# Patient Record
Sex: Male | Born: 1950 | Race: White | Hispanic: No | Marital: Married | Smoking: Never smoker
Health system: Southern US, Community
[De-identification: ages and names within clinical notes are randomized; demographics above are authoritative.]

## PROBLEM LIST (undated history)

## (undated) DIAGNOSIS — Z86010 Personal history of colonic polyps: Secondary | ICD-10-CM

## (undated) DIAGNOSIS — E119 Type 2 diabetes mellitus without complications: Secondary | ICD-10-CM

## (undated) DIAGNOSIS — I4891 Unspecified atrial fibrillation: Secondary | ICD-10-CM

## (undated) DIAGNOSIS — I2510 Atherosclerotic heart disease of native coronary artery without angina pectoris: Secondary | ICD-10-CM

## (undated) DIAGNOSIS — M1990 Unspecified osteoarthritis, unspecified site: Secondary | ICD-10-CM

## (undated) DIAGNOSIS — J301 Allergic rhinitis due to pollen: Secondary | ICD-10-CM

## (undated) DIAGNOSIS — Z860101 Personal history of adenomatous and serrated colon polyps: Secondary | ICD-10-CM

## (undated) DIAGNOSIS — K5790 Diverticulosis of intestine, part unspecified, without perforation or abscess without bleeding: Secondary | ICD-10-CM

## (undated) DIAGNOSIS — K648 Other hemorrhoids: Secondary | ICD-10-CM

## (undated) DIAGNOSIS — E669 Obesity, unspecified: Secondary | ICD-10-CM

## (undated) DIAGNOSIS — C801 Malignant (primary) neoplasm, unspecified: Secondary | ICD-10-CM

## (undated) DIAGNOSIS — I10 Essential (primary) hypertension: Secondary | ICD-10-CM

## (undated) DIAGNOSIS — E66812 Obesity, class 2: Secondary | ICD-10-CM

## (undated) DIAGNOSIS — E785 Hyperlipidemia, unspecified: Secondary | ICD-10-CM

## (undated) DIAGNOSIS — T7840XA Allergy, unspecified, initial encounter: Secondary | ICD-10-CM

## (undated) DIAGNOSIS — M170 Bilateral primary osteoarthritis of knee: Secondary | ICD-10-CM

## (undated) DIAGNOSIS — K219 Gastro-esophageal reflux disease without esophagitis: Secondary | ICD-10-CM

## (undated) DIAGNOSIS — Z85828 Personal history of other malignant neoplasm of skin: Secondary | ICD-10-CM

## (undated) DIAGNOSIS — N390 Urinary tract infection, site not specified: Secondary | ICD-10-CM

## (undated) HISTORY — PX: COLONOSCOPY: SHX174

## (undated) HISTORY — DX: Bilateral primary osteoarthritis of knee: M17.0

## (undated) HISTORY — PX: TRANSTHORACIC ECHOCARDIOGRAM: SHX275

## (undated) HISTORY — PX: POLYPECTOMY: SHX149

## (undated) HISTORY — DX: Allergic rhinitis due to pollen: J30.1

## (undated) HISTORY — DX: Atherosclerotic heart disease of native coronary artery without angina pectoris: I25.10

## (undated) HISTORY — DX: Unspecified atrial fibrillation: I48.91

## (undated) HISTORY — DX: Hyperlipidemia, unspecified: E78.5

## (undated) HISTORY — DX: Essential (primary) hypertension: I10

## (undated) HISTORY — DX: Unspecified osteoarthritis, unspecified site: M19.90

## (undated) HISTORY — DX: Gastro-esophageal reflux disease without esophagitis: K21.9

## (undated) HISTORY — DX: Obesity, class 2: E66.812

## (undated) HISTORY — PX: COLONOSCOPY W/ POLYPECTOMY: SHX1380

## (undated) HISTORY — DX: Malignant (primary) neoplasm, unspecified: C80.1

## (undated) HISTORY — DX: Urinary tract infection, site not specified: N39.0

## (undated) HISTORY — DX: Type 2 diabetes mellitus without complications: E11.9

## (undated) HISTORY — PX: OTHER SURGICAL HISTORY: SHX169

## (undated) HISTORY — DX: Obesity, unspecified: E66.9

## (undated) HISTORY — DX: Personal history of other malignant neoplasm of skin: Z85.828

## (undated) HISTORY — DX: Personal history of adenomatous and serrated colon polyps: Z86.0101

## (undated) HISTORY — DX: Other hemorrhoids: K64.8

## (undated) HISTORY — DX: Allergy, unspecified, initial encounter: T78.40XA

## (undated) HISTORY — DX: Personal history of colonic polyps: Z86.010

## (undated) HISTORY — PX: TONSILLECTOMY: SUR1361

## (undated) HISTORY — DX: Diverticulosis of intestine, part unspecified, without perforation or abscess without bleeding: K57.90

---

## 1999-03-01 ENCOUNTER — Encounter: Payer: Self-pay | Admitting: Family Medicine

## 1999-03-01 ENCOUNTER — Encounter: Admission: RE | Admit: 1999-03-01 | Discharge: 1999-03-01 | Payer: Self-pay | Admitting: Family Medicine

## 2004-02-15 ENCOUNTER — Ambulatory Visit: Payer: Self-pay | Admitting: Family Medicine

## 2004-08-23 ENCOUNTER — Ambulatory Visit: Payer: Self-pay | Admitting: Family Medicine

## 2004-09-05 ENCOUNTER — Ambulatory Visit: Payer: Self-pay | Admitting: Family Medicine

## 2004-09-08 ENCOUNTER — Ambulatory Visit: Payer: Self-pay

## 2005-03-02 ENCOUNTER — Emergency Department (HOSPITAL_COMMUNITY): Admission: EM | Admit: 2005-03-02 | Discharge: 2005-03-03 | Payer: Self-pay | Admitting: Emergency Medicine

## 2005-04-06 ENCOUNTER — Ambulatory Visit: Payer: Self-pay | Admitting: Internal Medicine

## 2006-04-20 ENCOUNTER — Ambulatory Visit: Payer: Self-pay | Admitting: Internal Medicine

## 2006-09-14 ENCOUNTER — Ambulatory Visit: Payer: Self-pay | Admitting: Internal Medicine

## 2006-10-01 ENCOUNTER — Ambulatory Visit: Payer: Self-pay | Admitting: Family Medicine

## 2006-10-01 DIAGNOSIS — K5732 Diverticulitis of large intestine without perforation or abscess without bleeding: Secondary | ICD-10-CM

## 2006-10-02 DIAGNOSIS — L851 Acquired keratosis [keratoderma] palmaris et plantaris: Secondary | ICD-10-CM

## 2006-10-21 ENCOUNTER — Ambulatory Visit: Payer: Self-pay | Admitting: Family Medicine

## 2006-10-21 LAB — CONVERTED CEMR LAB
Bilirubin Urine: NEGATIVE
Blood in Urine, dipstick: NEGATIVE
Glucose, Urine, Semiquant: NEGATIVE
Ketones, urine, test strip: NEGATIVE
Nitrite: NEGATIVE
Protein, U semiquant: NEGATIVE
Specific Gravity, Urine: >=1.03
Urobilinogen, UA: 0.2
WBC Urine, dipstick: NEGATIVE
pH: 5

## 2006-10-30 ENCOUNTER — Ambulatory Visit: Payer: Self-pay | Admitting: Family Medicine

## 2006-10-30 DIAGNOSIS — J309 Allergic rhinitis, unspecified: Secondary | ICD-10-CM | POA: Insufficient documentation

## 2006-10-30 LAB — CONVERTED CEMR LAB
ALT: 28 units/L (ref 0–53)
AST: 26 units/L (ref 0–37)
Albumin: 3.8 g/dL (ref 3.5–5.2)
Alkaline Phosphatase: 62 units/L (ref 39–117)
BUN: 17 mg/dL (ref 6–23)
Basophils Absolute: 0 10*3/uL (ref 0.0–0.1)
Basophils Relative: 0.8 % (ref 0.0–1.0)
Bilirubin, Direct: 0.1 mg/dL (ref 0.0–0.3)
CO2: 28 meq/L (ref 19–32)
Calcium: 9.2 mg/dL (ref 8.4–10.5)
Chloride: 109 meq/L (ref 96–112)
Cholesterol: 142 mg/dL (ref 0–200)
Creatinine, Ser: 1.1 mg/dL (ref 0.4–1.5)
Eosinophils Absolute: 0.2 10*3/uL (ref 0.0–0.6)
Eosinophils Relative: 2.6 % (ref 0.0–5.0)
GFR calc Af Amer: 89 mL/min
GFR calc non Af Amer: 73 mL/min
Glucose, Bld: 102 mg/dL — ABNORMAL HIGH (ref 70–99)
HCT: 42.5 % (ref 39.0–52.0)
HDL: 25.2 mg/dL — ABNORMAL LOW (ref >39.0)
Hemoglobin: 14.7 g/dL (ref 13.0–17.0)
LDL Cholesterol: 91 mg/dL (ref 0–99)
Lymphocytes Relative: 40.1 % (ref 12.0–46.0)
MCHC: 34.5 g/dL (ref 30.0–36.0)
MCV: 91.6 fL (ref 78.0–100.0)
Monocytes Absolute: 0.7 10*3/uL (ref 0.2–0.7)
Monocytes Relative: 11.5 % — ABNORMAL HIGH (ref 3.0–11.0)
Neutro Abs: 2.8 10*3/uL (ref 1.4–7.7)
Neutrophils Relative %: 45 % (ref 43.0–77.0)
PSA: 0.65 ng/mL (ref 0.10–4.00)
Platelets: 188 10*3/uL (ref 150–400)
Potassium: 3.9 meq/L (ref 3.5–5.1)
RBC: 4.65 M/uL (ref 4.22–5.81)
RDW: 12.2 % (ref 11.5–14.6)
Sodium: 143 meq/L (ref 135–145)
TSH: 3.08 microintl units/mL (ref 0.35–5.50)
Total Bilirubin: 0.7 mg/dL (ref 0.3–1.2)
Total CHOL/HDL Ratio: 5.6
Total Protein: 6.4 g/dL (ref 6.0–8.3)
Triglycerides: 127 mg/dL (ref 0–149)
VLDL: 25 mg/dL (ref 0–40)
WBC: 6.1 10*3/uL (ref 4.5–10.5)

## 2007-03-05 ENCOUNTER — Encounter: Payer: Self-pay | Admitting: Family Medicine

## 2007-03-29 ENCOUNTER — Ambulatory Visit: Payer: Self-pay | Admitting: Family Medicine

## 2007-08-06 ENCOUNTER — Telehealth: Payer: Self-pay | Admitting: Family Medicine

## 2007-08-07 ENCOUNTER — Ambulatory Visit: Payer: Self-pay | Admitting: Family Medicine

## 2007-08-07 DIAGNOSIS — K645 Perianal venous thrombosis: Secondary | ICD-10-CM | POA: Insufficient documentation

## 2008-01-12 ENCOUNTER — Ambulatory Visit: Payer: Self-pay | Admitting: Family Medicine

## 2008-01-12 LAB — CONVERTED CEMR LAB
Bilirubin Urine: NEGATIVE
Blood in Urine, dipstick: NEGATIVE
Glucose, Urine, Semiquant: NEGATIVE
Ketones, urine, test strip: NEGATIVE
Nitrite: NEGATIVE
Protein, U semiquant: NEGATIVE
Specific Gravity, Urine: 1.015
Urobilinogen, UA: 0.2
WBC Urine, dipstick: NEGATIVE
pH: 6

## 2008-01-20 ENCOUNTER — Ambulatory Visit: Payer: Self-pay | Admitting: Family Medicine

## 2008-01-20 DIAGNOSIS — M129 Arthropathy, unspecified: Secondary | ICD-10-CM | POA: Insufficient documentation

## 2008-01-20 LAB — CONVERTED CEMR LAB
ALT: 25 units/L (ref 0–53)
AST: 29 units/L (ref 0–37)
Albumin: 4.1 g/dL (ref 3.5–5.2)
Alkaline Phosphatase: 57 units/L (ref 39–117)
BUN: 20 mg/dL (ref 6–23)
Basophils Absolute: 0 10*3/uL (ref 0.0–0.1)
Basophils Relative: 0.6 % (ref 0.0–3.0)
Bilirubin, Direct: 0.1 mg/dL (ref 0.0–0.3)
CO2: 28 meq/L (ref 19–32)
Calcium: 9 mg/dL (ref 8.4–10.5)
Chloride: 106 meq/L (ref 96–112)
Cholesterol: 150 mg/dL (ref 0–200)
Creatinine, Ser: 0.9 mg/dL (ref 0.4–1.5)
Eosinophils Absolute: 0.2 10*3/uL (ref 0.0–0.7)
Eosinophils Relative: 3.3 % (ref 0.0–5.0)
GFR calc Af Amer: 111 mL/min
GFR calc non Af Amer: 92 mL/min
Glucose, Bld: 103 mg/dL — ABNORMAL HIGH (ref 70–99)
HCT: 44.5 % (ref 39.0–52.0)
HDL: 29.6 mg/dL — ABNORMAL LOW (ref >39.0)
Hemoglobin: 15.5 g/dL (ref 13.0–17.0)
LDL Cholesterol: 90 mg/dL (ref 0–99)
Lymphocytes Relative: 34.9 % (ref 12.0–46.0)
MCHC: 34.9 g/dL (ref 30.0–36.0)
MCV: 92.8 fL (ref 78.0–100.0)
Monocytes Absolute: 0.7 10*3/uL (ref 0.1–1.0)
Monocytes Relative: 11.1 % (ref 3.0–12.0)
Neutro Abs: 3.3 10*3/uL (ref 1.4–7.7)
Neutrophils Relative %: 50.1 % (ref 43.0–77.0)
PSA: 0.56 ng/mL (ref 0.10–4.00)
Platelets: 176 10*3/uL (ref 150–400)
Potassium: 3.3 meq/L — ABNORMAL LOW (ref 3.5–5.1)
RBC: 4.79 M/uL (ref 4.22–5.81)
RDW: 12.6 % (ref 11.5–14.6)
Sodium: 140 meq/L (ref 135–145)
TSH: 3.33 microintl units/mL (ref 0.35–5.50)
Total Bilirubin: 0.9 mg/dL (ref 0.3–1.2)
Total CHOL/HDL Ratio: 5.1
Total Protein: 7.3 g/dL (ref 6.0–8.3)
Triglycerides: 152 mg/dL — ABNORMAL HIGH (ref 0–149)
VLDL: 30 mg/dL (ref 0–40)
WBC: 6.4 10*3/uL (ref 4.5–10.5)

## 2008-05-21 ENCOUNTER — Encounter (INDEPENDENT_AMBULATORY_CARE_PROVIDER_SITE_OTHER): Payer: Self-pay | Admitting: *Deleted

## 2009-01-19 ENCOUNTER — Ambulatory Visit: Payer: Self-pay | Admitting: Family Medicine

## 2009-01-19 DIAGNOSIS — J209 Acute bronchitis, unspecified: Secondary | ICD-10-CM | POA: Insufficient documentation

## 2009-01-19 DIAGNOSIS — J029 Acute pharyngitis, unspecified: Secondary | ICD-10-CM

## 2009-01-19 DIAGNOSIS — J0100 Acute maxillary sinusitis, unspecified: Secondary | ICD-10-CM

## 2009-01-24 ENCOUNTER — Telehealth: Payer: Self-pay | Admitting: Family Medicine

## 2009-03-17 ENCOUNTER — Telehealth: Payer: Self-pay | Admitting: Gastroenterology

## 2009-04-01 ENCOUNTER — Telehealth: Payer: Self-pay | Admitting: Family Medicine

## 2009-04-12 ENCOUNTER — Ambulatory Visit: Payer: Self-pay | Admitting: Family Medicine

## 2010-03-28 NOTE — Progress Notes (Signed)
Summary: Schedule Colonoscopy  Phone Note Outgoing Call Call back at [facility] Phone [phone]   Call placed by: Harlow Mares CMA Duncan Dull),  March 17, 2009 3:19 PM Call placed to:  Patient Summary of Call: Left message on patients machine to call back. Initial call taken by: Harlow Mares CMA Duncan Dull),  March 17, 2009 3:19 PM  Follow-up for Phone Call        Left message on patients machine to call back.  Follow-up by: Harlow Mares CMA Duncan Dull),  March 22, 2009 2:08 PM  Additional Follow-up for Phone Call Additional follow up Details #1::        Left message on patients machine to call back.  Additional Follow-up by: Harlow Mares CMA Duncan Dull),  March 30, 2009 3:33 PM

## 2010-03-28 NOTE — Assessment & Plan Note (Signed)
Summary: ZOSTAVAX INJ // RS---PT [facility] // RS  Nurse Visit   Allergies: No Known Drug Allergies  Immunizations Administered:  Zostavax # 1:    Vaccine Type: Zostavax    Site: left deltoid    Mfr: Merck    Dose: 0.5 ml    Route: [place]    Given by: Pura Spice, RN    Exp. Date: 03/16/2010    Lot #: [id number]  Orders Added: 1)  Zoster (Shingles) Vaccine Live [90736] 2)  Admin 1st Vaccine [16109]

## 2010-03-28 NOTE — Progress Notes (Signed)
Summary: SHINGLES VACCINE (ZOSTAVAX) QUESTION  Phone Note Call from Patient   Caller: Patient [phone] Reason for Call: Talk to Nurse, Talk to Doctor Summary of Call: Pt called to inquire about shingles vaccine (Zostavax).... If not available at [facility] then he would like to come by and p/u RX for same so that he can obtain vaccination at [facility].... Pt would like to be contacted to advise @ [phone].  Initial call taken by: Debbra Riding,  April 01, 2009 11:58 AM  Follow-up for Phone Call        Pt can get shot here if her insurance covers it or not he will ned to call them.  If not then he needs to pay for it up front. Follow-up by: Alfred Levins, CMA,  April 05, 2009 8:23 AM  Additional Follow-up for Phone Call Additional follow up Details #1::        Pt wants to make sure that the Zostavax vaccine will be available if OV is scheduled.... Can you advise? Additional Follow-up by: Debbra Riding,  April 05, 2009 9:26 AM    Additional Follow-up for Phone Call Additional follow up Details #2::    yes we have plenty Follow-up by: Alfred Levins, CMA,  April 05, 2009 10:00 AM  Additional Follow-up for Phone Call Additional follow up Details #3:: Details for Additional Follow-up Action Taken: Attempted to call pt to schedule appt for inj.... LMTCB so appt can be scheduled..... Debbra Riding, April 05, 2009 12:24PM  Pt wife came in for appt, adv that she is going to do some checking and she will c/b... pt / pts wife understands cost of $248 plus fee for administering same.... Pt may ck w/ [facility] to see if it may be cheaper... Will c/b to advise...Marland KitchenMarland KitchenDebbra Riding, February 8, 12:29PM  Additional Follow-up by: Debbra Riding,  April 05, 2009 12:25 PM   Appended Document: SHINGLES VACCINE (ZOSTAVAX) QUESTION called and instructed to come to our office

## 2010-07-05 ENCOUNTER — Other Ambulatory Visit (INDEPENDENT_AMBULATORY_CARE_PROVIDER_SITE_OTHER): Payer: 59

## 2010-07-05 DIAGNOSIS — Z0389 Encounter for observation for other suspected diseases and conditions ruled out: Secondary | ICD-10-CM

## 2010-07-05 DIAGNOSIS — Z0000 Encounter for general adult medical examination without abnormal findings: Secondary | ICD-10-CM

## 2010-07-05 LAB — HEPATIC FUNCTION PANEL
ALT: 16 U/L (ref 0–53)
AST: 24 U/L (ref 0–37)
Bilirubin, Direct: 0.1 mg/dL (ref 0.0–0.3)
Total Bilirubin: 0.9 mg/dL (ref 0.3–1.2)
Total Protein: 6.9 g/dL (ref 6.0–8.3)

## 2010-07-05 LAB — URINALYSIS
Bilirubin Urine: NEGATIVE
Ketones, ur: NEGATIVE
Nitrite: NEGATIVE
Total Protein, Urine: NEGATIVE
Urine Glucose: NEGATIVE
pH: 5.5 (ref 5.0–8.0)

## 2010-07-05 LAB — CBC WITH DIFFERENTIAL/PLATELET
Basophils Relative: 0.5 % (ref 0.0–3.0)
Eosinophils Absolute: 0.1 10*3/uL (ref 0.0–0.7)
Eosinophils Relative: 1.9 % (ref 0.0–5.0)
HCT: 42.6 % (ref 39.0–52.0)
Hemoglobin: 14.8 g/dL (ref 13.0–17.0)
Lymphs Abs: 2.3 10*3/uL (ref 0.7–4.0)
MCHC: 34.8 g/dL (ref 30.0–36.0)
MCV: 93.1 fl (ref 78.0–100.0)
Monocytes Absolute: 0.8 10*3/uL (ref 0.1–1.0)
Neutro Abs: 4.1 10*3/uL (ref 1.4–7.7)
RBC: 4.58 Mil/uL (ref 4.22–5.81)
WBC: 7.4 10*3/uL (ref 4.5–10.5)

## 2010-07-05 LAB — LIPID PANEL
LDL Cholesterol: 96 mg/dL (ref 0–99)
Total CHOL/HDL Ratio: 5
Triglycerides: 114 mg/dL (ref 0.0–149.0)

## 2010-07-05 LAB — BASIC METABOLIC PANEL
CO2: 27 mEq/L (ref 19–32)
Chloride: 103 mEq/L (ref 96–112)
Creatinine, Ser: 1 mg/dL (ref 0.4–1.5)
Potassium: 3.9 mEq/L (ref 3.5–5.1)

## 2010-07-13 ENCOUNTER — Encounter: Payer: Self-pay | Admitting: Family Medicine

## 2010-07-13 ENCOUNTER — Ambulatory Visit (INDEPENDENT_AMBULATORY_CARE_PROVIDER_SITE_OTHER): Payer: 59 | Admitting: Family Medicine

## 2010-07-13 VITALS — BP 138/84 | HR 68 | Temp 98.1°F | Ht 65.5 in | Wt 223.0 lb

## 2010-07-13 DIAGNOSIS — E669 Obesity, unspecified: Secondary | ICD-10-CM

## 2010-07-13 DIAGNOSIS — E6609 Other obesity due to excess calories: Secondary | ICD-10-CM

## 2010-07-13 DIAGNOSIS — R0609 Other forms of dyspnea: Secondary | ICD-10-CM

## 2010-07-13 DIAGNOSIS — S86911A Strain of unspecified muscle(s) and tendon(s) at lower leg level, right leg, initial encounter: Secondary | ICD-10-CM

## 2010-07-13 DIAGNOSIS — Z13828 Encounter for screening for other musculoskeletal disorder: Secondary | ICD-10-CM

## 2010-07-13 DIAGNOSIS — IMO0002 Reserved for concepts with insufficient information to code with codable children: Secondary | ICD-10-CM

## 2010-07-13 DIAGNOSIS — Z0000 Encounter for general adult medical examination without abnormal findings: Secondary | ICD-10-CM

## 2010-07-13 MED ORDER — DICLOFENAC SODIUM 75 MG PO TBEC: 75.0000 mg | DELAYED_RELEASE_TABLET | Freq: Two times a day (BID) | ORAL | Status: DC

## 2010-07-13 MED ORDER — PHENTERMINE HCL 37.5 MG PO CAPS: 37.5000 mg | ORAL_CAPSULE | ORAL | Status: AC

## 2010-07-13 NOTE — Patient Instructions (Addendum)
You are doingvery well, except you need to lose weight, join your wife on weight watchers diet And take phentermine each am to decrease appetite Take diclofenac 75mg  twice daily for injured knee Will schedule stress test You schedule colonoscopic exam Very pleased that your laboratory studies were good

## 2010-07-13 NOTE — Progress Notes (Signed)
  Subjective:    Patient ID: Michael Hodge, male    DOB: 1950-07-23, 60 y.o.   MRN: [id number] This 60 year old white married male is in for his yearly physical examination. He relates in general he had been doing very well has been active he injured his right leg at the knee and right above-the-knee level and has caused some problem over the past 6-8  Months Continues to have some problem with allergic rhinitis and uses Zyrtec and Sudafed He has had episodes of exertional dyspnea but no chest pain and is of concern as far as having a stress test He is due to schedule a colonoscopic examination He is concerned about his weight since he does have a family history of diabetes and would like some help at all possible relating his wife he is on Weight Watchers HPI    Review of Systems  Constitutional: Negative.   HENT: Positive for congestion, rhinorrhea and postnasal drip.   Eyes: Negative.   Respiratory: Negative.   Cardiovascular: Negative.        Has had several episodes of exertional dyspnea without any lung symptoms or problems  Gastrointestinal: Negative.   Genitourinary: Negative.   Musculoskeletal: Positive for joint swelling and arthralgias.  Skin: Negative.   Hematological: Negative.   Psychiatric/Behavioral: Negative.        Objective:   Physical Exam the patient is a well-developed overweight white male who does not appear to be in any distress her pleasant and cooperative HEENT reveals boggy pale nasal mucosa with slight clear drainage no other abnormalities carotid pulses are good thyroid normal Lungs clear to palpation percussion and auscultation no rales are heard no wheezing Heart no cardiomegaly heart sounds  Are good without murmurs regular rhythm electrocardiogram normal Abdomen liver and kidneys are nonpalpable aorta percusses to normal no tenderness Genitalia normal testicles normal Rectal examination negative review and prostate be normal size no nodules no  tenderness Extremities in the right knee and above the right knee on the lateral last back there is tenderness of the tendon insertion of the quadricep also some muscular tenderness Neurological examination negative Skin no abnormalities        Assessment & Plan:  Physical examination reveals a healthy overweight male Allergic rhinitis continue Zyrtec and Sudafed when needed Acute strain and injury to her right knee and leg start diclofenac 75 mg b.i.d. Exertional dyspnea despite negative normal EKG recommend treadmill stress test 2 schedule colonoscopic exam

## 2010-07-14 NOTE — Assessment & Plan Note (Signed)
[facility]                                 ON-CALL NOTE   NAME:MUSEOgden, Handlin                          MRN:          [id number]  DATE:04/20/2006                            DOB:          Jul 19, 1950    Phone number [phone], phone call about 10:03 a.m.  Patient of Dr.  Charmian Muff.  He has been having coughing and fever, so he was given an  appointment to come in to the Saturday clinic.     Karie Schwalbe, MD  Electronically Signed    RIL/MedQ  DD: 04/20/2006  DT: 04/20/2006  Job #: [id number]   cc:   Ellin Saba., MD

## 2010-07-19 ENCOUNTER — Encounter (HOSPITAL_COMMUNITY): Payer: 59 | Admitting: Radiology

## 2010-07-26 ENCOUNTER — Ambulatory Visit (INDEPENDENT_AMBULATORY_CARE_PROVIDER_SITE_OTHER): Payer: 59 | Admitting: Family Medicine

## 2010-07-26 ENCOUNTER — Encounter: Payer: 59 | Admitting: Physician Assistant

## 2010-07-26 ENCOUNTER — Encounter: Payer: Self-pay | Admitting: Family Medicine

## 2010-07-26 VITALS — BP 172/88 | HR 75 | Temp 98.0°F | Wt 223.0 lb

## 2010-07-26 DIAGNOSIS — N410 Acute prostatitis: Secondary | ICD-10-CM

## 2010-07-26 DIAGNOSIS — R350 Frequency of micturition: Secondary | ICD-10-CM

## 2010-07-26 LAB — POCT URINALYSIS DIPSTICK
Blood, UA: NEGATIVE
Spec Grav, UA: 1.03
Urobilinogen, UA: 1

## 2010-07-26 MED ORDER — CIPROFLOXACIN HCL 500 MG PO TABS: 500.0000 mg | ORAL_TABLET | Freq: Two times a day (BID) | ORAL | Status: AC

## 2010-07-26 NOTE — Progress Notes (Signed)
  Subjective:    Patient ID: Michael Hodge, male    DOB: [birth year], 60 y.o.   MRN: [id number] This 60 yr old white male noticed bloody then brown semen 1 week ago then having nocturia x2,burning and increased frequency over past 4-5 days. No other symptoms was examined 2 weeks ago and prostate and urine negative HPI    Review of SystemsSee HPI     Objective:   Physical Exam Rectal exam reveals prostate1 1/2 normal size and very tender and painful on examination, no discharge       Assessment & Plan:  Acute prostatitis to tx for 1 month and reexamine pt, decrease caffeine

## 2010-07-26 NOTE — Patient Instructions (Signed)
You have acute prostatis to treat with cipro 500 mg bid for 1 month Return for examination in 1 month

## 2010-08-04 ENCOUNTER — Encounter: Payer: Self-pay | Admitting: Physician Assistant

## 2010-08-07 ENCOUNTER — Ambulatory Visit (INDEPENDENT_AMBULATORY_CARE_PROVIDER_SITE_OTHER): Payer: 59 | Admitting: Physician Assistant

## 2010-08-07 ENCOUNTER — Telehealth: Payer: Self-pay | Admitting: *Deleted

## 2010-08-07 ENCOUNTER — Ambulatory Visit (AMBULATORY_SURGERY_CENTER): Payer: 59 | Admitting: *Deleted

## 2010-08-07 ENCOUNTER — Encounter: Payer: Self-pay | Admitting: Physician Assistant

## 2010-08-07 VITALS — Ht 66.0 in | Wt 222.5 lb

## 2010-08-07 DIAGNOSIS — Z1211 Encounter for screening for malignant neoplasm of colon: Secondary | ICD-10-CM

## 2010-08-07 DIAGNOSIS — R0602 Shortness of breath: Secondary | ICD-10-CM

## 2010-08-07 DIAGNOSIS — R9439 Abnormal result of other cardiovascular function study: Secondary | ICD-10-CM

## 2010-08-07 MED ORDER — PEG-KCL-NACL-NASULF-NA ASC-C 100 G PO SOLR: ORAL | Status: DC

## 2010-08-07 NOTE — Patient Instructions (Signed)
You have been referred to HAVE A CARDIAC CT ANGIOGRAPHY @ [place] FOR ABNORMAL STRESS TEST AND SHORTNESS OF BREATH.

## 2010-08-07 NOTE — Telephone Encounter (Signed)
Yes, by Dr. Russella Dar. Says recall 2010. Pt has not seen anyone here since Dr. Doreatha Martin. Appointment was made w/ you. Michael Hodge

## 2010-08-07 NOTE — Progress Notes (Signed)
   Exercise Treadmill Test  Pre-Exercise Testing Evaluation Rhythm: sinus bradycardia  Rate: 58   PR:  .16 QRS:  .09  QT:  .46 QTc: .45     Test  Exercise Tolerance Test Ordering MD: Rickard Patience M.D  Interpreting MD:  Tereso Newcomer PA-C  Unique Test No: 1  Treadmill:  1  Indication for ETT: exertional dyspnea  Contraindication to ETT: No   Stress Modality: exercise - treadmill  Cardiac Imaging Performed: non   Protocol: standard Bruce - maximal  Max BP:  220/88  Max MPHR (bpm):  159 85% MPR (bpm):  135  MPHR obtained (bpm): 157 % MPHR obtained: 97  Reached 85% MPHR (min:sec):  3:27 Total Exercise Time (min-sec):6:00  Workload in METS:  8.5 Borg Scale: 15  Reason ETT Terminated:  desired heart rate attained    ST Segment Analysis At Rest: normal ST segments - no evidence of significant ST depression With Exercise: significant ischemic ST depression  Other Information Arrhythmia:  No Angina during ETT:  absent (0) Quality of ETT:  diagnostic  ETT Interpretation:  abnormal - evidence of ST depression consistent with ischemia  Comments: Fair exercise tolerance. Normal BP response to exercise. No chest pain. 1-2 mm ST depression in inferolateral leads.  Recommendations: Discussed with Dr. Antoine Poche and reviewed ECGs.  Discussed with patient regarding whether to proceed with cardiac CT angio vs. Cardiac cath.  Risks and benefits of both tests reviewed with patient. He prefers the CT angiography. Denies allergy to IV dye. Will arrange at his convenience in next couple of weeks. Consider BP treatment.

## 2010-08-07 NOTE — Telephone Encounter (Signed)
In that case, I would just keep his recommendation as he has reviewed that chart and make that decision.

## 2010-08-07 NOTE — Telephone Encounter (Signed)
Was the chart reviewed by a physician?

## 2010-08-07 NOTE — Telephone Encounter (Signed)
Dr Marina Goodell- Mr [place] had screening colonoscopy w/ Dr. Doreatha Martin in 2005.  Findings were diverticulosis. Report says recall in 5 years.  No family history of colon cancer or colon polyps.  Pt is not having any problems currently.  He did have diverticulitis 3 years ago. Is he due for colonoscopy now or 2015? Michael Hodge

## 2010-08-07 NOTE — Telephone Encounter (Signed)
It would be best if you check with Dr. Russella Dar then, when he returns next week. There may have been mitigating reasons why he elected to keep the five-year followup. Thanks for helping

## 2010-08-07 NOTE — Telephone Encounter (Signed)
Pt. will be leaving to go out of town next week and will not return until the day he needs to begin prep.  Could I put chart on your desk for review? Michael Hodge

## 2010-08-08 ENCOUNTER — Encounter: Payer: Self-pay | Admitting: *Deleted

## 2010-08-08 ENCOUNTER — Other Ambulatory Visit: Payer: Self-pay | Admitting: Physician Assistant

## 2010-08-08 ENCOUNTER — Other Ambulatory Visit: Payer: Self-pay | Admitting: Cardiology

## 2010-08-08 DIAGNOSIS — R0602 Shortness of breath: Secondary | ICD-10-CM

## 2010-08-08 NOTE — Progress Notes (Signed)
Addended by: Tarri Fuller on: 08/08/2010 03:03 PM   Modules accepted: Orders

## 2010-08-09 ENCOUNTER — Encounter: Payer: Self-pay | Admitting: Cardiology

## 2010-08-17 ENCOUNTER — Ambulatory Visit (HOSPITAL_COMMUNITY)
Admission: RE | Admit: 2010-08-17 | Discharge: 2010-08-17 | Disposition: A | Discharge status: Home / Self Care | Payer: 59 | Source: Ambulatory Visit | Attending: Cardiology | Admitting: Cardiology

## 2010-08-17 DIAGNOSIS — R0609 Other forms of dyspnea: Secondary | ICD-10-CM | POA: Insufficient documentation

## 2010-08-17 DIAGNOSIS — R0989 Other specified symptoms and signs involving the circulatory and respiratory systems: Secondary | ICD-10-CM | POA: Insufficient documentation

## 2010-08-17 DIAGNOSIS — R9430 Abnormal result of cardiovascular function study, unspecified: Secondary | ICD-10-CM

## 2010-08-17 DIAGNOSIS — R0602 Shortness of breath: Secondary | ICD-10-CM

## 2010-08-17 DIAGNOSIS — R9439 Abnormal result of other cardiovascular function study: Secondary | ICD-10-CM | POA: Insufficient documentation

## 2010-08-17 MED ORDER — IOHEXOL 350 MG/ML SOLN
80.0000 mL | Freq: Once | INTRAVENOUS | Status: AC | PRN
  Administered 2010-08-17: 80 mL via INTRAVENOUS

## 2010-08-21 ENCOUNTER — Encounter: Payer: Self-pay | Admitting: Internal Medicine

## 2010-08-21 ENCOUNTER — Ambulatory Visit (AMBULATORY_SURGERY_CENTER): Payer: 59 | Admitting: Internal Medicine

## 2010-08-21 VITALS — BP 143/86 | HR 71 | Resp 18 | Ht 67.0 in | Wt 219.0 lb

## 2010-08-21 DIAGNOSIS — K5730 Diverticulosis of large intestine without perforation or abscess without bleeding: Secondary | ICD-10-CM

## 2010-08-21 DIAGNOSIS — D126 Benign neoplasm of colon, unspecified: Secondary | ICD-10-CM

## 2010-08-21 DIAGNOSIS — Z1211 Encounter for screening for malignant neoplasm of colon: Secondary | ICD-10-CM

## 2010-08-21 MED ORDER — SODIUM CHLORIDE 0.9 % IV SOLN: 500.0000 mL | INTRAVENOUS | Status: DC

## 2010-08-21 NOTE — Patient Instructions (Signed)
Please read blue and green discharge instruction sheets 

## 2010-08-22 ENCOUNTER — Telehealth: Payer: Self-pay | Admitting: *Deleted

## 2010-08-22 NOTE — Telephone Encounter (Signed)

## 2010-08-25 ENCOUNTER — Telehealth: Payer: Self-pay | Admitting: *Deleted

## 2010-08-25 NOTE — Telephone Encounter (Signed)
Reviewed results with pt and a follow up appt has been scheduled for 7/13

## 2010-08-25 NOTE — Telephone Encounter (Signed)
Left message for pt to call back to discuss results of CTA and the need to schedule for an appt

## 2010-09-08 ENCOUNTER — Encounter: Payer: Self-pay | Admitting: Cardiology

## 2010-09-08 ENCOUNTER — Ambulatory Visit (INDEPENDENT_AMBULATORY_CARE_PROVIDER_SITE_OTHER): Payer: 59 | Admitting: Cardiology

## 2010-09-08 DIAGNOSIS — E669 Obesity, unspecified: Secondary | ICD-10-CM

## 2010-09-08 DIAGNOSIS — E6609 Other obesity due to excess calories: Secondary | ICD-10-CM

## 2010-09-08 DIAGNOSIS — I2510 Atherosclerotic heart disease of native coronary artery without angina pectoris: Secondary | ICD-10-CM | POA: Insufficient documentation

## 2010-09-08 DIAGNOSIS — E785 Hyperlipidemia, unspecified: Secondary | ICD-10-CM | POA: Insufficient documentation

## 2010-09-08 DIAGNOSIS — I10 Essential (primary) hypertension: Secondary | ICD-10-CM | POA: Insufficient documentation

## 2010-09-08 MED ORDER — HYDROCHLOROTHIAZIDE 12.5 MG PO CAPS: 12.5000 mg | ORAL_CAPSULE | Freq: Every day | ORAL | Status: DC

## 2010-09-08 NOTE — Assessment & Plan Note (Signed)
We discussed primary risk reduction at length.  No further testing is indicated although I will follow this with a coronary calcium score in one year.

## 2010-09-08 NOTE — Progress Notes (Signed)
HPI The patient presents for followup after an abnormal CT demonstrating some nonobstructive plaque mixed soft and calcified.  He had a borderline ETT leading to this.  The patient denies any new symptoms such as chest discomfort, neck or arm discomfort. There has been no new shortness of breath, PND or orthopnea. There have been no reported palpitations, presyncope or syncope.  He is limited by knee pain but is able to do some walking.  No Known Allergies  Current Outpatient Prescriptions  Medication Sig Dispense Refill  . aspirin 81 MG tablet Take 81 mg by mouth daily.        . cetirizine (ZYRTEC) 10 MG tablet Take 10 mg by mouth as needed.       . diclofenac (VOLTAREN) 75 MG EC tablet Take 1 tablet (75 mg total) by mouth 2 (two) times daily with a meal. For inflamation  60 tablet  11   Current Facility-Administered Medications  Medication Dose Route Frequency Provider Last Rate Last Dose  . DISCONTD: 0.9 %  sodium chloride infusion  500 mL Intravenous Continuous Yancey Flemings, MD        Past Medical History  Diagnosis Date  . DTaP/IPV/HBV vaccination 2009  . Hx of colonoscopy 2005/ repeat 2010  . Allergy   . Arthritis     Past Surgical History  Procedure Date  . Remvoal sebaceous cyst 15-20 years ago  . Tonsillectomy     ROS:  As stated in the HPI and negative for all other systems.  PHYSICAL EXAM BP 166/100  Pulse 60  Resp 18  Ht 5\' 7"  (1.702 m)  Wt 223 lb 1.9 oz (101.207 kg)  BMI 34.95 kg/m2 GENERAL:  Well appearing NECK:  No jugular venous distention, waveform within normal limits, carotid upstroke brisk and symmetric, no bruits, no thyromegaly LYMPHATICS:  No cervical, inguinal adenopathy LUNGS:  Clear to auscultation bilaterally BACK:  No CVA tenderness CHEST:  Unremarkable HEART:  PMI not displaced or sustained,S1 and S2 within normal limits, no S3, no S4, no clicks, no rubs, no murmurs ABD:  Flat, positive bowel sounds normal in frequency in pitch, no bruits, no  rebound, no guarding, no midline pulsatile mass, no hepatomegaly, no splenomegaly, obese EXT:  2 plus pulses throughout, no edema, no cyanosis no clubbing SKIN:  No rashes no nodules NEURO:  Cranial nerves II through XII grossly intact, motor grossly intact throughout PSYCH:  Cognitively intact, oriented to person place and time  ASSESSMENT AND PLAN

## 2010-09-08 NOTE — Patient Instructions (Signed)
Please start Hydrochlorothiazide 12.5 mg a day.  Continue all other medications as listed.  Please come fasting for blood work a few days before your appointment.  Follow up in 2 months.

## 2010-09-08 NOTE — Assessment & Plan Note (Signed)
His HDL was 33.3 with an LDL of 96. At this point data supports lifestyle changes although I discussed the possibility of statins. We will pursue diet and exercise and I will repeat a lipid prior to the next appointment.

## 2010-09-08 NOTE — Assessment & Plan Note (Signed)
The patient understands the need to lose weight with diet and exercise. We have discussed specific strategies for this.  

## 2010-09-08 NOTE — Assessment & Plan Note (Signed)
His blood pressure is not controlled. He had an accelerated blood pressure response with his treadmill. I will start HCTZ 12.5 mg.  I have instructed the patient to record a blood pressure diary and recording this. This will be presented for my review and pending these results I will make further suggestions about changes in therapy for optimal blood pressure control.

## 2010-11-07 ENCOUNTER — Encounter: Payer: Self-pay | Admitting: *Deleted

## 2010-11-07 ENCOUNTER — Other Ambulatory Visit (INDEPENDENT_AMBULATORY_CARE_PROVIDER_SITE_OTHER): Payer: 59

## 2010-11-07 DIAGNOSIS — E785 Hyperlipidemia, unspecified: Secondary | ICD-10-CM

## 2010-11-07 LAB — LIPID PANEL
Cholesterol: 166 mg/dL (ref 0–200)
LDL Cholesterol: 96 mg/dL (ref 0–99)

## 2010-11-10 ENCOUNTER — Encounter: Payer: Self-pay | Admitting: Cardiology

## 2010-11-10 ENCOUNTER — Ambulatory Visit (INDEPENDENT_AMBULATORY_CARE_PROVIDER_SITE_OTHER): Payer: 59 | Admitting: Cardiology

## 2010-11-10 DIAGNOSIS — I2510 Atherosclerotic heart disease of native coronary artery without angina pectoris: Secondary | ICD-10-CM

## 2010-11-10 DIAGNOSIS — E785 Hyperlipidemia, unspecified: Secondary | ICD-10-CM

## 2010-11-10 DIAGNOSIS — E669 Obesity, unspecified: Secondary | ICD-10-CM

## 2010-11-10 DIAGNOSIS — I10 Essential (primary) hypertension: Secondary | ICD-10-CM

## 2010-11-10 DIAGNOSIS — E6609 Other obesity due to excess calories: Secondary | ICD-10-CM

## 2010-11-10 NOTE — Patient Instructions (Signed)
Follow up in 6 months with Dr Hochrein.  You will receive a letter in the mail 2 months before you are due.  Please call us when you receive this letter to schedule your follow up appointment.   The current medical regimen is effective;  continue present plan and medications.  

## 2010-11-10 NOTE — Assessment & Plan Note (Signed)
We are pursuing aggressive risk reduction.

## 2010-11-10 NOTE — Progress Notes (Signed)
HPI The patient presents for followup after an abnormal CT demonstrating some nonobstructive plaque mixed soft and calcified.  He had a borderline ETT leading to this. I staterd him on HCTZ when I last saw him.  He did well with this.  He has only taken a few readings.  His BP has been less than 140/90.  The patient denies any new symptoms such as chest discomfort, neck or arm discomfort. There has been no new shortness of breath, PND or orthopnea. There have been no reported palpitations, presyncope or syncope.  He has had some knee pain and limited activity with this.  No Known Allergies  Current Outpatient Prescriptions  Medication Sig Dispense Refill  . aspirin 81 MG tablet Take 81 mg by mouth daily.        . cetirizine (ZYRTEC) 10 MG tablet Take 10 mg by mouth as needed.       . diclofenac (VOLTAREN) 75 MG EC tablet Take 1 tablet (75 mg total) by mouth 2 (two) times daily with a meal. For inflamation  60 tablet  11  . hydrochlorothiazide (,MICROZIDE/HYDRODIURIL,) 12.5 MG capsule Take 1 capsule (12.5 mg total) by mouth daily.  30 capsule  11    Past Medical History  Diagnosis Date  . DTaP/IPV/HBV vaccination 2009  . Hx of colonoscopy 2005/ repeat 2010  . Allergy   . Arthritis     Past Surgical History  Procedure Date  . Remvoal sebaceous cyst 15-20 years ago  . Tonsillectomy     ROS:  As stated in the HPI and negative for all other systems.  PHYSICAL EXAM BP 156/100  Pulse 60 GENERAL:  Well appearing NECK:  No jugular venous distention, waveform within normal limits, carotid upstroke brisk and symmetric, no bruits, no thyromegaly LYMPHATICS:  No cervical, inguinal adenopathy LUNGS:  Clear to auscultation bilaterally BACK:  No CVA tenderness CHEST:  Unremarkable HEART:  PMI not displaced or sustained,S1 and S2 within normal limits, no S3, no S4, no clicks, no rubs, no murmurs ABD:  Flat, positive bowel sounds normal in frequency in pitch, no bruits, no rebound, no guarding,  no midline pulsatile mass, no hepatomegaly, no splenomegaly, obese EXT:  2 plus pulses throughout, no edema, no cyanosis no clubbing SKIN:  No rashes no nodules NEURO:  Cranial nerves II through XII grossly intact, motor grossly intact throughout PSYCH:  Cognitively intact, oriented to person place and time  ASSESSMENT AND PLAN

## 2010-11-10 NOTE — Assessment & Plan Note (Signed)
We reviewed these results from two days ago.  His LDL is still 96 but HDL is up to 42.  He does not want meds.  He will continue with diet and we discussed the Mediterranean diet.

## 2010-11-10 NOTE — Assessment & Plan Note (Signed)
We again discussed weight loss with diet and exercise.

## 2010-11-10 NOTE — Assessment & Plan Note (Signed)
For now I will leave him on the current meds.  The blood pressure continues to be high. I have instructed the patient to record a blood pressure diary and recording this. This will be presented for my review and pending these results I will make further suggestions about changes in therapy for optimal blood pressure control.  In particular he is to try to lose 5 lbs.

## 2011-01-23 ENCOUNTER — Encounter: Payer: Self-pay | Admitting: Family Medicine

## 2011-01-23 ENCOUNTER — Ambulatory Visit (INDEPENDENT_AMBULATORY_CARE_PROVIDER_SITE_OTHER): Payer: 59 | Admitting: Family Medicine

## 2011-01-23 VITALS — BP 189/100 | HR 61 | Temp 97.7°F | Ht 65.5 in | Wt 224.0 lb

## 2011-01-23 DIAGNOSIS — J019 Acute sinusitis, unspecified: Secondary | ICD-10-CM | POA: Insufficient documentation

## 2011-01-23 DIAGNOSIS — J209 Acute bronchitis, unspecified: Secondary | ICD-10-CM

## 2011-01-23 DIAGNOSIS — Z23 Encounter for immunization: Secondary | ICD-10-CM

## 2011-01-23 MED ORDER — HYDROCODONE-HOMATROPINE 5-1.5 MG/5ML PO SYRP: ORAL_SOLUTION | ORAL | Status: DC

## 2011-01-23 MED ORDER — AMOXICILLIN-POT CLAVULANATE 875-125 MG PO TABS: 1.0000 | ORAL_TABLET | Freq: Two times a day (BID) | ORAL | Status: AC

## 2011-01-23 NOTE — Patient Instructions (Signed)
Trial of mucinex DM or robitussin DM otc as directed on the box. May use OTC nasal saline spray or irrigation solution bid. OTC nonsedating antihistamines prn discussed.  Decongestant use discussed--ok if tolerated in the past w/out side effect and if pt has no hx of HTN. Use nasonex sample (1-2 sprays each nostril once daily).

## 2011-01-23 NOTE — Progress Notes (Signed)
OFFICE NOTE  01/23/2011  CC:  Chief Complaint  Patient presents with  . Cough    feeling bad since 11/18, cough/congestion worse since Sunday     HPI:   Patient is a 60 y.o. Caucasian male, former pt of Dr. Scotty Court, who is here for respiratory complaints. Pt presents complaining of respiratory symptoms for 10  days.  Mostly nasal congestion/runny nose, and PND cough.  Worst symptoms seems to be the worsening head and face pressure and worsening cough.  Lately the symptoms seem to be worsening--coughing more with deep breaths.   No fevers, no wheezing, and no SOB.  No pain in face or teeth.  No significant headache but says frontal sinus areas feel swollen.  ST mild at most.  Symptoms made worse by cold air, night time.  Symptoms improved by rx cough syrup he had leftover at home. Smoker? no Recent sick contact? no Muscle or joint aches? No He has not had the flu vaccine yet this season.  ROS: no n/v/d or abdominal pain.  No rash.  No neck stiffness.   +Mild fatigue.  +Mild appetite loss.   Pertinent PMH:  No hx of asthma or COPD No hx of recurrent sinusitis Allergic rhinitis HTN--pt reports good control per home measurements but admits he hasn't checked it any in the last several days since he's been taking duratuss regularly. CAD Obesity Dyslipidemia  Past surgical, family, and social history reviewed and there are no changes since the patient's last office visit.   MEDS;  Duratuss last few days, hydrocodone cough syrup last hs, zicam Outpatient Prescriptions Prior to Visit  Medication Sig Dispense Refill  . aspirin 81 MG tablet Take 81 mg by mouth daily.        . cetirizine (ZYRTEC) 10 MG tablet Take 10 mg by mouth as needed.       . diclofenac (VOLTAREN) 75 MG EC tablet Take 1 tablet (75 mg total) by mouth 2 (two) times daily with a meal. For inflamation  60 tablet  11  . hydrochlorothiazide (,MICROZIDE/HYDRODIURIL,) 12.5 MG capsule Take 1 capsule (12.5 mg total) by  mouth daily.  30 capsule  11    PE: Blood pressure 189/100, pulse 61, temperature 97.7 F (36.5 C), temperature source Oral, height 5' 5.5" (1.664 m), weight 224 lb (101.606 kg), SpO2 97.00%. VS: noted--bp up Gen: alert, NAD, NONTOXIC APPEARING. HEENT: eyes without injection, drainage, or swelling.  Ears: EACs clear, TMs with normal light reflex and landmarks.  Nose: Clear rhinorrhea, with some dried, crusty exudate adherent to mildly injected mucosa.  No purulent d/c.  Mild discomfort with paranasal sinus palpation, particularly frontal areas.  No facial swelling.  Throat and mouth without focal lesion.  No pharyngial swelling or exudate.  Mild pharyngial erythema diffusely with some clear/yellow PND. Neck: supple, no LAD.   LUNGS: CTA bilat, nonlabored resps.  He does have occasional excessive cough after a forced exhalation but this is not consistent.  Good aeration.  Exp phase not prolonged. CV: RRR, no m/r/g. EXT: no c/c/e SKIN: no rash  LAB: none  IMPRESSION AND PLAN:  Sinusitis acute With significant bronchitis component as well. With him being at the 10 day mark and actually worsening, I will start antibiotic trial--augmentin 875mg , 1 tab bid x 10d. Trial of mucinex DM or robitussin DM otc as directed on the box. May use OTC nasal saline spray or irrigation solution bid. OTC nonsedating antihistamines prn discussed.  Decongestant use discussed--he needs to avoid phenylephrine and  sudafed b/c I think the phenylephrine in duratuss is the recent cause of his poor bp control.   Nasonex sample given, 2 sprays each nostril qd.    Flu vaccine given IM today.  FOLLOW UP:  Return if symptoms worsen or fail to improve.

## 2011-01-23 NOTE — Assessment & Plan Note (Addendum)
With significant bronchitis component as well. With him being at the 10 day mark and actually worsening, I will start antibiotic trial--augmentin 875mg , 1 tab bid x 10d. Trial of mucinex DM or robitussin DM otc as directed on the box. May use OTC nasal saline spray or irrigation solution bid. OTC nonsedating antihistamines prn discussed.  Decongestant use discussed--he needs to avoid phenylephrine and sudafed b/c I think the phenylephrine in duratuss is the recent cause of his poor bp control.   Nasonex sample given, 2 sprays each nostril qd.

## 2011-02-22 ENCOUNTER — Encounter: Payer: Self-pay | Admitting: Family Medicine

## 2011-02-22 ENCOUNTER — Ambulatory Visit (INDEPENDENT_AMBULATORY_CARE_PROVIDER_SITE_OTHER): Payer: 59 | Admitting: Family Medicine

## 2011-02-22 VITALS — BP 180/95 | HR 57 | Ht 65.5 in | Wt 222.0 lb

## 2011-02-22 DIAGNOSIS — R05 Cough: Secondary | ICD-10-CM

## 2011-02-22 MED ORDER — HYDROCODONE-HOMATROPINE 5-1.5 MG/5ML PO SYRP: ORAL_SOLUTION | ORAL | Status: AC

## 2011-02-22 MED ORDER — MONTELUKAST SODIUM 10 MG PO TABS: 10.0000 mg | ORAL_TABLET | Freq: Every day | ORAL | Status: DC

## 2011-02-24 DIAGNOSIS — R059 Cough, unspecified: Secondary | ICD-10-CM | POA: Insufficient documentation

## 2011-02-24 DIAGNOSIS — R05 Cough: Secondary | ICD-10-CM | POA: Insufficient documentation

## 2011-02-24 NOTE — Progress Notes (Signed)
OFFICE NOTE  02/24/2011  CC:  Chief Complaint  Patient presents with  . URI    chest congestion, nocturnal cough     HPI:   Patient is a 60 y.o. Caucasian male who is here for follow up of URI/bronchitis that I saw him for about 1 mo ago. He took a course of abx, hycodan cough syrup, and saline nasal spray and he felt much improved, almost well except mild lingering nonproductive cough without SOB or wheeze.  Then about 3-4 days ago he got more URI sx's, now feels like cough coming back. Denies fever, chest pain, chest tightness, or production of mucous or hemoptysis.  No face pain.  No ST.  No n/v/d or rash.  Pertinent PMH:  Obesity CAD HTN Dyslipidemia Allergic rhinitis  MEDS;   Outpatient Prescriptions Prior to Visit  Medication Sig Dispense Refill  . aspirin 81 MG tablet Take 81 mg by mouth daily.        . diclofenac (VOLTAREN) 75 MG EC tablet Take 1 tablet (75 mg total) by mouth 2 (two) times daily with a meal. For inflamation  60 tablet  11  . hydrochlorothiazide (,MICROZIDE/HYDRODIURIL,) 12.5 MG capsule Take 1 capsule (12.5 mg total) by mouth daily.  30 capsule  11  . cetirizine (ZYRTEC) 10 MG tablet Take 10 mg by mouth as needed.       . Homeopathic Products (ZICAM COLD REMEDY PO) Take by mouth as needed.        Marland Kitchen Phenylephrine-Guaifenesin (DURATUSS PO) Take by mouth as needed.          PE: Blood pressure 180/95, pulse 57, height 5' 5.5" (1.664 m), weight 222 lb (100.699 kg). VS: noted--normal. Gen: alert, NAD, NONTOXIC APPEARING. HEENT: eyes without injection, drainage, or swelling.  Ears: EACs clear, TMs with normal light reflex and landmarks.  Nose: Clear rhinorrhea, with some dried, crusty exudate adherent to mildly injected mucosa.  No purulent d/c.  No paranasal sinus TTP.  No facial swelling.  Throat and mouth without focal lesion.  No pharyngial swelling, erythema, or exudate.   Neck: supple, no LAD.   LUNGS: CTA bilat, nonlabored resps.  Expiration phase not  prolonged.  Minimal post-exhalational coughing. CV: RRR, no m/r/g. EXT: no c/c/e SKIN: no rash  Lab: none today  IMPRESSION AND PLAN:  Cough Post-viral cough/pneumonitis. Recommended singulair 10mg  qd x 42mo.  Zyrtec 10mg  qd (he has some at his home). Hycodan 1-2 tsp q6h prn, #120 ml, no RF. Return if not much improved in 42mo.    FOLLOW UP:  Return if symptoms worsen or fail to improve in 1 month.

## 2011-02-24 NOTE — Assessment & Plan Note (Signed)
Post-viral cough/pneumonitis. Recommended singulair 10mg  qd x 65mo.  Zyrtec 10mg  qd (he has some at his home). Hycodan 1-2 tsp q6h prn, #120 ml, no RF. Return if not much improved in 65mo.

## 2011-07-15 ENCOUNTER — Other Ambulatory Visit: Payer: Self-pay | Admitting: Family Medicine

## 2011-07-18 NOTE — Telephone Encounter (Signed)
I'll approve RF x 1 of his diclofenac but he needs O/V for fasting CPE prior to any further RFs--he is due for this.--thx

## 2011-07-27 NOTE — Telephone Encounter (Signed)
Left a message for patient to return call.

## 2011-08-01 NOTE — Telephone Encounter (Signed)
Patient informed and states he will call back for an appt

## 2011-08-13 ENCOUNTER — Other Ambulatory Visit (HOSPITAL_COMMUNITY): Payer: Self-pay

## 2011-08-13 DIAGNOSIS — I10 Essential (primary) hypertension: Secondary | ICD-10-CM

## 2011-08-13 MED ORDER — HYDROCHLOROTHIAZIDE 12.5 MG PO CAPS: 12.5000 mg | ORAL_CAPSULE | Freq: Every day | ORAL | Status: DC

## 2011-08-13 NOTE — Telephone Encounter (Signed)
..   Requested Prescriptions   Signed Prescriptions Disp Refills  . hydrochlorothiazide (MICROZIDE) 12.5 MG capsule 30 capsule 2    Sig: Take 1 capsule (12.5 mg total) by mouth daily.    Authorizing Provider: Rollene Rotunda    Ordering User: Christella Hartigan, Orva Riles Judie Petit

## 2011-09-11 ENCOUNTER — Telehealth: Payer: Self-pay | Admitting: Cardiology

## 2011-09-11 NOTE — Telephone Encounter (Signed)
Pt is going to see Dr. Samul Dada office on Monday and if he needs to have lab work done can you please send them the request so he doesn't have to get stuck twice

## 2011-09-17 ENCOUNTER — Encounter: Payer: Self-pay | Admitting: Family Medicine

## 2011-09-17 ENCOUNTER — Ambulatory Visit (INDEPENDENT_AMBULATORY_CARE_PROVIDER_SITE_OTHER): Payer: 59 | Admitting: Family Medicine

## 2011-09-17 VITALS — BP 163/94 | HR 56 | Ht 65.5 in | Wt 223.0 lb

## 2011-09-17 DIAGNOSIS — I10 Essential (primary) hypertension: Secondary | ICD-10-CM

## 2011-09-17 DIAGNOSIS — E785 Hyperlipidemia, unspecified: Secondary | ICD-10-CM

## 2011-09-17 DIAGNOSIS — Z1211 Encounter for screening for malignant neoplasm of colon: Secondary | ICD-10-CM

## 2011-09-17 DIAGNOSIS — Z0000 Encounter for general adult medical examination without abnormal findings: Secondary | ICD-10-CM | POA: Insufficient documentation

## 2011-09-17 DIAGNOSIS — Z125 Encounter for screening for malignant neoplasm of prostate: Secondary | ICD-10-CM

## 2011-09-17 LAB — COMPREHENSIVE METABOLIC PANEL
Albumin: 4.2 g/dL (ref 3.5–5.2)
BUN: 20 mg/dL (ref 6–23)
Calcium: 9.5 mg/dL (ref 8.4–10.5)
Chloride: 101 mEq/L (ref 96–112)
Glucose, Bld: 136 mg/dL — ABNORMAL HIGH (ref 70–99)
Potassium: 3.9 mEq/L (ref 3.5–5.1)

## 2011-09-17 LAB — LIPID PANEL
Cholesterol: 172 mg/dL (ref 0–200)
Triglycerides: 129 mg/dL (ref 0.0–149.0)

## 2011-09-17 LAB — CBC WITH DIFFERENTIAL/PLATELET
Basophils Relative: 0.7 % (ref 0.0–3.0)
Eosinophils Absolute: 0.1 10*3/uL (ref 0.0–0.7)
Hemoglobin: 14.1 g/dL (ref 13.0–17.0)
Lymphocytes Relative: 37.5 % (ref 12.0–46.0)
MCHC: 33.9 g/dL (ref 30.0–36.0)
Monocytes Relative: 11.4 % (ref 3.0–12.0)
Neutro Abs: 3.1 10*3/uL (ref 1.4–7.7)
RBC: 4.39 Mil/uL (ref 4.22–5.81)

## 2011-09-17 NOTE — Patient Instructions (Addendum)
Health Maintenance, Males A healthy lifestyle and preventative care can promote health and wellness.  Maintain regular health, dental, and eye exams.   Eat a healthy diet. Foods like vegetables, fruits, whole grains, low-fat dairy products, and lean protein foods contain the nutrients you need without too many calories. Decrease your intake of foods high in solid fats, added sugars, and salt. Get information about a proper diet from your caregiver, if necessary.   Regular physical exercise is one of the most important things you can do for your health. Most adults should get at least 150 minutes of moderate-intensity exercise (any activity that increases your heart rate and causes you to sweat) each week. In addition, most adults need muscle-strengthening exercises on 2 or more days a week.    Maintain a healthy weight. The body mass index (BMI) is a screening tool to identify possible weight problems. It provides an estimate of body fat based on height and weight. Your caregiver can help determine your BMI, and can help you achieve or maintain a healthy weight. For adults 20 years and older:   A BMI below 18.5 is considered underweight.   A BMI of 18.5 to 24.9 is normal.   A BMI of 25 to 29.9 is considered overweight.   A BMI of 30 and above is considered obese.   Maintain normal blood lipids and cholesterol by exercising and minimizing your intake of saturated fat. Eat a balanced diet with plenty of fruits and vegetables. Blood tests for lipids and cholesterol should begin at age 20 and be repeated every 5 years. If your lipid or cholesterol levels are high, you are over 50, or you are a high risk for heart disease, you may need your cholesterol levels checked more frequently.Ongoing high lipid and cholesterol levels should be treated with medicines, if diet and exercise are not effective.   If you smoke, find out from your caregiver how to quit. If you do not use tobacco, do not start.    If you choose to drink alcohol, do not exceed 2 drinks per day. One drink is considered to be 12 ounces (355 mL) of beer, 5 ounces (148 mL) of wine, or 1.5 ounces (44 mL) of liquor.   Avoid use of street drugs. Do not share needles with anyone. Ask for help if you need support or instructions about stopping the use of drugs.   High blood pressure causes heart disease and increases the risk of stroke. Blood pressure should be checked at least every 1 to 2 years. Ongoing high blood pressure should be treated with medicines if weight loss and exercise are not effective.   If you are 45 to 63 years old, ask your caregiver if you should take aspirin to prevent heart disease.   Diabetes screening involves taking a blood sample to check your fasting blood sugar level. This should be done once every 3 years, after age 45, if you are within normal weight and without risk factors for diabetes. Testing should be considered at a younger age or be carried out more frequently if you are overweight and have at least 1 risk factor for diabetes.   Colorectal cancer can be detected and often prevented. Most routine colorectal cancer screening begins at the age of 50 and continues through age 75. However, your caregiver may recommend screening at an earlier age if you have risk factors for colon cancer. On a yearly basis, your caregiver may provide home test kits to check for hidden   blood in the stool. Use of a small camera at the end of a tube, to directly examine the colon (sigmoidoscopy or colonoscopy), can detect the earliest forms of colorectal cancer. Talk to your caregiver about this at age 50, when routine screening begins. Direct examination of the colon should be repeated every 5 to 10 years through age 75, unless early forms of pre-cancerous polyps or small growths are found.   Hepatitis C blood testing is recommended for all people born from 1945 through 1965 and any individual with known risks for  hepatitis C.   Healthy men should no longer receive prostate-specific antigen (PSA) blood tests as part of routine cancer screening. Consult with your caregiver about prostate cancer screening.   Testicular cancer screening is not recommended for adolescents or adult males who have no symptoms. Screening includes self-exam, caregiver exam, and other screening tests. Consult with your caregiver about any symptoms you have or any concerns you have about testicular cancer.   Practice safe sex. Use condoms and avoid high-risk sexual practices to reduce the spread of sexually transmitted infections (STIs).   Use sunscreen with a sun protection factor (SPF) of 30 or greater. Apply sunscreen liberally and repeatedly throughout the day. You should seek shade when your shadow is shorter than you. Protect yourself by wearing long sleeves, pants, a wide-brimmed hat, and sunglasses year round, whenever you are outdoors.   Notify your caregiver of new moles or changes in moles, especially if there is a change in shape or color. Also notify your caregiver if a mole is larger than the size of a pencil eraser.   A one-time screening for abdominal aortic aneurysm (AAA) and surgical repair of large AAAs by sound wave imaging (ultrasonography) is recommended for ages 65 to 75 years who are current or former smokers.   Stay current with your immunizations.  Document Released: 08/11/2007 Document Revised: 02/01/2011 Document Reviewed: 07/10/2010 [employer] Patient Information 2012 [employer]. 

## 2011-09-17 NOTE — Assessment & Plan Note (Addendum)
Reviewed age and gender appropriate health maintenance issues (prudent diet, regular exercise, health risks of tobacco and excessive alcohol, use of seatbelts, fire alarms in home, use of sunscreen).  Also reviewed age and gender appropriate health screening as well as vaccine recommendations. Vaccines UTD. Colon cancer screening/adenomatous polyp surveillance: next colonoscopy due 2017.  He would like an iFOB today so I ordered this. Prostate exam normal today, PSA ordered for screening purposes. Check CBC, CMET, TSH, and FLP. Discussed/encouraged lifestyle mod/wt reduction.

## 2011-09-17 NOTE — Assessment & Plan Note (Signed)
Gets normal bp measurements on his upper arm cuff at home.  Continue HCTZ 25mg  qd. Check CMET today.

## 2011-09-17 NOTE — Progress Notes (Signed)
Office Note 09/17/2011  CC:  Chief Complaint  Patient presents with  . Annual Exam    no problems, fasting    HPI:  Michael Hodge is a 61 y.o. White male who is here for annual health maintenance exam. Feeling well.  Takes one diclofenac a day and right knee feels fine.  If he doesn't take it the right knee bothers him.  He has cardiology f/u scheduled for early this fall.  Past Medical History  Diagnosis Date  . DTaP/IPV/HBV vaccination 2009  . Hx of colonoscopy 2005/ repeat 2010  . Allergy   . Arthritis   . Hypertension   . CAD (coronary artery disease)     CT demonstrated nonobstructive plaques (this test was prompted by borderline ETT.  Marland Kitchen Dyslipidemia   . Obesity     Past Surgical History  Procedure Date  . Remvoal sebaceous cyst 15-20 years ago  . Tonsillectomy   . Colonoscopy w/ polypectomy 08/21/10    adenomatous; also mild diverticulosis; repeat 2017.    Family History  Problem Relation Age of Onset  . Stroke Other   . Diabetes Father   . Stroke Father   . Heart attack Mother     History   Social History  . Marital Status: Married    Spouse Name: N/A    Number of Children: N/A  . Years of Education: N/A   Occupational History  . Not on file.   Social History Main Topics  . Smoking status: Never Smoker   . Smokeless tobacco: Never Used  . Alcohol Use: 1.8 oz/week    3 Shots of liquor per week     occ  . Drug Use: No  . Sexually Active: Yes   Other Topics Concern  . 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    Outpatient Prescriptions Prior to Visit  Medication Sig Dispense Refill  . aspirin 81 MG tablet Take 81 mg by mouth daily.        . diclofenac (VOLTAREN) 75 MG EC tablet TAKE 1 TABLET (75 MG TOTAL) BY MOUTH 2 (TWO) TIMES DAILY WITH A MEAL.FOR INFLAMATION  60 tablet  0  . hydrochlorothiazide (MICROZIDE) 12.5 MG capsule Take 1 capsule (12.5  mg total) by mouth daily.  30 capsule  2  . montelukast (SINGULAIR) 10 MG tablet Take 1 tablet (10 mg total) by mouth at bedtime.  30 tablet  1  . dextromethorphan-guaiFENesin (MUCINEX DM) 30-600 MG per 12 hr tablet Take 1 tablet by mouth every 12 (twelve) hours.          No Known Allergies  ROS Review of Systems  Constitutional: Negative for fever, chills, appetite change and fatigue.  HENT: Negative for ear pain, congestion, sore throat, neck stiffness and dental problem.   Eyes: Negative for discharge, redness and visual disturbance.  Respiratory: Negative for cough, chest tightness, shortness of breath and wheezing.   Cardiovascular: Negative for chest pain, palpitations and leg swelling.  Gastrointestinal: Negative for nausea, vomiting, abdominal pain, diarrhea and blood in stool.  Genitourinary: Negative for dysuria, urgency, frequency, hematuria, flank pain and difficulty urinating.  Musculoskeletal: Negative for myalgias, back pain, joint swelling and arthralgias.  Skin: Negative for pallor and rash.  Neurological: Negative for dizziness, speech difficulty, weakness and headaches.  Hematological: Negative for adenopathy. Does not bruise/bleed easily.  Psychiatric/Behavioral: Negative for confusion and disturbed wake/sleep cycle. The  patient is not nervous/anxious.     PE; Blood pressure 163/94, pulse 56, height 5' 5.5" (1.664 m), weight 223 lb (101.152 kg). Gen: Alert, well appearing, obese white male.  Patient is oriented to person, place, time, and situation. Affect: pleasant, lucid thought and speech. ENT: Ears: EACs clear, normal epithelium.  TMs with good light reflex and landmarks bilaterally.  Eyes: no injection, icteris, swelling, or exudate.  EOMI, PERRLA. Nose: no drainage or turbinate edema/swelling.  No injection or focal lesion.  Mouth: lips without lesion/swelling.  Oral mucosa pink and moist.  Dentition intact and without obvious caries or gingival swelling.   Oropharynx without erythema, exudate, or swelling.  Neck: supple/nontender.  No LAD, mass, or TM.  Carotid pulses 2+ bilaterally, without bruits. CV: RRR, no m/r/g.   LUNGS: CTA bilat, nonlabored resps, good aeration in all lung fields. ABD: soft, NT, ND, BS normal.  No hepatospenomegaly or mass.  No bruits. EXT: no clubbing, cyanosis, or edema.  Skin - no sores or suspicious lesions or rashes or color changes  Pertinent labs:  None today  ASSESSMENT AND PLAN:   Health maintenance examination Reviewed age and gender appropriate health maintenance issues (prudent diet, regular exercise, health risks of tobacco and excessive alcohol, use of seatbelts, fire alarms in home, use of sunscreen).  Also reviewed age and gender appropriate health screening as well as vaccine recommendations. Vaccines UTD. Colon cancer screening/adenomatous polyp surveillance: next colonoscopy due 2017.  He would like an iFOB today so I ordered this. Prostate exam normal today, PSA ordered for screening purposes. Check CBC, CMET, TSH, and FLP. Discussed/encouraged lifestyle mod/wt reduction.  HTN (hypertension), benign Gets normal bp measurements on his upper arm cuff at home.  Continue HCTZ 25mg  qd. Check CMET today.    FOLLOW UP:  Return in about 6 months (around 03/19/2012) for f/u HTN.

## 2011-09-25 ENCOUNTER — Other Ambulatory Visit: Payer: 59

## 2011-09-25 DIAGNOSIS — I10 Essential (primary) hypertension: Secondary | ICD-10-CM

## 2011-09-25 DIAGNOSIS — Z125 Encounter for screening for malignant neoplasm of prostate: Secondary | ICD-10-CM

## 2011-09-25 DIAGNOSIS — E785 Hyperlipidemia, unspecified: Secondary | ICD-10-CM

## 2011-09-25 DIAGNOSIS — Z0000 Encounter for general adult medical examination without abnormal findings: Secondary | ICD-10-CM

## 2011-09-25 DIAGNOSIS — Z1211 Encounter for screening for malignant neoplasm of colon: Secondary | ICD-10-CM

## 2011-09-25 LAB — FECAL OCCULT BLOOD, IMMUNOCHEMICAL: Fecal Occult Bld: NEGATIVE

## 2011-09-28 ENCOUNTER — Ambulatory Visit (INDEPENDENT_AMBULATORY_CARE_PROVIDER_SITE_OTHER): Payer: 59 | Admitting: *Deleted

## 2011-09-28 VITALS — BP 130/92

## 2011-09-28 DIAGNOSIS — E119 Type 2 diabetes mellitus without complications: Secondary | ICD-10-CM

## 2011-09-28 NOTE — Progress Notes (Signed)
Pt presented for glucometer teaching.  Pt given Freestyle Lite meter and 10 extra strips.  Pt was instructed on how to collect blood sample using aseptic technique.  Pt will check blood sugar fasting and one 2 hour PP each day.  Pt can pick which meal.  Advised to only drink water during that 2 hours.  Pt voices understanding and is able to use lancet device and meter.  Pt CBG in office today is 110.  Pt has not had anything to eat today, but did have some Diet Coke.  He will call back to schedule appt in 10 days.

## 2011-10-11 ENCOUNTER — Encounter: Payer: Self-pay | Admitting: Family Medicine

## 2011-10-11 ENCOUNTER — Ambulatory Visit (INDEPENDENT_AMBULATORY_CARE_PROVIDER_SITE_OTHER): Payer: 59 | Admitting: Family Medicine

## 2011-10-11 VITALS — BP 156/88 | HR 55 | Temp 98.1°F | Ht 65.5 in | Wt 216.1 lb

## 2011-10-11 DIAGNOSIS — I10 Essential (primary) hypertension: Secondary | ICD-10-CM

## 2011-10-11 DIAGNOSIS — E119 Type 2 diabetes mellitus without complications: Secondary | ICD-10-CM | POA: Insufficient documentation

## 2011-10-11 NOTE — Progress Notes (Signed)
OFFICE NOTE  10/11/2011  CC:  Chief Complaint  Patient presents with  . Follow-up    sugar testing     HPI: Patient is a 61 y.o. Caucasian male who is here for f/u recent dx of DM 2 about 3 wks ago. He has been monitoring his glucoses: fastings 90-120, 2H PP 120-140, only one reading over 150. He has been working on eating less carbohydrates and smaller portions of food overall.  He is not on any kind of exercise regimen but is trying to be more conscious of the need to increase activity when he can. He has some good general questions about diet, monitoring of glucoses, and gen activity level.  Also, we discussed the routine diabetic monitoring like HbA1c, annual eye exams, annual urine microalb/cr, annual foot exam.    He monitors his bps regularly at home and all numbers are <130/80.  Pertinent PMH:  Past Medical History  Diagnosis Date  . DTaP/IPV/HBV vaccination 2009  . Hx of colonoscopy 2005/ repeat 2010    iFOB negative 09/2011  . Allergy   . Arthritis   . Hypertension   . CAD (coronary artery disease)     CT demonstrated nonobstructive plaques (this test was prompted by borderline ETT.  Marland Kitchen Dyslipidemia   . Obesity   . DM type 2 (diabetes mellitus, type 2) dx'd 08/2011    Fasting gluc 136 and HbA1c 6.7% at diagnosis.     MEDS:  Outpatient Prescriptions Prior to Visit  Medication Sig Dispense Refill  . aspirin 81 MG tablet Take 81 mg by mouth daily.        . hydrochlorothiazide (MICROZIDE) 12.5 MG capsule Take 1 capsule (12.5 mg total) by mouth daily.  30 capsule  2  . diclofenac (VOLTAREN) 75 MG EC tablet TAKE 1 TABLET (75 MG TOTAL) BY MOUTH 2 (TWO) TIMES DAILY WITH A MEAL.FOR INFLAMATION  60 tablet  0  . montelukast (SINGULAIR) 10 MG tablet Take 1 tablet (10 mg total) by mouth at bedtime.  30 tablet  1    PE: Blood pressure 156/88, pulse 55, temperature 98.1 F (36.7 C), temperature source Temporal, height 5' 5.5" (1.664 m), weight 216 lb 1.9 oz (98.031 kg), SpO2  98.00%. Gen: Alert, well appearing.  Patient is oriented to person, place, time, and situation. No further exam today.  IMPRESSION AND PLAN:  Type II or unspecified type diabetes mellitus without mention of complication, not stated as uncontrolled Very early stage.  We did extensive education for this condition today. Monitoring goal is check glucose qod (alternate a fasting with a 2 h postprandial) for the next couple of weeks and if still getting normal readings then change to twice weekly checks.  Reviewed fasting goal of <110 and 2H PP goal of <170.   Reviewed routine diabetic monitoring for end-organ damage, gave pt handout on HbA1c testing and how different levels correlated with different risks of end organ damage.  I went over this document with him. Also discussed the basics of dieting for diabetes and importance of lifestyle mod to lose wt and keep wt off.  Gave handout called "Living well with diabetes".   Spent  HTN (hypertension), benign Problem stable.  Continue current medications and diet appropriate for this condition.  We have reviewed our general long term plan for this problem and also reviewed symptoms and signs that should prompt the patient to call or return to the office.   Spent 25 min with pt today, with >50% of this  time spent in counseling and care coordination regarding the above problems.  FOLLOW UP:  4 months, at which time we'll do his first foot exam, first urine microalb/cr, and we'll recheck HbA1c.

## 2011-10-11 NOTE — Assessment & Plan Note (Signed)
Very early stage.  We did extensive education for this condition today. Monitoring goal is check glucose qod (alternate a fasting with a 2 h postprandial) for the next couple of weeks and if still getting normal readings then change to twice weekly checks.  Reviewed fasting goal of <110 and 2H PP goal of <170.   Reviewed routine diabetic monitoring for end-organ damage, gave pt handout on HbA1c testing and how different levels correlated with different risks of end organ damage.  I went over this document with him. Also discussed the basics of dieting for diabetes and importance of lifestyle mod to lose wt and keep wt off.  Gave handout called "Living well with diabetes".   Spent

## 2011-10-11 NOTE — Assessment & Plan Note (Signed)
Problem stable.  Continue current medications and diet appropriate for this condition.  We have reviewed our general long term plan for this problem and also reviewed symptoms and signs that should prompt the patient to call or return to the office.  

## 2011-10-19 ENCOUNTER — Other Ambulatory Visit: Payer: Self-pay | Admitting: Family Medicine

## 2011-10-19 NOTE — Telephone Encounter (Signed)
eScribe request for refill on Diclofenac Last filled - 07/18/11, #60 x 0 Last seen on - 10/11/11 Follow up - 4 mos-not scheduled Please advise refills.

## 2011-10-25 ENCOUNTER — Encounter: Payer: Self-pay | Admitting: Cardiology

## 2011-10-25 ENCOUNTER — Ambulatory Visit (INDEPENDENT_AMBULATORY_CARE_PROVIDER_SITE_OTHER): Payer: 59 | Admitting: Cardiology

## 2011-10-25 VITALS — BP 150/90 | HR 48 | Ht 66.0 in | Wt 213.8 lb

## 2011-10-25 DIAGNOSIS — I10 Essential (primary) hypertension: Secondary | ICD-10-CM

## 2011-10-25 DIAGNOSIS — I2510 Atherosclerotic heart disease of native coronary artery without angina pectoris: Secondary | ICD-10-CM

## 2011-10-25 MED ORDER — HYDROCHLOROTHIAZIDE 12.5 MG PO CAPS: 12.5000 mg | ORAL_CAPSULE | Freq: Every day | ORAL | Status: DC

## 2011-10-25 NOTE — Patient Instructions (Signed)
The current medical regimen is effective;  continue present plan and medications.  Your physician has requested that you have an exercise tolerance test in 1 year. For further information please visit [email or web address]. Please also follow instruction sheet, as given.   

## 2011-10-25 NOTE — Progress Notes (Signed)
HPI The patient presents for followup after an abnormal CT demonstrating some nonobstructive plaque mixed soft and calcified.  He had a borderline ETT leading to this. Since I last saw him he has done well. He has participated in risk reduction. He's not exercising as much as I would like. However, his diet is improving he's lost 10 pounds. With his current activities he denies any chest pressure, neck or arm discomfort. He denies any shortness of breath, PND or orthopnea. He's had no palpitations, presyncope or syncope. He's had no weight gain or edema.  No Known Allergies  Current Outpatient Prescriptions  Medication Sig Dispense Refill  . aspirin 81 MG tablet Take 81 mg by mouth daily.        . diclofenac (VOLTAREN) 75 MG EC tablet TAKE 1 TABLET BY MOUTH TWICE DAILY WITH A MEAL FOR INFLAMMATION  60 tablet  0  . hydrochlorothiazide (MICROZIDE) 12.5 MG capsule Take 1 capsule (12.5 mg total) by mouth daily.  30 capsule  2    Past Medical History  Diagnosis Date  . DTaP/IPV/HBV vaccination 2009  . Hx of colonoscopy 2005/ repeat 2010    iFOB negative 09/2011  . Allergy   . Arthritis   . Hypertension   . CAD (coronary artery disease)     CT demonstrated nonobstructive plaques (this test was prompted by borderline ETT.  Marland Kitchen Dyslipidemia   . Obesity   . DM type 2 (diabetes mellitus, type 2) dx'd 08/2011    Fasting gluc 136 and HbA1c 6.7% at diagnosis.     Past Surgical History  Procedure Date  . Remvoal sebaceous cyst 15-20 years ago  . Tonsillectomy   . Colonoscopy w/ polypectomy 08/21/10    adenomatous; also mild diverticulosis; repeat 2017.    ROS:  As stated in the HPI and negative for all other systems.  PHYSICAL EXAM BP 150/90  Pulse 48  Ht 5\' 6"  (1.676 m)  Wt 213 lb 12.8 oz (96.979 kg)  BMI 34.51 kg/m2 GENERAL:  Well appearing NECK:  No jugular venous distention, waveform within normal limits, carotid upstroke brisk and symmetric, no bruits, no thyromegaly LYMPHATICS:   No cervical, inguinal adenopathy LUNGS:  Clear to auscultation bilaterally BACK:  No CVA tenderness CHEST:  Unremarkable HEART:  PMI not displaced or sustained,S1 and S2 within normal limits, no S3, no S4, no clicks, no rubs, no murmurs ABD:  Flat, positive bowel sounds normal in frequency in pitch, no bruits, no rebound, no guarding, no midline pulsatile mass, no hepatomegaly, no splenomegaly, mildly obese EXT:  2 plus pulses throughout, no edema, no cyanosis no clubbing SKIN:  No rashes no nodules NEURO:  Cranial nerves II through XII grossly intact, motor grossly intact throughout PSYCH:  Cognitively intact, oriented to person place and time  ASSESSMENT AND PLAN  HTN (hypertension) -  I reviewed his blood pressure diary. His blood pressure is well controlled in the morning. He will check some afternoon and evening readings. For now he'll remain on the meds as listed.  Dyslipidemia -  His LDL is slightly above target particularly given his new diagnosis of glucose intolerance. I will defer to his primary provider. This can be reassessed in the future as he has maintained a better diet.  Exogenous obesity -  I am delighted with his weight loss and he will continue with the same.  CAD (coronary artery disease) -  He had nonobstructive plaque last year on CT. He will continue with risk reduction. I will  schedule him for an exercise treadmill test when I see you next year.

## 2011-12-19 ENCOUNTER — Other Ambulatory Visit: Payer: Self-pay | Admitting: Family Medicine

## 2011-12-20 ENCOUNTER — Other Ambulatory Visit: Payer: Self-pay | Admitting: Family Medicine

## 2011-12-20 NOTE — Telephone Encounter (Signed)
eScribe request for refill on diclofenac Last filled - 10/19/11, #60 x 0 Last seen on - 10/11/11 Follow up - 4 months Please advise refills.

## 2012-02-18 ENCOUNTER — Telehealth: Payer: Self-pay

## 2012-02-18 NOTE — Telephone Encounter (Signed)
Pt scheduled a 4 month follow up appt for 02-25-12. Pt would like to know if MD wants him to do labs prior to appt. If so, he states he will go to the [place] office for labs. Please advise?

## 2012-02-19 ENCOUNTER — Other Ambulatory Visit: Payer: Self-pay | Admitting: *Deleted

## 2012-02-19 ENCOUNTER — Other Ambulatory Visit: Payer: Self-pay | Admitting: Family Medicine

## 2012-02-19 DIAGNOSIS — E119 Type 2 diabetes mellitus without complications: Secondary | ICD-10-CM

## 2012-02-19 MED ORDER — DICLOFENAC SODIUM 75 MG PO TBEC: 75.0000 mg | DELAYED_RELEASE_TABLET | Freq: Two times a day (BID) | ORAL | Status: DC

## 2012-02-19 NOTE — Telephone Encounter (Signed)
Yes, have him get labs early at [facility].  I'll put orders in now.--thx

## 2012-02-19 NOTE — Telephone Encounter (Signed)
PATIENT NOTIFIED OF LAB ORDERS CAN BE DONE AT [facility] BEFORE HIS APPT. WITH DR. Marvel Plan. AWARE ALSO OF REFILL ON MEDICATION TO [facility].

## 2012-02-25 ENCOUNTER — Other Ambulatory Visit (INDEPENDENT_AMBULATORY_CARE_PROVIDER_SITE_OTHER): Payer: 59

## 2012-02-25 DIAGNOSIS — E119 Type 2 diabetes mellitus without complications: Secondary | ICD-10-CM

## 2012-02-25 LAB — HEMOGLOBIN A1C: Hgb A1c MFr Bld: 6.6 % — ABNORMAL HIGH (ref 4.6–6.5)

## 2012-02-26 ENCOUNTER — Ambulatory Visit (INDEPENDENT_AMBULATORY_CARE_PROVIDER_SITE_OTHER): Payer: 59 | Admitting: Family Medicine

## 2012-02-26 ENCOUNTER — Encounter: Payer: Self-pay | Admitting: Family Medicine

## 2012-02-26 VITALS — BP 155/96 | HR 52 | Ht 65.5 in | Wt 215.0 lb

## 2012-02-26 DIAGNOSIS — E785 Hyperlipidemia, unspecified: Secondary | ICD-10-CM

## 2012-02-26 DIAGNOSIS — E119 Type 2 diabetes mellitus without complications: Secondary | ICD-10-CM

## 2012-02-26 DIAGNOSIS — E669 Obesity, unspecified: Secondary | ICD-10-CM

## 2012-02-26 DIAGNOSIS — E6609 Other obesity due to excess calories: Secondary | ICD-10-CM

## 2012-02-26 DIAGNOSIS — I10 Essential (primary) hypertension: Secondary | ICD-10-CM

## 2012-02-26 MED ORDER — DICLOFENAC SODIUM 75 MG PO TBEC: 75.0000 mg | DELAYED_RELEASE_TABLET | Freq: Two times a day (BID) | ORAL | Status: DC

## 2012-02-26 NOTE — Patient Instructions (Signed)
Goal BP is <130 and <80 on bottom.  Technically, high bp is > 140 on top and >90 on bottom.

## 2012-02-26 NOTE — Progress Notes (Signed)
OFFICE NOTE  02/26/2012  CC:  Chief Complaint  Patient presents with  . Follow-up    BP-120's over 70-80's at home; DM-numbers are good  . Medication Refill    refill     HPI: Patient is a 61 y.o. Caucasian male who is here for 4 mo DM 2 f/u. Recent dx, TLC only at this time. BP's at home normal. Says he feels well.   Glucose log: fastings and 2H PP avgs about 110-120. A1c yest 6.6%, urine microalb neg.  Pertinent PMH:  Past Medical History  Diagnosis Date  . DTaP/IPV/HBV vaccination 2009  . Hx of colonoscopy 2005/ repeat 2010    iFOB negative 09/2011  . Allergy   . Arthritis   . Hypertension   . CAD (coronary artery disease)     CT demonstrated nonobstructive plaques (this test was prompted by borderline ETT.  Marland Kitchen Dyslipidemia   . Obesity   . DM type 2 (diabetes mellitus, type 2) dx'd 08/2011    Fasting gluc 136 and HbA1c 6.7% at diagnosis.    Past surgical, social, and family history reviewed and no changes noted since last office visit.  MEDS:  Outpatient Prescriptions Prior to Visit  Medication Sig Dispense Refill  . aspirin 81 MG tablet Take 81 mg by mouth daily.        . diclofenac (VOLTAREN) 75 MG EC tablet Take 1 tablet (75 mg total) by mouth 2 (two) times daily.  60 tablet  0  . hydrochlorothiazide (MICROZIDE) 12.5 MG capsule Take 1 capsule (12.5 mg total) by mouth daily.  90 capsule  2   Last reviewed on 02/26/2012  8:06 AM by Luisa Dago, CMA  PE: Blood pressure 155/96, pulse 52, height 5' 5.5" (1.664 m), weight 215 lb (97.523 kg). Gen: Alert, well appearing.  Patient is oriented to person, place, time, and situation. FEET: no deformity, callus, discoloration, edema, or skin breakdown.  Nails normal. DP and PT pulses 2+ bilat.  Sensation intact to monofilament testing bilat.   IMPRESSION AND PLAN:  Type II or unspecified type diabetes mellitus without mention of complication, not stated as uncontrolled Doing well with TLC alone at this time.    Fastings closer to abnl than his postprandials are. Continue CBG monitoring qd-bid and may space this out if control staying excellent. We did discuss possibility of HCTZ 12.5mg  that he has been on having an affect on his sugars, and he may choose to go 1 week off of this med and monitor bp's and glucoses to see what effect this has.  HTN (hypertension) Stable as per home monitoring. See HCTZ info written in prob #1 above.  Exogenous obesity Continue TLC: wt is down 1 lb from last f/u visit.  Dyslipidemia Lab Results  Component Value Date   CHOL 172 09/17/2011   HDL 38.00* 09/17/2011   LDLCALC 108* 09/17/2011   TRIG 129.0 09/17/2011   CHOLHDL 5 09/17/2011   This was at the point that he was dx'd with DM 2. I will give the TLC he is doing a bit more time and then recheck FLP.  If LDL not <100 at lab f/u in 16mo then will discuss starting statin.   An After Visit Summary was printed and given to the patient.  FOLLOW UP: 16mo

## 2012-02-27 NOTE — Assessment & Plan Note (Signed)
Doing well with TLC alone at this time.   Fastings closer to abnl than his postprandials are. Continue CBG monitoring qd-bid and may space this out if control staying excellent. We did discuss possibility of HCTZ 12.5mg  that he has been on having an affect on his sugars, and he may choose to go 1 week off of this med and monitor bp's and glucoses to see what effect this has.

## 2012-02-27 NOTE — Assessment & Plan Note (Signed)
Stable as per home monitoring. See HCTZ info written in prob #1 above.

## 2012-02-27 NOTE — Assessment & Plan Note (Signed)
Lab Results  Component Value Date   CHOL 172 09/17/2011   HDL 38.00* 09/17/2011   LDLCALC 108* 09/17/2011   TRIG 129.0 09/17/2011   CHOLHDL 5 09/17/2011   This was at the point that he was dx'd with DM 2. I will give the TLC he is doing a bit more time and then recheck FLP.  If LDL not <100 at lab f/u in 59mo then will discuss starting statin.

## 2012-02-27 NOTE — Assessment & Plan Note (Signed)
Continue TLC: wt is down 1 lb from last f/u visit.

## 2012-06-27 ENCOUNTER — Other Ambulatory Visit (INDEPENDENT_AMBULATORY_CARE_PROVIDER_SITE_OTHER): Payer: 59

## 2012-06-27 DIAGNOSIS — E119 Type 2 diabetes mellitus without complications: Secondary | ICD-10-CM

## 2012-06-27 LAB — HEMOGLOBIN A1C: Hgb A1c MFr Bld: 6.3 % (ref 4.6–6.5)

## 2012-06-30 ENCOUNTER — Encounter: Payer: Self-pay | Admitting: Family Medicine

## 2012-06-30 ENCOUNTER — Ambulatory Visit (INDEPENDENT_AMBULATORY_CARE_PROVIDER_SITE_OTHER): Payer: 59 | Admitting: Family Medicine

## 2012-06-30 VITALS — BP 160/89 | HR 53 | Temp 97.6°F | Resp 16 | Wt 215.5 lb

## 2012-06-30 DIAGNOSIS — E119 Type 2 diabetes mellitus without complications: Secondary | ICD-10-CM

## 2012-06-30 DIAGNOSIS — I10 Essential (primary) hypertension: Secondary | ICD-10-CM

## 2012-06-30 LAB — COMPREHENSIVE METABOLIC PANEL
Alkaline Phosphatase: 46 U/L (ref 39–117)
BUN: 25 mg/dL — ABNORMAL HIGH (ref 6–23)
Creatinine, Ser: 1.3 mg/dL (ref 0.4–1.5)
Glucose, Bld: 121 mg/dL — ABNORMAL HIGH (ref 70–99)
Total Bilirubin: 0.8 mg/dL (ref 0.3–1.2)

## 2012-06-30 LAB — LIPID PANEL
Cholesterol: 145 mg/dL (ref 0–200)
HDL: 33.4 mg/dL — ABNORMAL LOW (ref >39.00)
LDL Cholesterol: 87 mg/dL (ref 0–99)
VLDL: 24.2 mg/dL (ref 0.0–40.0)

## 2012-06-30 MED ORDER — HYDROCHLOROTHIAZIDE 25 MG PO TABS: 25.0000 mg | ORAL_TABLET | Freq: Every day | ORAL | Status: DC

## 2012-06-30 NOTE — Progress Notes (Signed)
OFFICE NOTE  06/30/2012  CC:  Chief Complaint  Patient presents with  . Follow-up    4-mth. [DM; HTN; Dyslipidemia]     HPI: Patient is a 62 y.o. Caucasian male who is here for 4 mo f/u HTN.   Patient got lab draw at [facility] office several days ago but these have not resulted yet so we couldn't discuss them today (CMET, HbA1c, FLP). Home bp measurements upper limits of normal. CBG monitoring shows fastings avg 118.  Two hour PP avg 110. He tried a brief trial off HCTZ to see if any diff in CBGs and he couldn't tell a difference so he resumed this med.  He usually takes one voltaren per day and this helps his knee stiffness/pain well.  Pertinent PMH:  Past Medical History  Diagnosis Date  . DTaP/IPV/HBV vaccination 2009  . Hx of colonoscopy 2005/ repeat 2010    iFOB negative 09/2011  . Allergy   . Arthritis   . Hypertension   . CAD (coronary artery disease)     CT demonstrated nonobstructive plaques (this test was prompted by borderline ETT.  Marland Kitchen Dyslipidemia   . Obesity   . DM type 2 (diabetes mellitus, type 2) dx'd 08/2011    Fasting gluc 136 and HbA1c 6.7% at diagnosis.     MEDS:  Outpatient Prescriptions Prior to Visit  Medication Sig Dispense Refill  . aspirin 81 MG tablet Take 81 mg by mouth daily.        . diclofenac (VOLTAREN) 75 MG EC tablet Take 1 tablet (75 mg total) by mouth 2 (two) times daily.  60 tablet  3  . hydrochlorothiazide (MICROZIDE) 12.5 MG capsule Take 1 capsule (12.5 mg total) by mouth daily.  90 capsule  2   No facility-administered medications prior to visit.    PE: Blood pressure 160/89, pulse 53, temperature 97.6 F (36.4 C), temperature source Oral, resp. rate 16, weight 215 lb 8 oz (97.75 kg), SpO2 99.00%. Gen: Alert, well appearing.  Patient is oriented to person, place, time, and situation. CV: RRR, no m/r/g.   LUNGS: CTA bilat, nonlabored resps, good aeration in all lung fields.   IMPRESSION AND PLAN:  1) HTN: not ideal control. Will  increase HCTZ to 25mg  qd. He has cardiology f/u in 628mo. I'll see him back for f/u in 28mo. Will call pt with lab results when they come in.  2) DM 2: will f/u labs done recently--not resulted yet. I reminded pt that he needs annual ophthalm screening exam for diabetic retinopathy.   We discussed need for pneumovax but he declined it for now.   FOLLOW UP: 28mo

## 2012-06-30 NOTE — Patient Instructions (Addendum)
Tell you eye doctor you need a diabetic retinopathy screening exam

## 2012-07-04 ENCOUNTER — Telehealth: Payer: Self-pay | Admitting: *Deleted

## 2012-07-04 NOTE — Telephone Encounter (Signed)
Notes Recorded by Regis Bill, CMA on 07/01/2012 at 8:55 AM [facility] with contact name and number for return call RE: results and further provider instructions/SLS  Patient informed; lab order for kidney function panel &  lipid panel pending, as patient would like to discuss with provider [request call], so that he may inquire about Cholesterol findings before starting medication/SLS

## 2012-07-04 NOTE — Telephone Encounter (Signed)
Message copied by Regis Bill on Fri Jul 04, 2012  3:39 PM ------      Message from: Jeoffrey Massed      Created: Mon Jun 30, 2012 11:49 PM       Pls notify pt that his HbA1c was down to 6.3%, which is excellent.  Keep up the great work.      His cholesterol numbers were a little better than last check, but according to the latest guidelines for cholesterol management, I recommend that he start a cholesterol-lowering med called atorvastatin.  If he's agreeable, let me know and I'll send in rx and we'll recheck fasting lipid panel in 32mo (lab visit only).  Will also recheck kidney panel at that time to assure stability of kidney function and electrolytes since we increased his HCTZ to 25mg  daily.      Let me know--thx ------

## 2012-07-05 ENCOUNTER — Encounter: Payer: Self-pay | Admitting: Family Medicine

## 2012-07-10 ENCOUNTER — Other Ambulatory Visit: Payer: Self-pay | Admitting: Cardiology

## 2012-09-04 ENCOUNTER — Other Ambulatory Visit: Payer: Self-pay

## 2012-12-24 ENCOUNTER — Other Ambulatory Visit: Payer: Self-pay | Admitting: Family Medicine

## 2012-12-24 DIAGNOSIS — Z125 Encounter for screening for malignant neoplasm of prostate: Secondary | ICD-10-CM

## 2012-12-24 DIAGNOSIS — Z0000 Encounter for general adult medical examination without abnormal findings: Secondary | ICD-10-CM

## 2012-12-24 DIAGNOSIS — E785 Hyperlipidemia, unspecified: Secondary | ICD-10-CM

## 2012-12-24 DIAGNOSIS — E119 Type 2 diabetes mellitus without complications: Secondary | ICD-10-CM

## 2012-12-29 ENCOUNTER — Other Ambulatory Visit: Payer: Self-pay | Admitting: Family Medicine

## 2013-01-06 ENCOUNTER — Other Ambulatory Visit (INDEPENDENT_AMBULATORY_CARE_PROVIDER_SITE_OTHER): Payer: 59

## 2013-01-06 DIAGNOSIS — E785 Hyperlipidemia, unspecified: Secondary | ICD-10-CM

## 2013-01-06 DIAGNOSIS — E119 Type 2 diabetes mellitus without complications: Secondary | ICD-10-CM

## 2013-01-06 DIAGNOSIS — Z125 Encounter for screening for malignant neoplasm of prostate: Secondary | ICD-10-CM

## 2013-01-06 DIAGNOSIS — Z0000 Encounter for general adult medical examination without abnormal findings: Secondary | ICD-10-CM

## 2013-01-06 LAB — COMPREHENSIVE METABOLIC PANEL
BUN: 23 mg/dL (ref 6–23)
CO2: 31 mEq/L (ref 19–32)
Calcium: 9.7 mg/dL (ref 8.4–10.5)
Chloride: 101 mEq/L (ref 96–112)
Creatinine, Ser: 1 mg/dL (ref 0.4–1.5)
GFR: 77.26 mL/min (ref >60.00)
Glucose, Bld: 132 mg/dL — ABNORMAL HIGH (ref 70–99)
Total Bilirubin: 0.8 mg/dL (ref 0.3–1.2)

## 2013-01-06 LAB — CBC WITH DIFFERENTIAL/PLATELET
Basophils Relative: 0.7 % (ref 0.0–3.0)
Eosinophils Relative: 2 % (ref 0.0–5.0)
HCT: 43.7 % (ref 39.0–52.0)
Lymphs Abs: 1.7 10*3/uL (ref 0.7–4.0)
Monocytes Relative: 12 % (ref 3.0–12.0)
Neutrophils Relative %: 55.2 % (ref 43.0–77.0)
Platelets: 223 10*3/uL (ref 150.0–400.0)
RBC: 4.74 Mil/uL (ref 4.22–5.81)
WBC: 5.5 10*3/uL (ref 4.5–10.5)

## 2013-01-06 LAB — LIPID PANEL
Cholesterol: 164 mg/dL (ref 0–200)
HDL: 36.2 mg/dL — ABNORMAL LOW (ref >39.00)
LDL Cholesterol: 93 mg/dL (ref 0–99)
Triglycerides: 175 mg/dL — ABNORMAL HIGH (ref 0.0–149.0)
VLDL: 35 mg/dL (ref 0.0–40.0)

## 2013-01-06 LAB — TSH: TSH: 3.62 u[IU]/mL (ref 0.35–5.50)

## 2013-01-06 LAB — PSA: PSA: 0.53 ng/mL (ref 0.10–4.00)

## 2013-01-06 LAB — HEMOGLOBIN A1C: Hgb A1c MFr Bld: 6.7 % — ABNORMAL HIGH (ref 4.6–6.5)

## 2013-01-12 ENCOUNTER — Ambulatory Visit (INDEPENDENT_AMBULATORY_CARE_PROVIDER_SITE_OTHER): Payer: 59 | Admitting: Family Medicine

## 2013-01-12 ENCOUNTER — Encounter: Payer: Self-pay | Admitting: Family Medicine

## 2013-01-12 VITALS — BP 153/87 | HR 58 | Temp 98.1°F | Resp 18 | Ht 65.5 in | Wt 216.0 lb

## 2013-01-12 DIAGNOSIS — Z0000 Encounter for general adult medical examination without abnormal findings: Secondary | ICD-10-CM

## 2013-01-12 MED ORDER — HYDROCHLOROTHIAZIDE 25 MG PO TABS: 25.0000 mg | ORAL_TABLET | Freq: Every day | ORAL | Status: DC

## 2013-01-12 MED ORDER — GLUCOSE BLOOD VI STRP: ORAL_STRIP | Status: DC

## 2013-01-12 MED ORDER — DICLOFENAC SODIUM 75 MG PO TBEC: DELAYED_RELEASE_TABLET | ORAL | Status: DC

## 2013-01-12 NOTE — Assessment & Plan Note (Signed)
Reviewed age and gender appropriate health maintenance issues (prudent diet, regular exercise, health risks of tobacco and excessive alcohol, use of seatbelts, fire alarms in home, use of sunscreen).  Also reviewed age and gender appropriate health screening as well as vaccine recommendations. LDL <100, discussed goal of <70, offered statin trial but pt opted for ongoing TLC modification to try to get HDL up and LDL down. Low chol/fat diet handout given today. Doing well with diet-only for DM 2.   Bp well controlled per home measurements.

## 2013-01-12 NOTE — Progress Notes (Signed)
Office Note 01/12/2013  CC:  Chief Complaint  Patient presents with  . Annual Exam    HPI:  Michael Hodge is a 62 y.o. White male who is here for CPE.   Home bp measurements: avg 130/75/ Home glucoses: fasting avg 125.  Two hour PP avg 140s.  Nothing over 180. We reviewed all his fasting labs in detail today. No acute complaints   Past Medical History  Diagnosis Date  . DTaP/IPV/HBV vaccination 2009  . Hx of colonoscopy 2005/ 2012    iFOB negative 09/2011  . Allergy   . Arthritis   . Hypertension   . CAD (coronary artery disease)     CT demonstrated nonobstructive plaques (this test was prompted by borderline ETT.  Marland Kitchen Dyslipidemia   . Obesity   . DM type 2 (diabetes mellitus, type 2) dx'd 08/2011    Fasting gluc 136 and HbA1c 6.7% at diagnosis.   Marland Kitchen Hx of adenomatous colonic polyps     Past Surgical History  Procedure Laterality Date  . Remvoal sebaceous cyst  15-20 years ago  . Tonsillectomy    . Colonoscopy w/ polypectomy  08/21/10    adenomatous; also mild diverticulosis; repeat 2017.    Family History  Problem Relation Age of Onset  . Stroke Other   . Diabetes Father   . Stroke Father   . Heart attack Mother     History   Social History  . Marital Status: Married    Spouse Name: N/A    Number of Children: N/A  . Years of Education: N/A   Occupational History  . Not on file.   Social History Main Topics  . Smoking status: Never Smoker   . Smokeless tobacco: Never Used  . Alcohol Use: 1.8 oz/week    3 Shots of liquor per week     Comment: occ  . Drug Use: No  . Sexual Activity: Yes   Other Topics Concern  . Not on file   Social History Narrative   Married x 40 yrs.   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   No T/A/Ds.    Outpatient Prescriptions Prior to Visit  Medication Sig Dispense Refill  . aspirin 81 MG tablet Take 81 mg by mouth daily.        . diclofenac (VOLTAREN) 75 MG EC tablet TAKE 1 TABLET (75 MG  TOTAL) BY MOUTH 2 (TWO) TIMES DAILY.  60 tablet  2  . hydrochlorothiazide (HYDRODIURIL) 25 MG tablet Take 1 tablet (25 mg total) by mouth daily.  30 tablet  11   No facility-administered medications prior to visit.    No Known Allergies  ROS Review of Systems  Constitutional: Negative for fever, chills, appetite change and fatigue.  HENT: Negative for congestion, dental problem, ear pain and sore throat.   Eyes: Negative for discharge, redness and visual disturbance.  Respiratory: Negative for cough, chest tightness, shortness of breath and wheezing.   Cardiovascular: Negative for chest pain, palpitations and leg swelling.  Gastrointestinal: Negative for nausea, vomiting, abdominal pain, diarrhea and blood in stool.  Genitourinary: Negative for dysuria, urgency, frequency, hematuria, flank pain and difficulty urinating.  Musculoskeletal: Negative for arthralgias, back pain, joint swelling, myalgias and neck stiffness.  Skin: Negative for pallor and rash.  Neurological: Negative for dizziness, speech difficulty, weakness and headaches.  Hematological: Negative for adenopathy. Does not bruise/bleed easily.  Psychiatric/Behavioral: Negative for confusion and sleep disturbance. The patient is not nervous/anxious.  PE; Blood pressure 153/87, pulse 58, temperature 98.1 F (36.7 C), temperature source Temporal, resp. rate 18, height 5' 5.5" (1.664 m), weight 216 lb (97.977 kg), SpO2 98.00%. Gen: Alert, well appearing, overweight-appearing.  Patient is oriented to person, place, time, and situation. AFFECT: pleasant, lucid thought and speech. ENT: Ears: EACs clear, normal epithelium.  TMs with good light reflex and landmarks bilaterally.  Eyes: no injection, icteris, swelling, or exudate.  EOMI, PERRLA. Nose: no drainage or turbinate edema/swelling.  No injection or focal lesion.  Mouth: lips without lesion/swelling.  Oral mucosa pink and moist.  Dentition intact and without obvious caries  or gingival swelling.  Oropharynx without erythema, exudate, or swelling.  Neck: supple/nontender.  No LAD, mass, or TM.  Carotid pulses 2+ bilaterally, without bruits. CV: RRR, no m/r/g.   LUNGS: CTA bilat, nonlabored resps, good aeration in all lung fields. ABD: soft, NT, ND, BS normal.  No hepatospenomegaly or mass.  No bruits. EXT: no clubbing, cyanosis, or edema.  Musculoskeletal: no joint swelling, erythema, warmth, or tenderness.  ROM of all joints intact. Skin - no sores or suspicious lesions or rashes or color changes   Pertinent labs:  Lab Results  Component Value Date   TSH 3.62 01/06/2013   Lab Results  Component Value Date   WBC 5.5 01/06/2013   HGB 14.8 01/06/2013   HCT 43.7 01/06/2013   MCV 92.3 01/06/2013   PLT 223.0 01/06/2013   Lab Results  Component Value Date   CREATININE 1.0 01/06/2013   BUN 23 01/06/2013   NA 137 01/06/2013   K 3.4* 01/06/2013   CL 101 01/06/2013   CO2 31 01/06/2013   Lab Results  Component Value Date   ALT 27 01/06/2013   AST 23 01/06/2013   ALKPHOS 42 01/06/2013   BILITOT 0.8 01/06/2013   Lab Results  Component Value Date   CHOL 164 01/06/2013   Lab Results  Component Value Date   HDL 36.20* 01/06/2013   Lab Results  Component Value Date   LDLCALC 93 01/06/2013   Lab Results  Component Value Date   TRIG 175.0* 01/06/2013   Lab Results  Component Value Date   CHOLHDL 5 01/06/2013   Lab Results  Component Value Date   PSA 0.53 01/06/2013   PSA 0.44 09/17/2011   PSA 0.57 07/05/2010    ASSESSMENT AND PLAN:   Health maintenance examination Reviewed age and gender appropriate health maintenance issues (prudent diet, regular exercise, health risks of tobacco and excessive alcohol, use of seatbelts, fire alarms in home, use of sunscreen).  Also reviewed age and gender appropriate health screening as well as vaccine recommendations. LDL <100, discussed goal of <70, offered statin trial but pt opted for ongoing TLC  modification to try to get HDL up and LDL down. Low chol/fat diet handout given today. Doing well with diet-only for DM 2.   Bp well controlled per home measurements.   An After Visit Summary was printed and given to the patient.  FOLLOW UP:  Return in about 6 months (around 07/12/2013) for f/u DM and HTN.

## 2013-02-09 ENCOUNTER — Ambulatory Visit (INDEPENDENT_AMBULATORY_CARE_PROVIDER_SITE_OTHER): Payer: 59 | Admitting: Physician Assistant

## 2013-02-09 DIAGNOSIS — E119 Type 2 diabetes mellitus without complications: Secondary | ICD-10-CM

## 2013-02-09 DIAGNOSIS — I2510 Atherosclerotic heart disease of native coronary artery without angina pectoris: Secondary | ICD-10-CM

## 2013-02-09 NOTE — Progress Notes (Signed)
Patient ID: Michael Hodge, male   DOB: [birth year], 62 y.o.   MRN: [id number] Exercise Treadmill Test  Pre-Exercise Testing Evaluation Rhythm: sinus bradycardia  Rate: 53     Test  Exercise Tolerance Test Ordering MD: Angelina Sheriff, MD  Interpreting MD: Tereso Newcomer, PA-C  Unique Test No: 1 Treadmill:  1  Indication for ETT: known ASHD  Contraindication to ETT: No   Stress Modality: exercise - treadmill  Cardiac Imaging Performed: non   Protocol: standard Bruce - maximal  Max BP:  217/101  Max MPHR (bpm):  156 85% MPR (bpm):  133  MPHR obtained (bpm):  157 % MPHR obtained:  101  Reached 85% MPHR (min:sec):  5:06 Total Exercise Time (min-sec):  8:00  Workload in METS:  10.1 Borg Scale: 15  Reason ETT Terminated:  desired heart rate attained    ST Segment Analysis At Rest: normal ST segments - no evidence of significant ST depression With Exercise: significant ischemic ST depression  Other Information Arrhythmia:  frequent PVCs; rare ventricular couplets Angina during ETT:  absent (0) Quality of ETT:  diagnostic  ETT Interpretation:  abnormal - evidence of ST depression consistent with ischemia  Comments: Good exercise capacity. No chest pain. Normal BP response to exercise. There was significant ST depression at peak exercise. There were frequent PVCs during early exercise and recovery.  There were rare ventricular couplets.  These were asymptomatic. Good HR recovery in 1st minute post exercise at 2 mph on 2% grade.   Recommendations: Will review with Dr. Rollene Rotunda regarding further testing, if any. Discussed with patient. Signed, Tereso Newcomer, PA-C   02/09/2013 9:38 AM

## 2013-02-13 ENCOUNTER — Encounter: Payer: Self-pay | Admitting: Physician Assistant

## 2013-02-24 ENCOUNTER — Telehealth: Payer: Self-pay | Admitting: Physician Assistant

## 2013-02-24 DIAGNOSIS — I10 Essential (primary) hypertension: Secondary | ICD-10-CM

## 2013-02-24 DIAGNOSIS — R9439 Abnormal result of other cardiovascular function study: Secondary | ICD-10-CM

## 2013-02-24 DIAGNOSIS — I2510 Atherosclerotic heart disease of native coronary artery without angina pectoris: Secondary | ICD-10-CM

## 2013-02-24 NOTE — Telephone Encounter (Signed)
Please notify patient that I reviewed stress test with Dr. Rollene Rotunda and we reviewed his old study as well.  The changes on his current study are similar but seem more prominent than 2012.  We recommend he undergo a stress perfusion study to assess him further.  Please arrange a LexiScan Myoview. Tereso Newcomer, PA-C   02/24/2013 1:31 PM

## 2013-02-24 NOTE — Telephone Encounter (Signed)
New message     Pt had stress test with Lorin Picket and he was going to discuss it with Dr Antoine Poche and call him back.  He has not heard anything.

## 2013-02-25 NOTE — Telephone Encounter (Signed)
Pt notified per Tereso Newcomer, [facility] and Dr. Antoine Poche that he needs Lexiscan, due to recent GXT shows similar study compared to 2012 however more prominent with ischemia. Pt Lexiscan is 03/12/13 7:45. Went over instructions by phone and will mail as well

## 2013-02-25 NOTE — Telephone Encounter (Signed)
Pt notified per Michael Hodge, PAC and Dr. Hochrein that he needs Lexiscan, due to recent GXT shows similar study compared to 2012 however more prominent with ischemia. Pt Lexiscan is 03/12/13 7:45. Went over instructions by phone and will mail as well     

## 2013-02-26 HISTORY — PX: CARDIOVASCULAR STRESS TEST: SHX262

## 2013-02-28 IMAGING — CT CT HEART MORP W/ CTA COR W/ SCORE W/ CA W/CM &/OR W/O CM
3 of 7 series · 9 of 20 positions shown, 10 images · IV contrast (CONTRAST)
Comparison: CT abdomen 03/03/2005

OVER-READ INTERPRETATION - CT CHEST

The following report is an over-read performed by radiologist Dr.
[DATE].  This over-read does not include interpretation of
cardiac or coronary anatomy or pathology.  The CTA interpretation
by the cardiologist is attached.
INDICATION: Abnormal ETT and dyspnea
PROTOCOL: The patient was scanned on a Philips 256 scanner.
Collimation was .9mm and gantry rotation speed 788msec.  The
patient received 10mg of iv lopresser and 2 sl nitro.  Average HR
during scan was 54 bpm.  A prospectively triggered scan was done
centered around 78% of the R-R interval.  The patient received 80cc
of contrast.  The 3D data set was sent to a Philips work station
for review using MPR, MIP and VRT modes

[Series 8: w/ edge cor., 78.0% · axial · 0.49mm/px · z∈[-271,-209]mm · 3 of 276 slices shown, 4 images]
[im 69/276  vessel]
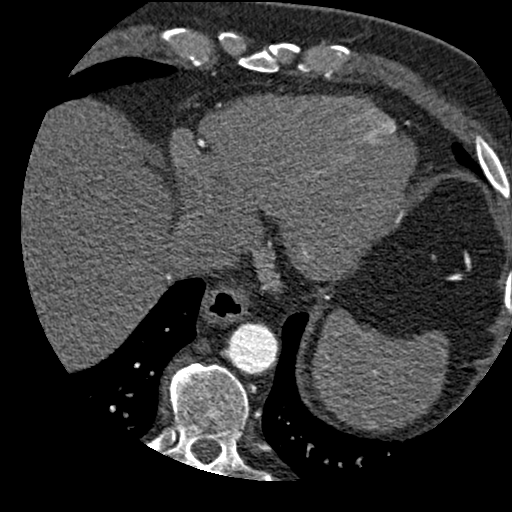
[im 69/276  lung]
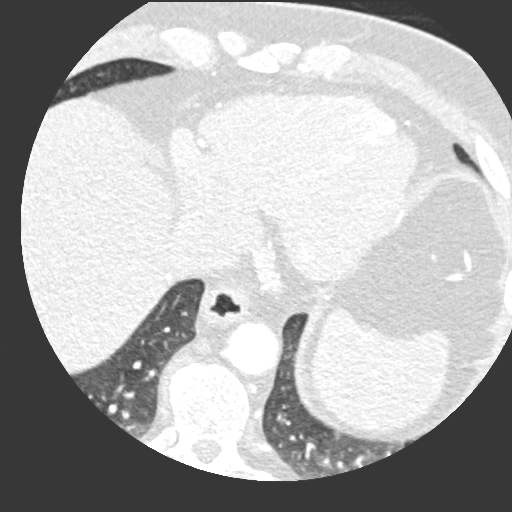
[im 138/276  vessel]
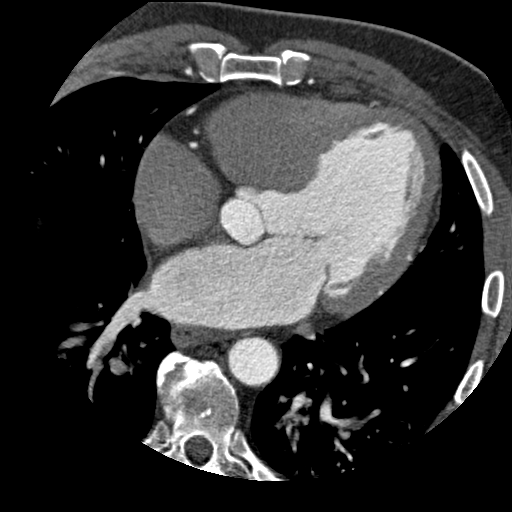
[im 207/276  vessel]
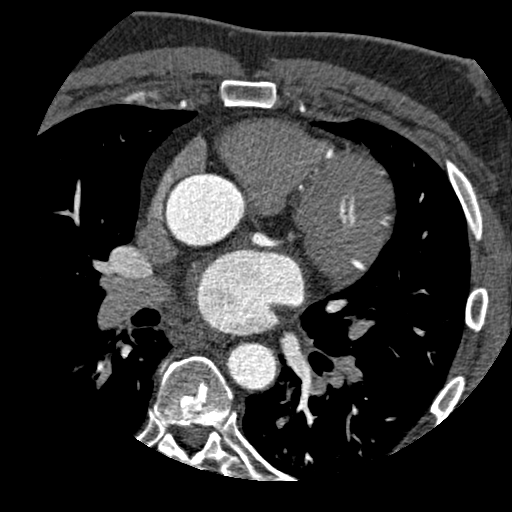

[Series 10: w/ edge corr., 73.0% · axial · 0.49mm/px · z∈[-271,-209]mm · 3 of 276 slices shown]
[im 69/276  lung]
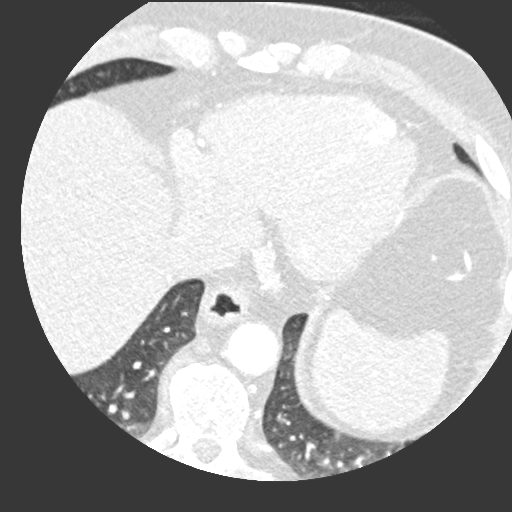
[im 138/276  lung]
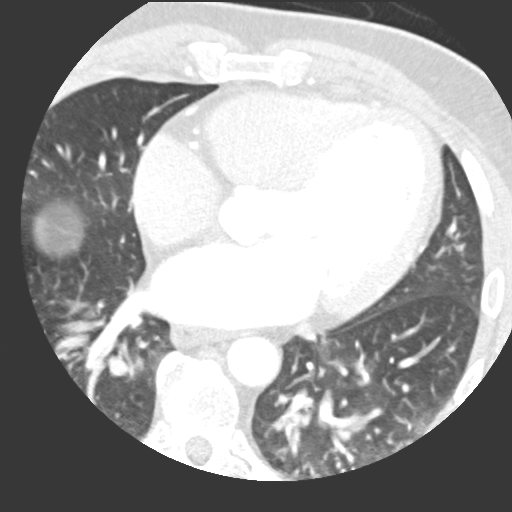
[im 207/276  lung]
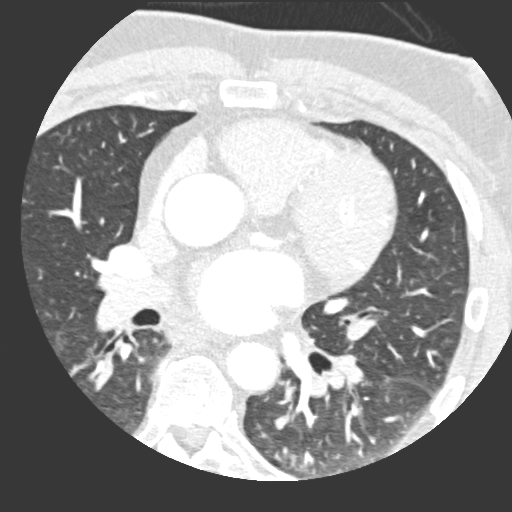

[Series 12: w/ edge corr., 83.0% · axial · 0.49mm/px · z∈[-271,-209]mm · 3 of 276 slices shown]
[im 69/276  lung]
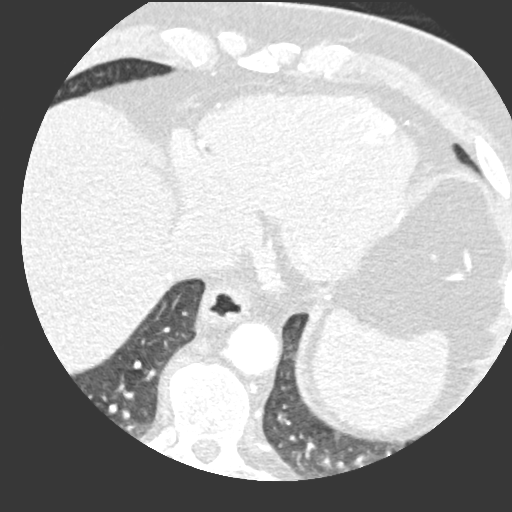
[im 138/276  lung]
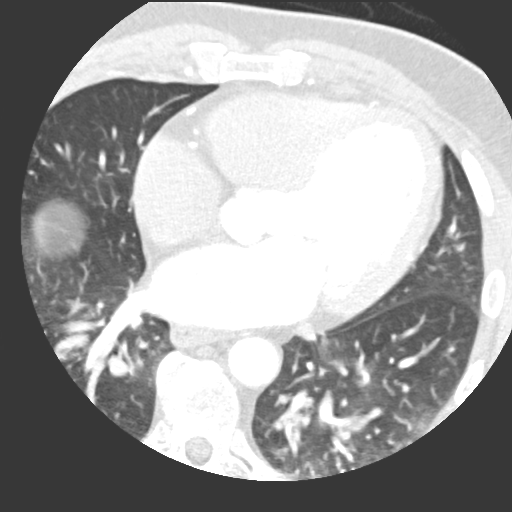
[im 207/276  lung]
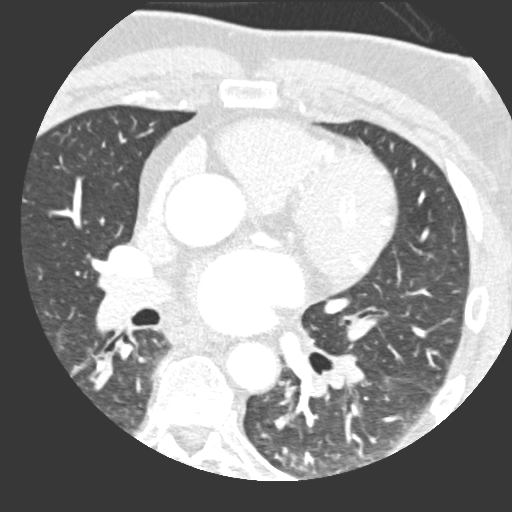

[9 of 20 positions shown; findings below may reference images not displayed]

FINDINGS: Minimal ground-glass densities dependently in the lungs,
likely atelectasis.  There are no pleural effusions.  Visualized
aorta is normal caliber.  No adenopathy in the visualized
mediastinum or hila.  Imaging into the upper abdomen shows no acute
findings.  No acute bony abnormality.
IMPRESSION: No significant extracardiac abnormality.

Cardiac CT:
FINDINGS: Calcium score  44.5 with discrete foci in the ostial RCA and
proximal LAD at the take off of D1

Coronary CTA:  Right dominant with no anomaly.  LM- less than 20%
calcific disease, LAD- less than 30% mixed plaque in the proximal
segment, normal mid and distal.  D1-large vessel with less than 30%
calcific stenosis ostially., D2-normal, D3-small and normal.
Cirucmflex-normal, OM1, OM2- normal, RCA - dominant and normal.

Noncardiac: Soft tissue and lung windows reviewed with no
significant findings.  See separate report from [HOSPITAL]
IMPRESSION: 1)    Calcium score

2)    Right dominant coronary arteries without significant
stenosis.  See narrative above.  ASA and LDL goal under 100 given
mixed plaque in proximal LAD and calcium score
Copy to Dr Doktors and Nomasibulele Moatshe PA

## 2013-03-12 ENCOUNTER — Ambulatory Visit (HOSPITAL_COMMUNITY): Payer: 59 | Attending: Cardiology | Admitting: Radiology

## 2013-03-12 ENCOUNTER — Encounter: Payer: Self-pay | Admitting: Cardiology

## 2013-03-12 VITALS — BP 135/85 | HR 44 | Ht 66.0 in | Wt 216.0 lb

## 2013-03-12 DIAGNOSIS — I2510 Atherosclerotic heart disease of native coronary artery without angina pectoris: Secondary | ICD-10-CM

## 2013-03-12 DIAGNOSIS — R9439 Abnormal result of other cardiovascular function study: Secondary | ICD-10-CM

## 2013-03-12 DIAGNOSIS — Z8249 Family history of ischemic heart disease and other diseases of the circulatory system: Secondary | ICD-10-CM | POA: Insufficient documentation

## 2013-03-12 DIAGNOSIS — I10 Essential (primary) hypertension: Secondary | ICD-10-CM

## 2013-03-12 DIAGNOSIS — E119 Type 2 diabetes mellitus without complications: Secondary | ICD-10-CM | POA: Insufficient documentation

## 2013-03-12 MED ORDER — TECHNETIUM TC 99M SESTAMIBI GENERIC - CARDIOLITE
10.8000 | Freq: Once | INTRAVENOUS | Status: AC | PRN
  Administered 2013-03-12: 11 via INTRAVENOUS

## 2013-03-12 MED ORDER — TECHNETIUM TC 99M SESTAMIBI GENERIC - CARDIOLITE
33.0000 | Freq: Once | INTRAVENOUS | Status: AC | PRN
  Administered 2013-03-12: 33 via INTRAVENOUS

## 2013-03-12 MED ORDER — REGADENOSON 0.4 MG/5ML IV SOLN
0.4000 mg | Freq: Once | INTRAVENOUS | Status: AC
  Administered 2013-03-12: 0.4 mg via INTRAVENOUS

## 2013-03-12 NOTE — Progress Notes (Addendum)
[facility] [address] [place], [place] [address] [phone]    Cardiology Nuclear Med Study  Michael Hodge is a 63 y.o. male     MRN : [id number]     DOB: March 04, 1950  Procedure Date: 03/12/2013  Nuclear Med Background Indication for Stress Test:  Evaluation for Ischemia and Abnormal GXT History:  CAD Cardiac Risk Factors: Family History - CAD, Hypertension, Lipids and NIDDM  Symptoms:  None indicated   Nuclear Pre-Procedure Caffeine/Decaff Intake:  None NPO After: 11:00pm   Lungs:  clear O2 Sat: 96% on room air. IV 0.9% NS with Angio Cath:  22g  IV Site: R Hand  IV Started by:  Crissie Figures, RN  Chest Size (in):  50 Cup Size: n/a  Height: 5\' 6"  (1.676 m)  Weight:  216 lb (97.977 kg)  BMI:  Body mass index is 34.88 kg/(m^2). Tech Comments:  N/A    Nuclear Med Study 1 or 2 day study: 1 day  Stress Test Type:  Treadmill/Lexiscan  Reading MD: N/A  Order Authorizing Provider:  Minus Breeding, MD  Resting Radionuclide: Technetium 71m Sestamibi  Resting Radionuclide Dose: 11.0 mCi   Stress Radionuclide:  Technetium 16m Sestamibi  Stress Radionuclide Dose: 33.0 mCi           Stress Protocol Rest HR: 44 Stress HR: 99  Rest BP: 135/85 Stress BP: 168/84  Exercise Time (min): n/a METS: n/a           Dose of Adenosine (mg):  n/a Dose of Lexiscan: 0.4 mg  Dose of Atropine (mg): n/a Dose of Dobutamine: n/a mcg/kg/min (at max HR)  Stress Test Technologist: Glade Lloyd, BS-ES  Nuclear Technologist:  Charlton Amor, CNMT     Rest Procedure:  Myocardial perfusion imaging was performed at rest 45 minutes following the intravenous administration of Technetium 23m Sestamibi. Rest ECG: Sinus bradycardia, 44bpm, no ST changes.   Stress Procedure:  The patient received IV Lexiscan 0.4 mg over 15-seconds with concurrent low level exercise and then Technetium 8m Sestamibi was injected at 30-seconds while the patient continued walking one more  minute.  Quantitative spect images were obtained after a 45-minute delay.  During the infusion of Lexiscan, the patient felt like his heart was speeding up.  This resolved in recovery.  Stress ECG: Non specific ST changes with occasional PVC's.   QPS Raw Data Images:  Normal; no motion artifact; normal heart/lung ratio. Stress Images:  Normal homogeneous uptake in all areas of the myocardium. Rest Images:  Normal homogeneous uptake in all areas of the myocardium. Subtraction (SDS):  Normal Transient Ischemic Dilatation (Normal <1.22):  1.04 Lung/Heart Ratio (Normal <0.45):  0.27  Quantitative Gated Spect Images QGS EDV:  119 ml QGS ESV:  56 ml  Impression Exercise Capacity:  Lexiscan with low level exercise. BP Response:  Normal blood pressure response. Clinical Symptoms:  Palpitations, no chest pain.  ECG Impression:  Non specific ST changes. No evidence of ischemia. PVC's (symptomatic).  Comparison with Prior Nuclear Study: No images to compare  Overall Impression:  Low risk stress nuclear study no ischemia. .  LV Ejection Fraction: 53%.  LV Wall Motion:  NL LV Function; NL Wall Motion.  Occasional symptomatic PVC's.      Candee Furbish, MD

## 2013-03-13 ENCOUNTER — Encounter: Payer: Self-pay | Admitting: Physician Assistant

## 2013-03-16 ENCOUNTER — Encounter: Payer: Self-pay | Admitting: Family Medicine

## 2013-06-15 ENCOUNTER — Other Ambulatory Visit: Payer: Self-pay | Admitting: Family Medicine

## 2013-06-15 DIAGNOSIS — E785 Hyperlipidemia, unspecified: Secondary | ICD-10-CM

## 2013-06-15 DIAGNOSIS — E119 Type 2 diabetes mellitus without complications: Secondary | ICD-10-CM

## 2013-06-15 DIAGNOSIS — I10 Essential (primary) hypertension: Secondary | ICD-10-CM

## 2013-06-16 ENCOUNTER — Telehealth: Payer: Self-pay | Admitting: Family Medicine

## 2013-06-16 NOTE — Telephone Encounter (Signed)
Relevant patient education assigned to patient using Emmi. ° °

## 2013-07-01 ENCOUNTER — Encounter: Payer: Self-pay | Admitting: Family Medicine

## 2013-07-01 ENCOUNTER — Other Ambulatory Visit (INDEPENDENT_AMBULATORY_CARE_PROVIDER_SITE_OTHER): Payer: 59

## 2013-07-01 DIAGNOSIS — I10 Essential (primary) hypertension: Secondary | ICD-10-CM

## 2013-07-01 DIAGNOSIS — E119 Type 2 diabetes mellitus without complications: Secondary | ICD-10-CM

## 2013-07-01 DIAGNOSIS — E785 Hyperlipidemia, unspecified: Secondary | ICD-10-CM

## 2013-07-01 LAB — LIPID PANEL
CHOL/HDL RATIO: 5
Cholesterol: 180 mg/dL (ref 0–200)
HDL: 34.7 mg/dL — ABNORMAL LOW (ref >39.00)
LDL CALC: 105 mg/dL — AB (ref 0–99)
Triglycerides: 202 mg/dL — ABNORMAL HIGH (ref 0.0–149.0)
VLDL: 40.4 mg/dL — ABNORMAL HIGH (ref 0.0–40.0)

## 2013-07-01 LAB — BASIC METABOLIC PANEL
BUN: 28 mg/dL — ABNORMAL HIGH (ref 6–23)
CO2: 30 mEq/L (ref 19–32)
CREATININE: 1.2 mg/dL (ref 0.4–1.5)
Calcium: 9.6 mg/dL (ref 8.4–10.5)
Chloride: 99 mEq/L (ref 96–112)
GFR: 64.68 mL/min (ref >60.00)
Glucose, Bld: 139 mg/dL — ABNORMAL HIGH (ref 70–99)
Potassium: 3.3 mEq/L — ABNORMAL LOW (ref 3.5–5.1)
Sodium: 138 mEq/L (ref 135–145)

## 2013-07-01 LAB — HEMOGLOBIN A1C: Hgb A1c MFr Bld: 6.9 % — ABNORMAL HIGH (ref 4.6–6.5)

## 2013-07-02 ENCOUNTER — Ambulatory Visit (INDEPENDENT_AMBULATORY_CARE_PROVIDER_SITE_OTHER): Payer: 59 | Admitting: Family Medicine

## 2013-07-02 ENCOUNTER — Encounter: Payer: Self-pay | Admitting: Family Medicine

## 2013-07-02 ENCOUNTER — Ambulatory Visit: Payer: 59 | Admitting: Family Medicine

## 2013-07-02 VITALS — BP 179/94 | HR 52 | Temp 97.8°F | Resp 18 | Ht 65.5 in | Wt 217.0 lb

## 2013-07-02 DIAGNOSIS — E785 Hyperlipidemia, unspecified: Secondary | ICD-10-CM

## 2013-07-02 DIAGNOSIS — E876 Hypokalemia: Secondary | ICD-10-CM

## 2013-07-02 DIAGNOSIS — E119 Type 2 diabetes mellitus without complications: Secondary | ICD-10-CM

## 2013-07-02 DIAGNOSIS — I10 Essential (primary) hypertension: Secondary | ICD-10-CM

## 2013-07-02 DIAGNOSIS — M5430 Sciatica, unspecified side: Secondary | ICD-10-CM

## 2013-07-02 MED ORDER — PREDNISONE 20 MG PO TABS: ORAL_TABLET | ORAL | Status: DC

## 2013-07-02 MED ORDER — ATORVASTATIN CALCIUM 40 MG PO TABS: 40.0000 mg | ORAL_TABLET | Freq: Every day | ORAL | Status: DC

## 2013-07-02 MED ORDER — POTASSIUM CHLORIDE CRYS ER 20 MEQ PO TBCR: EXTENDED_RELEASE_TABLET | ORAL | Status: DC

## 2013-07-02 MED ORDER — HYDROCODONE-ACETAMINOPHEN 5-325 MG PO TABS: ORAL_TABLET | ORAL | Status: DC

## 2013-07-02 NOTE — Progress Notes (Signed)
OFFICE NOTE  07/02/2013  CC:  Chief Complaint  Patient presents with  . Hip Pain    last 2 days  . Leg Pain     HPI: Patient is a 63 y.o. Caucasian male who is here for hip pain.   Onset right hip/glut region pain a few days ago, as time has gone on it has started to radiate down right leg, some chronic numbness in right great toe has gotten a bit worse with this.  Pain worse last night, hardly slept. Took tylenol 1000 mg tylenol yest, took 325mg  ASA today--he's not sure if it helped b/c the natural hx of this is that it waxes and wanes.  Worse with lying supine and when walking.    Occ glucose check shows fasting 120s-130s.    Home bps normal per pt report.  Pertinent PMH:  Past medical, surgical, social, and family history reviewed and no changes are noted since last office visit.  MEDS:  Outpatient Prescriptions Prior to Visit  Medication Sig Dispense Refill  . aspirin 81 MG tablet Take 81 mg by mouth daily.        . diclofenac (VOLTAREN) 75 MG EC tablet TAKE 1 TABLET (75 MG TOTAL) BY MOUTH 2 (TWO) TIMES DAILY as needed  60 tablet  5  . glucose blood (FREESTYLE LITE) test strip Use as instructed  32 each  12  . hydrochlorothiazide (HYDRODIURIL) 25 MG tablet Take 1 tablet (25 mg total) by mouth daily.  30 tablet  11   No facility-administered medications prior to visit.    PE: Blood pressure 179/94, pulse 52, temperature 97.8 F (36.6 C), temperature source Temporal, resp. rate 18, height 5' 5.5" (1.664 m), weight 217 lb (98.431 kg), SpO2 100.00%.    Gen: Alert, well appearing.  Patient is oriented to person, place, time, and situation. CV: RRR, no m/r/g.   LUNGS: CTA bilat, nonlabored resps, good aeration in all lung fields. Back: nontender.  ROM intact but he feels pulling in post aspect R leg with extreme forward flexion. Mild TTP over right ischial tuberosity.  Minimal TTP along lateral aspect of hip and glut/hamstring in general. Sitting SLR elicits pulling pain in  right glut and hamstring. LE strength intact and symmetric.  DTRs intact and symmetric.  No sensory deficits.  LABS:    Chemistry      Component Value Date/Time   NA 138 07/01/2013 0753   K 3.3* 07/01/2013 0753   CL 99 07/01/2013 0753   CO2 30 07/01/2013 0753   BUN 28* 07/01/2013 0753   CREATININE 1.2 07/01/2013 0753      Component Value Date/Time   CALCIUM 9.6 07/01/2013 0753   ALKPHOS 42 01/06/2013 0814   AST 23 01/06/2013 0814   ALT 27 01/06/2013 0814   BILITOT 0.8 01/06/2013 0814     Lab Results  Component Value Date   CHOL 180 07/01/2013   HDL 34.70* 07/01/2013   LDLCALC 105* 07/01/2013   TRIG 202.0* 07/01/2013   CHOLHDL 5 07/01/2013   Lab Results  Component Value Date   HGBA1C 6.9* 07/01/2013    IMPRESSION AND PLAN:  Sciatica Prednisone 40 qd x 5d. Vicodin 5/325, 1-2 tabs po q6h prn pain.  Hypokalemia Potassium consistently low-normal and occasionally mildly low on BMETs consistently. Start KDUR 20 mEQ, 2 tabs po qd. Recheck BMET 2 wks.  Dyslipidemia Start atorvastatin 40 mg po qd.  Type II or unspecified type diabetes mellitus without mention of complication, not stated as uncontrolled HbA1c  just a tad bit up. Continue to work hard on diet, increase activity. No meds at this time.   An After Visit Summary was printed and given to the patient.  FOLLOW UP: 6 mo

## 2013-07-04 DIAGNOSIS — E876 Hypokalemia: Secondary | ICD-10-CM | POA: Insufficient documentation

## 2013-07-04 DIAGNOSIS — M5430 Sciatica, unspecified side: Secondary | ICD-10-CM | POA: Insufficient documentation

## 2013-07-04 NOTE — Assessment & Plan Note (Signed)
Prednisone 40 qd x 5d. Vicodin 5/325, 1-2 tabs po q6h prn pain.

## 2013-07-04 NOTE — Assessment & Plan Note (Addendum)
HbA1c just a tad bit up. Continue to work hard on diet, increase activity. No meds at this time.

## 2013-07-04 NOTE — Assessment & Plan Note (Signed)
Start atorvastatin 40 mg po qd.

## 2013-07-04 NOTE — Assessment & Plan Note (Signed)
Potassium consistently low-normal and occasionally mildly low on BMETs consistently. Start KDUR 20 mEQ, 2 tabs po qd. Recheck BMET 2 wks.

## 2013-07-06 ENCOUNTER — Ambulatory Visit: Payer: 59 | Admitting: Family Medicine

## 2013-07-06 ENCOUNTER — Telehealth: Payer: Self-pay | Admitting: Family Medicine

## 2013-07-06 NOTE — Telephone Encounter (Signed)
Patient called stating that he has taken his last prednisone today and he is still having pain.  Pt said after taking prednisone the pain was gone for about 5-6 hours but would return at night.  Please advise what he should do next?

## 2013-07-06 NOTE — Telephone Encounter (Signed)
I would still like to treat this conservatively for another 5-7d.  Sometimes these things just take a while to gradually heal. No further prednisone, but instead I recommend he take OTC alleve, 2 tabs twice daily with food.  Ask if he is out of the vicodin pain pills I rx'd.  If he is out then I'll rx some more, esp for night-time use. If not significantly improved in 5-7d then will have sports medicine/orthopedic specialist see him.-thx

## 2013-07-07 MED ORDER — HYDROCODONE-ACETAMINOPHEN 5-325 MG PO TABS: ORAL_TABLET | ORAL | Status: DC

## 2013-07-07 NOTE — Telephone Encounter (Signed)
Patient states that he will need another Rx for vicodin.  He will come by to pick up Rx tomorrow.  He will call to update on his pain in 5-7 days.

## 2013-07-07 NOTE — Telephone Encounter (Signed)
Vicodin rx printed. 

## 2013-07-15 ENCOUNTER — Other Ambulatory Visit (INDEPENDENT_AMBULATORY_CARE_PROVIDER_SITE_OTHER): Payer: 59

## 2013-07-15 DIAGNOSIS — Z0000 Encounter for general adult medical examination without abnormal findings: Secondary | ICD-10-CM

## 2013-07-17 LAB — NICOTINE/COTININE METABOLITES

## 2013-07-22 LAB — HM DIABETES EYE EXAM

## 2013-09-04 ENCOUNTER — Telehealth: Payer: Self-pay | Admitting: Family Medicine

## 2013-09-04 NOTE — Telephone Encounter (Signed)
Patient will CB to make appt. Diabetic bundle HTN & Lipid. Patient does have a note on 07/01/13 lab result to recheck BMET in 2 weeks.

## 2013-09-10 NOTE — Telephone Encounter (Signed)
Patient appt 09/11/13

## 2013-09-11 ENCOUNTER — Other Ambulatory Visit (INDEPENDENT_AMBULATORY_CARE_PROVIDER_SITE_OTHER): Payer: 59

## 2013-09-11 DIAGNOSIS — I10 Essential (primary) hypertension: Secondary | ICD-10-CM

## 2013-09-11 DIAGNOSIS — E785 Hyperlipidemia, unspecified: Secondary | ICD-10-CM

## 2013-09-11 LAB — BASIC METABOLIC PANEL
BUN: 29 mg/dL — ABNORMAL HIGH (ref 6–23)
CHLORIDE: 100 meq/L (ref 96–112)
CO2: 33 mEq/L — ABNORMAL HIGH (ref 19–32)
Calcium: 9.5 mg/dL (ref 8.4–10.5)
Creatinine, Ser: 1 mg/dL (ref 0.4–1.5)
GFR: 78.86 mL/min (ref >60.00)
Glucose, Bld: 108 mg/dL — ABNORMAL HIGH (ref 70–99)
Potassium: 3.2 mEq/L — ABNORMAL LOW (ref 3.5–5.1)
Sodium: 139 mEq/L (ref 135–145)

## 2013-09-11 LAB — LIPID PANEL
Cholesterol: 96 mg/dL (ref 0–200)
HDL: 33.4 mg/dL — AB (ref >39.00)
LDL CALC: 35 mg/dL (ref 0–99)
NonHDL: 62.6
TRIGLYCERIDES: 136 mg/dL (ref 0.0–149.0)
Total CHOL/HDL Ratio: 3
VLDL: 27.2 mg/dL (ref 0.0–40.0)

## 2013-10-12 ENCOUNTER — Other Ambulatory Visit (INDEPENDENT_AMBULATORY_CARE_PROVIDER_SITE_OTHER): Payer: 59

## 2013-10-12 DIAGNOSIS — E876 Hypokalemia: Secondary | ICD-10-CM

## 2013-10-12 LAB — BASIC METABOLIC PANEL
BUN: 23 mg/dL (ref 6–23)
CO2: 27 mEq/L (ref 19–32)
Calcium: 9.3 mg/dL (ref 8.4–10.5)
Chloride: 102 mEq/L (ref 96–112)
Creatinine, Ser: 1.1 mg/dL (ref 0.4–1.5)
GFR: 68.56 mL/min (ref >60.00)
Glucose, Bld: 120 mg/dL — ABNORMAL HIGH (ref 70–99)
Potassium: 3.9 mEq/L (ref 3.5–5.1)
Sodium: 137 mEq/L (ref 135–145)

## 2013-10-14 ENCOUNTER — Other Ambulatory Visit: Payer: Self-pay | Admitting: Family Medicine

## 2013-10-14 MED ORDER — POTASSIUM CHLORIDE CRYS ER 20 MEQ PO TBCR: 40.0000 meq | EXTENDED_RELEASE_TABLET | Freq: Two times a day (BID) | ORAL | Status: DC

## 2013-10-15 ENCOUNTER — Other Ambulatory Visit: Payer: Self-pay | Admitting: Family Medicine

## 2013-11-10 ENCOUNTER — Telehealth: Payer: Self-pay

## 2013-11-10 NOTE — Telephone Encounter (Signed)
LVM for pt to call back and schedule nurse visit for bp recheck.

## 2013-12-17 ENCOUNTER — Telehealth: Payer: Self-pay | Admitting: *Deleted

## 2013-12-17 NOTE — Telephone Encounter (Signed)
Patient left vm requesting medication for sinus infection to be sent to [facility] on [address]. Patient stated that he has facial pressure, stuffy nose with drainage,and green mucous. Patient stated that he out of town today and is flying out of town tomorrow at 5:00 am.  Please advise?

## 2013-12-17 NOTE — Telephone Encounter (Signed)
He should use neilmed sinus rinse daily & coricidin nasal decongestant.

## 2013-12-17 NOTE — Telephone Encounter (Signed)
Left detailed message on pt's cell vm. Per dpr.

## 2013-12-19 ENCOUNTER — Other Ambulatory Visit: Payer: Self-pay | Admitting: Family Medicine

## 2013-12-20 NOTE — Telephone Encounter (Signed)
Agree/noted. 

## 2013-12-25 ENCOUNTER — Encounter: Payer: Self-pay | Admitting: Family Medicine

## 2013-12-25 ENCOUNTER — Ambulatory Visit (INDEPENDENT_AMBULATORY_CARE_PROVIDER_SITE_OTHER): Payer: 59 | Admitting: Family Medicine

## 2013-12-25 VITALS — BP 143/82 | HR 73 | Temp 99.0°F | Resp 18 | Ht 65.5 in | Wt 219.0 lb

## 2013-12-25 DIAGNOSIS — J209 Acute bronchitis, unspecified: Secondary | ICD-10-CM

## 2013-12-25 DIAGNOSIS — J018 Other acute sinusitis: Secondary | ICD-10-CM

## 2013-12-25 DIAGNOSIS — Z23 Encounter for immunization: Secondary | ICD-10-CM

## 2013-12-25 MED ORDER — AMOXICILLIN 500 MG PO CAPS: ORAL_CAPSULE | ORAL | Status: DC

## 2013-12-25 NOTE — Progress Notes (Signed)
Pre visit review using our clinic review tool, if applicable. No additional management support is needed unless otherwise documented below in the visit note. 

## 2013-12-25 NOTE — Progress Notes (Signed)
OFFICE NOTE  12/25/2013  CC:  Chief Complaint  Patient presents with  . Nasal Congestion    over a week   . Cough     HPI: Patient is a 63 y.o. Caucasian male who is here for nasal and sinus congestion/mucous, with PND coughing. Pressure over frontal sinus regions diffusely.  No facial pain.  Mild fatigue/malaise.  No fevers.   Felt some chest congestion yesterday but denies wheezing or SOB or chest tightness.  Pertinent PMH:  Past medical, surgical, social, and family history reviewed and no changes are noted since last office visit.  MEDS:  Outpatient Prescriptions Prior to Visit  Medication Sig Dispense Refill  . aspirin 81 MG tablet Take 81 mg by mouth daily.        Marland Kitchen atorvastatin (LIPITOR) 40 MG tablet TAKE 1 TABLET (40 MG TOTAL) BY MOUTH DAILY.  30 tablet  5  . diclofenac (VOLTAREN) 75 MG EC tablet TAKE 1 TABLET (75 MG TOTAL) BY MOUTH 2 (TWO) TIMES DAILY AS NEEDED  60 tablet  6  . glucose blood (FREESTYLE LITE) test strip Use as instructed  32 each  12  . hydrochlorothiazide (HYDRODIURIL) 25 MG tablet TAKE 1 TABLET (25 MG TOTAL) BY MOUTH DAILY.  30 tablet  10  . potassium chloride SA (K-DUR,KLOR-CON) 20 MEQ tablet Take 2 tablets (40 mEq total) by mouth 2 (two) times daily.  120 tablet  3  . HYDROcodone-acetaminophen (NORCO/VICODIN) 5-325 MG per tablet 1-2 tabs po q6h prn pain  40 tablet  0  . predniSONE (DELTASONE) 20 MG tablet 2 tabs po qd x 5d  10 tablet  0   No facility-administered medications prior to visit.    PE: Blood pressure 143/82, pulse 73, temperature 99 F (37.2 C), temperature source Temporal, resp. rate 18, height 5' 5.5" (1.664 m), weight 219 lb (99.338 kg), SpO2 98.00%. VS: noted--normal. Gen: alert, NAD, NONTOXIC APPEARING. HEENT: eyes without injection, drainage, or swelling.  Ears: EACs clear, TMs with normal light reflex and landmarks.  Nose: Clear rhinorrhea, with some dried, crusty exudate adherent to mildly injected mucosa.  No purulent d/c.   Mild diffuse paranasal sinus TTP.  No facial swelling.  Throat and mouth without focal lesion.  No pharyngial swelling, erythema, or exudate.   Neck: supple, no LAD.   LUNGS: CTA bilat, nonlabored resps.  No wheeze, no cough with forced exhalation maneuver. CV: RRR, no m/r/g. EXT: no c/c/e SKIN: no rash   IMPRESSION AND PLAN:  Acute sinusitis, mild acute bronchitis. Continue symptomatic care and add amoxil 500 mg bid x 10d.  An After Visit Summary was printed and given to the patient. Flu vaccine IM today.  FOLLOW UP: prn

## 2014-01-04 ENCOUNTER — Telehealth: Payer: Self-pay | Admitting: Family Medicine

## 2014-01-04 MED ORDER — LEVOFLOXACIN 500 MG PO TABS: 500.0000 mg | ORAL_TABLET | Freq: Every day | ORAL | Status: DC

## 2014-01-04 NOTE — Telephone Encounter (Signed)
I must have confused him.  I meant for him to take it as written on the prescription (twice a day). Lets change plans since he is no better: stop amoxil. Pls eRx levaquin 500 mg, 1 tab po qd x 10d, #10, no RF.  He can start this today even if he took one dose of amoxil already.-thx

## 2014-01-04 NOTE — Telephone Encounter (Signed)
Patient Information:  Caller Name: Ashir  Phone: [phone]  Patient: Michael Hodge, Michael Hodge  Gender: Male  DOB: 06/16/1950  Age: 63 Years  PCP: Ricardo Jericho [facility])  Office Follow Up:  Does the office need to follow up with this patient?: Yes  Instructions For The Office: Can you ask Dr. Genelle Gather if he intended for patient to take only 1 tab of Amoxicillin 500 mgs daily. He is not better and insists that is the instructions he received from the doctor. He may not need appointment but may only need med adjustment/new RX called in.    Symptoms  Reason For Call & Symptoms: Seen in the office on 12/25/13 and prescribed Amoxicillin 500 mgs 1 PO QD x 10 days for sinus Infection and he is not feeling any better. He is still congested and has cough, headaches and pressure in face. Still having yellow drainage in nose and throat. Recently travelled in plane and felt fluid in ears but better today. Prescription reads to take Amoxicillin BID but he remembers Dr. Genelle Gather telling him to only take 1 PO /day.    Reviewed Health History In EMR: Yes  Reviewed Medications In EMR: Yes  Reviewed Allergies In EMR: Yes  Reviewed Surgeries / Procedures: Yes  Date of Onset of Symptoms: 12/17/2013  Guideline(s) Used:  Sinus Pain and Congestion  Disposition Per Guideline:   See Today or Tomorrow in Office  Reason For Disposition Reached:   Sinus congestion (pressure, fullness) present > 10 days  Advice Given:  For a Stuffy Nose - Use Nasal Washes:  Introduction: Saline (salt water) nasal irrigation (nasal wash) is an effective and simple home remedy for treating stuffy nose and sinus congestion. The nose can be irrigated by pouring, spraying, or squirting salt water into the nose and then letting it run back out.  How it Helps: The salt water rinses out excess mucus, washes out any irritants (dust, allergens) that might be present, and moistens the nasal cavity.  Methods: There are several ways  to perform nasal irrigation. You can use a saline nasal spray bottle (available over-the-counter), a rubber ear syringe, a medical syringe without the needle, or a Neti Pot.  Caution - Nasal Decongestants:  Do not take these medications if you have high blood pressure, heart disease, prostate problems, or an overactive thyroid.  Do not take these medications if you have used an MAO inhibitor such as isocarboxazid (Marplan), phenelzine (Nardil), rasagiline (Azilect), selegiline (Eldepryl, Emsam), or tranylcypromine (Parnate) in the past 2 weeks. Life-threatening side effects can occur.  Do not use these medications for more than 3 days (Reason: rebound nasal congestion).  Pain and Fever Medicines:  Acetaminophen (e.g., Tylenol):  Regular Strength Tylenol: Take 650 mg (two 325 mg pills) by mouth every 4-6 hours as needed. Each Regular Strength Tylenol pill has 325 mg of acetaminophen.  Extra Strength Tylenol: Take 1,000 mg (two 500 mg pills) every 8 hours as needed. Each Extra Strength Tylenol pill has 500 mg of acetaminophen.  The most you should take each day is 3,000 mg (10 Regular Strength or 6 Extra Strength pills a day).  Hydration:  Drink plenty of liquids (6-8 glasses of water daily). If the air in your home is dry, use a cool mist humidifier  Expected Course:  Sinus congestion from viral upper respiratory infections (colds) usually lasts 5-10 days.  Occasionally a cold can worsen and turn into bacterial sinusitis. Clues to this are sinus symptoms lasting longer than 10  days, fever lasting longer than 3 days, and worsening pain. Bacterial sinusitis may need antibiotic treatment.  Call Back If:   Severe pain lasts longer than 2 hours after pain medicine  Sinus pain lasts longer than 1 day after starting treatment using nasal washes  Sinus congestion (fullness) lasts longer than 10 days  Fever lasts longer than 3 days  You become worse.  Patient Will Follow Care Advice:  YES  Appointment  Scheduled:  01/05/2014 11:15:00 Appointment Scheduled Provider:  Ricardo Jericho [facility])

## 2014-01-04 NOTE — Telephone Encounter (Signed)
Patient aware.   He is good with new plan.  Patient will p/u levaquin and start RX today.  Rx sent to [facility].

## 2014-01-04 NOTE — Telephone Encounter (Signed)
Please advise 

## 2014-01-05 ENCOUNTER — Ambulatory Visit: Payer: Self-pay | Admitting: Family Medicine

## 2014-01-19 ENCOUNTER — Other Ambulatory Visit: Payer: Self-pay | Admitting: Family Medicine

## 2014-02-10 ENCOUNTER — Other Ambulatory Visit: Payer: Self-pay | Admitting: Family Medicine

## 2014-02-10 NOTE — Telephone Encounter (Signed)
Pt LMOM requesting rf of voltaren.   I spoke to pharmacy and they have rfs available.  Pt aware.

## 2014-02-15 ENCOUNTER — Other Ambulatory Visit: Payer: Self-pay

## 2014-02-15 MED ORDER — POTASSIUM CHLORIDE ER 20 MEQ PO TBCR: EXTENDED_RELEASE_TABLET | ORAL | Status: DC

## 2014-02-22 ENCOUNTER — Other Ambulatory Visit: Payer: Self-pay | Admitting: Family Medicine

## 2014-02-22 ENCOUNTER — Telehealth: Payer: Self-pay | Admitting: Family Medicine

## 2014-02-22 DIAGNOSIS — Z125 Encounter for screening for malignant neoplasm of prostate: Secondary | ICD-10-CM

## 2014-02-22 DIAGNOSIS — I10 Essential (primary) hypertension: Secondary | ICD-10-CM

## 2014-02-22 DIAGNOSIS — E119 Type 2 diabetes mellitus without complications: Secondary | ICD-10-CM

## 2014-02-22 DIAGNOSIS — E785 Hyperlipidemia, unspecified: Secondary | ICD-10-CM

## 2014-02-22 NOTE — Telephone Encounter (Signed)
Michael Hodge has an appt for his CPE on Jan 15. Please put in orders for him to get fasting labs at [facility] prior to that date.

## 2014-02-22 NOTE — Telephone Encounter (Signed)
Done

## 2014-03-10 ENCOUNTER — Other Ambulatory Visit (INDEPENDENT_AMBULATORY_CARE_PROVIDER_SITE_OTHER): Payer: 59

## 2014-03-10 DIAGNOSIS — Z789 Other specified health status: Secondary | ICD-10-CM

## 2014-03-10 DIAGNOSIS — Z125 Encounter for screening for malignant neoplasm of prostate: Secondary | ICD-10-CM

## 2014-03-10 DIAGNOSIS — Z0000 Encounter for general adult medical examination without abnormal findings: Secondary | ICD-10-CM

## 2014-03-10 LAB — LIPID PANEL
Cholesterol: 93 mg/dL (ref 0–200)
HDL: 32.3 mg/dL — ABNORMAL LOW (ref >39.00)
LDL CALC: 36 mg/dL (ref 0–99)
NonHDL: 60.7
Total CHOL/HDL Ratio: 3
Triglycerides: 123 mg/dL (ref 0.0–149.0)
VLDL: 24.6 mg/dL (ref 0.0–40.0)

## 2014-03-10 LAB — CBC WITH DIFFERENTIAL/PLATELET
BASOS ABS: 0 10*3/uL (ref 0.0–0.1)
Basophils Relative: 0.7 % (ref 0.0–3.0)
EOS ABS: 0.1 10*3/uL (ref 0.0–0.7)
Eosinophils Relative: 2.1 % (ref 0.0–5.0)
HCT: 44 % (ref 39.0–52.0)
Hemoglobin: 14.7 g/dL (ref 13.0–17.0)
Lymphocytes Relative: 30.3 % (ref 12.0–46.0)
Lymphs Abs: 2.2 10*3/uL (ref 0.7–4.0)
MCHC: 33.4 g/dL (ref 30.0–36.0)
MCV: 93.2 fl (ref 78.0–100.0)
Monocytes Absolute: 0.8 10*3/uL (ref 0.1–1.0)
Monocytes Relative: 11.9 % (ref 3.0–12.0)
NEUTROS PCT: 55 % (ref 43.0–77.0)
Neutro Abs: 3.9 10*3/uL (ref 1.4–7.7)
Platelets: 217 10*3/uL (ref 150.0–400.0)
RBC: 4.72 Mil/uL (ref 4.22–5.81)
RDW: 14 % (ref 11.5–15.5)
WBC: 7.1 10*3/uL (ref 4.0–10.5)

## 2014-03-10 LAB — COMPREHENSIVE METABOLIC PANEL
ALK PHOS: 52 U/L (ref 39–117)
ALT: 38 U/L (ref 0–53)
AST: 28 U/L (ref 0–37)
Albumin: 4.3 g/dL (ref 3.5–5.2)
BILIRUBIN TOTAL: 1.1 mg/dL (ref 0.2–1.2)
BUN: 30 mg/dL — ABNORMAL HIGH (ref 6–23)
CO2: 32 mEq/L (ref 19–32)
CREATININE: 1.08 mg/dL (ref 0.40–1.50)
Calcium: 9.8 mg/dL (ref 8.4–10.5)
Chloride: 101 mEq/L (ref 96–112)
GFR: 72.88 mL/min (ref >60.00)
Glucose, Bld: 132 mg/dL — ABNORMAL HIGH (ref 70–99)
Potassium: 3.8 mEq/L (ref 3.5–5.1)
SODIUM: 138 meq/L (ref 135–145)
TOTAL PROTEIN: 7.1 g/dL (ref 6.0–8.3)

## 2014-03-10 LAB — PSA: PSA: 0.77 ng/mL (ref 0.10–4.00)

## 2014-03-10 LAB — TSH: TSH: 3.69 u[IU]/mL (ref 0.35–4.50)

## 2014-03-12 ENCOUNTER — Other Ambulatory Visit (INDEPENDENT_AMBULATORY_CARE_PROVIDER_SITE_OTHER): Payer: 59

## 2014-03-12 ENCOUNTER — Ambulatory Visit (INDEPENDENT_AMBULATORY_CARE_PROVIDER_SITE_OTHER): Payer: 59 | Admitting: Family Medicine

## 2014-03-12 ENCOUNTER — Encounter: Payer: Self-pay | Admitting: Family Medicine

## 2014-03-12 VITALS — BP 145/79 | HR 52 | Temp 97.4°F | Ht 65.5 in | Wt 216.0 lb

## 2014-03-12 DIAGNOSIS — E785 Hyperlipidemia, unspecified: Secondary | ICD-10-CM

## 2014-03-12 DIAGNOSIS — E119 Type 2 diabetes mellitus without complications: Secondary | ICD-10-CM

## 2014-03-12 DIAGNOSIS — I10 Essential (primary) hypertension: Secondary | ICD-10-CM

## 2014-03-12 DIAGNOSIS — Z0000 Encounter for general adult medical examination without abnormal findings: Secondary | ICD-10-CM

## 2014-03-12 DIAGNOSIS — Z125 Encounter for screening for malignant neoplasm of prostate: Secondary | ICD-10-CM

## 2014-03-12 LAB — MICROALBUMIN / CREATININE URINE RATIO
Creatinine,U: 135.9 mg/dL
MICROALB UR: 0.6 mg/dL (ref 0.0–1.9)
MICROALB/CREAT RATIO: 0.4 mg/g (ref 0.0–30.0)

## 2014-03-12 LAB — HEMOGLOBIN A1C: Hgb A1c MFr Bld: 7.4 % — ABNORMAL HIGH (ref 4.6–6.5)

## 2014-03-12 LAB — NICOTINE/COTININE METABOLITES: Cotinine: <10 ng/mL

## 2014-03-12 NOTE — Progress Notes (Signed)
Office Note 03/20/2014  CC:  Chief Complaint  Patient presents with  . Annual Exam    HPI:  Michael Hodge is a 64 y.o. White male who is here for CPE. Reviewed fasting labs in detail with pt today.  I forgot to get A1c with these labs so I'm attempting to add this lab on. He complains of some constant mild soreness in right great toe, almost like it is in-grown.  No swelling or redness noted.  Denies tingling, burning, or loss of sensation in feet/toes.  Glucoses 110s-140s fasting, 2H PP usually 140-160.  Past Medical History  Diagnosis Date  . Allergy   . Arthritis   . Hypertension   . CAD (coronary artery disease)     CT demonstrated nonobstructive plaques (this test was prompted by borderline ETT.  Marland Kitchen Dyslipidemia   . Obesity   . DM type 2 (diabetes mellitus, type 2) dx'd 08/2011    Fasting gluc 136 and HbA1c 6.7% at diagnosis.   Marland Kitchen Hx of adenomatous colonic polyps   . Chronic renal insufficiency, stage III (moderate)     Borderline stage II/III, CrCl @ 60 ml/min    Past Surgical History  Procedure Laterality Date  . Remvoal sebaceous cyst  15-20 years ago  . Tonsillectomy    . Colonoscopy w/ polypectomy  2005;08/21/10    adenomatous; also mild diverticulosis; recall 2017.  iFOB neg 09/2011.  . Cardiovascular stress test  02/2013    LexiScan/low-level exercise-Myoview (02/2013): No ischemia, EF 53%, occ symptomatic PVCs.    Family History  Problem Relation Age of Onset  . Stroke Other   . Diabetes Father   . Stroke Father   . Heart attack Mother     History   Social History  . Marital Status: Married    Spouse Name: N/A    Number of Children: N/A  . Years of Education: N/A   Occupational History  . Not on file.   Social History Main Topics  . Smoking status: Never Smoker   . Smokeless tobacco: Never Used  . Alcohol Use: 1.8 oz/week    3 Shots of liquor per week     Comment: occ  . Drug Use: No  . Sexual Activity: Yes   Other Topics Concern  . Not  on file   Social History Narrative   Married x 40 yrs.   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   No T/A/Ds.    Outpatient Prescriptions Prior to Visit  Medication Sig Dispense Refill  . aspirin 81 MG tablet Take 81 mg by mouth daily.      Marland Kitchen atorvastatin (LIPITOR) 40 MG tablet TAKE 1 TABLET (40 MG TOTAL) BY MOUTH DAILY. 30 tablet 5  . diclofenac (VOLTAREN) 75 MG EC tablet TAKE 1 TABLET (75 MG TOTAL) BY MOUTH 2 (TWO) TIMES DAILY AS NEEDED 60 tablet 6  . glucose blood (FREESTYLE LITE) test strip Use as instructed 32 each 12  . hydrochlorothiazide (HYDRODIURIL) 25 MG tablet TAKE 1 TABLET (25 MG TOTAL) BY MOUTH DAILY. 30 tablet 10  . Potassium Chloride ER 20 MEQ TBCR TAKE 2 TABLETS (40 MEQ TOTAL) BY MOUTH 2 (TWO) TIMES DAILY. 120 tablet 1  . amoxicillin (AMOXIL) 500 MG capsule 1 tab po bid x 10d (Patient not taking: Reported on 03/12/2014) 20 capsule 0  . levofloxacin (LEVAQUIN) 500 MG tablet Take 1 tablet (500 mg total) by mouth daily. (Patient not taking: Reported on 03/12/2014) 10 tablet 0  No facility-administered medications prior to visit.    No Known Allergies  ROS Review of Systems  Constitutional: Negative for fever, chills, appetite change and fatigue.  HENT: Negative for congestion, dental problem, ear pain and sore throat.   Eyes: Negative for discharge, redness and visual disturbance.  Respiratory: Negative for cough, chest tightness, shortness of breath and wheezing.   Cardiovascular: Negative for chest pain, palpitations and leg swelling.  Gastrointestinal: Negative for nausea, vomiting, abdominal pain, diarrhea and blood in stool.  Genitourinary: Negative for dysuria, urgency, frequency, hematuria, flank pain and difficulty urinating.  Musculoskeletal: Negative for myalgias, back pain, joint swelling, arthralgias and neck stiffness.  Skin: Negative for pallor and rash.  Neurological: Negative for dizziness, speech difficulty, weakness and  headaches.  Hematological: Negative for adenopathy. Does not bruise/bleed easily.  Psychiatric/Behavioral: Negative for confusion and sleep disturbance. The patient is not nervous/anxious.     PE; Blood pressure 145/79, pulse 52, temperature 97.4 F (36.3 C), temperature source Temporal, height 5' 5.5" (1.664 m), weight 216 lb (97.977 kg), SpO2 100 %. His BMI is 35 currently. Gen: Alert, well appearing.  Patient is oriented to person, place, time, and situation. AFFECT: pleasant, lucid thought and speech. ENT: Ears: EACs clear, normal epithelium.  TMs with good light reflex and landmarks bilaterally.  Eyes: no injection, icteris, swelling, or exudate.  EOMI, PERRLA. Nose: no drainage or turbinate edema/swelling.  No injection or focal lesion.  Mouth: lips without lesion/swelling.  Oral mucosa pink and moist.  Dentition intact and without obvious caries or gingival swelling.  Oropharynx without erythema, exudate, or swelling.  Neck: supple/nontender.  No LAD, mass, or TM.  Carotid pulses 2+ bilaterally, without bruits. CV: RRR, no m/r/g.   LUNGS: CTA bilat, nonlabored resps, good aeration in all lung fields. ABD: soft, NT, ND, BS normal.  No hepatospenomegaly or mass.  No bruits. EXT: no clubbing, cyanosis, or edema.  Musculoskeletal: no joint swelling, erythema, warmth, or tenderness.  ROM of all joints intact. Skin - no sores or suspicious lesions or rashes or color changes Foot exam - bilateral normal; no swelling, tenderness or skin or vascular lesions. Color and temperature is normal. Sensation is intact. Peripheral pulses are palpable. Toenails are normal.  Skin around toenails is normal: no swelling, erythema, or tenderness.  No sign of ingrown toenail on any toes. Rectal exam: negative without mass, lesions or tenderness.  Prostate without nodules, enlargement, or tenderness.  Pertinent labs:  Lab Results  Component Value Date   TSH 3.69 03/10/2014   Lab Results  Component Value  Date   WBC 7.1 03/10/2014   HGB 14.7 03/10/2014   HCT 44.0 03/10/2014   MCV 93.2 03/10/2014   PLT 217.0 03/10/2014   Lab Results  Component Value Date   CREATININE 1.08 03/10/2014   BUN 30* 03/10/2014   NA 138 03/10/2014   K 3.8 03/10/2014   CL 101 03/10/2014   CO2 32 03/10/2014   Lab Results  Component Value Date   ALT 38 03/10/2014   AST 28 03/10/2014   ALKPHOS 52 03/10/2014   BILITOT 1.1 03/10/2014   Lab Results  Component Value Date   CHOL 93 03/10/2014   Lab Results  Component Value Date   HDL 32.30* 03/10/2014   Lab Results  Component Value Date   LDLCALC 36 03/10/2014   Lab Results  Component Value Date   TRIG 123.0 03/10/2014   Lab Results  Component Value Date   CHOLHDL 3 03/10/2014   Lab Results  Component Value Date   PSA 0.77 03/10/2014   PSA 0.53 01/06/2013   PSA 0.44 09/17/2011   Lab Results  Component Value Date   HGBA1C 7.4* 03/12/2014    ASSESSMENT AND PLAN:   Health maintenance examination Reviewed age and gender appropriate health maintenance issues (prudent diet, regular exercise, health risks of tobacco and excessive alcohol, use of seatbelts, fire alarms in home, use of sunscreen).  Also reviewed age and gender appropriate health screening as well as vaccine recommendations. HP labs reviewed: all ok. Vaccines all UTD.    Diabetes mellitus without complication Control decent per home measurements. Check HbA1c today. Send urine microalb/cr today. Feet exam normal today.   An After Visit Summary was printed and given to the patient.  FOLLOW UP:  Return in about 6 months (around 09/10/2014) for routine chronic illness f/u .

## 2014-03-12 NOTE — Progress Notes (Signed)
Pre visit review using our clinic review tool, if applicable. No additional management support is needed unless otherwise documented below in the visit note. 

## 2014-03-15 ENCOUNTER — Encounter: Payer: Self-pay | Admitting: *Deleted

## 2014-03-15 ENCOUNTER — Telehealth: Payer: Self-pay | Admitting: *Deleted

## 2014-03-15 NOTE — Telephone Encounter (Signed)
-----   Message from Tammi Sou, MD sent at 03/14/2014 11:30 AM EST ----- Pls notify: urine protein test normal--we'll repeat this in 1 yr. His HbA1c was 7.4%, his highest in the last 2 yrs.  I recommend either getting MUCH tighter on diabetic diet and exercise OR start metformin 500 mg twice a day.  If pt ok with starting med, which is what I would recommend if he wants to know, pls eRx metformin 500 mg tabs, 1 tab po bid with meals, # 60, RF x 6.    All other labs were reviewed with him when he was here for his o/v EXCEPT his urine nicotine screen (cotinine) and now this result is back and it is negative as expected.  Pls fill in the blanks in his insurance/employer health form on your desk and I'll sign it. -thx

## 2014-03-16 ENCOUNTER — Other Ambulatory Visit: Payer: Self-pay | Admitting: Family Medicine

## 2014-03-20 ENCOUNTER — Encounter: Payer: Self-pay | Admitting: Family Medicine

## 2014-03-20 DIAGNOSIS — E119 Type 2 diabetes mellitus without complications: Secondary | ICD-10-CM | POA: Insufficient documentation

## 2014-03-20 NOTE — Assessment & Plan Note (Signed)
Reviewed age and gender appropriate health maintenance issues (prudent diet, regular exercise, health risks of tobacco and excessive alcohol, use of seatbelts, fire alarms in home, use of sunscreen).  Also reviewed age and gender appropriate health screening as well as vaccine recommendations. HP labs reviewed: all ok. Vaccines all UTD.

## 2014-03-20 NOTE — Assessment & Plan Note (Signed)
Control decent per home measurements. Check HbA1c today. Send urine microalb/cr today. Feet exam normal today.

## 2014-03-31 ENCOUNTER — Telehealth: Payer: Self-pay | Admitting: Family Medicine

## 2014-03-31 NOTE — Telephone Encounter (Signed)
Please contact patient, he has questions about his lab results. Patient is requesting Dr. Anitra Lauth to call him if possible.

## 2014-03-31 NOTE — Telephone Encounter (Signed)
Please advise 

## 2014-03-31 NOTE — Telephone Encounter (Signed)
Called pt, he was inquiring about the possibility of doing stricter TLC for the next 87mo and then rechecking HbA1c INSTEAD of doing metformin at this time.  If A1c same or rising at next check then he is ok with starting metformin.

## 2014-04-13 ENCOUNTER — Other Ambulatory Visit: Payer: Self-pay | Admitting: Family Medicine

## 2014-04-15 ENCOUNTER — Other Ambulatory Visit: Payer: Self-pay | Admitting: Family Medicine

## 2014-06-27 ENCOUNTER — Other Ambulatory Visit: Payer: Self-pay | Admitting: Family Medicine

## 2014-06-29 ENCOUNTER — Encounter: Payer: Self-pay | Admitting: Gastroenterology

## 2014-07-07 ENCOUNTER — Other Ambulatory Visit: Payer: Self-pay | Admitting: Family Medicine

## 2014-08-12 ENCOUNTER — Telehealth: Payer: Self-pay | Admitting: Family Medicine

## 2014-08-12 DIAGNOSIS — E119 Type 2 diabetes mellitus without complications: Secondary | ICD-10-CM

## 2014-08-12 DIAGNOSIS — E876 Hypokalemia: Secondary | ICD-10-CM

## 2014-08-12 NOTE — Telephone Encounter (Signed)
Please advise. Thanks.  

## 2014-08-12 NOTE — Telephone Encounter (Signed)
Pt advised and voiced understanding.   

## 2014-08-12 NOTE — Telephone Encounter (Signed)
Orders entered   thx

## 2014-08-12 NOTE — Telephone Encounter (Signed)
Pt called and is requesting orders be put in for him to have his A1C and Potassium levels checked. He saw Dr. Anitra Lauth 4 mns ago and was told to get these things checked. If his potassium is good he would like to reduce or d/c his potassium meds.

## 2014-08-13 ENCOUNTER — Other Ambulatory Visit (INDEPENDENT_AMBULATORY_CARE_PROVIDER_SITE_OTHER): Payer: 59

## 2014-08-13 DIAGNOSIS — E876 Hypokalemia: Secondary | ICD-10-CM | POA: Diagnosis not present

## 2014-08-13 DIAGNOSIS — E119 Type 2 diabetes mellitus without complications: Secondary | ICD-10-CM | POA: Diagnosis not present

## 2014-08-13 LAB — BASIC METABOLIC PANEL
BUN: 27 mg/dL — AB (ref 6–23)
CO2: 30 mEq/L (ref 19–32)
CREATININE: 1.03 mg/dL (ref 0.40–1.50)
Calcium: 9.7 mg/dL (ref 8.4–10.5)
Chloride: 102 mEq/L (ref 96–112)
GFR: 76.88 mL/min (ref >60.00)
Glucose, Bld: 125 mg/dL — ABNORMAL HIGH (ref 70–99)
POTASSIUM: 3.6 meq/L (ref 3.5–5.1)
Sodium: 138 mEq/L (ref 135–145)

## 2014-08-13 LAB — HEMOGLOBIN A1C: Hgb A1c MFr Bld: 6.5 % (ref 4.6–6.5)

## 2014-10-15 LAB — HM DIABETES EYE EXAM

## 2014-11-04 ENCOUNTER — Encounter: Payer: Self-pay | Admitting: Family Medicine

## 2014-11-26 ENCOUNTER — Ambulatory Visit: Payer: 59 | Admitting: Family Medicine

## 2014-12-08 ENCOUNTER — Other Ambulatory Visit: Payer: Self-pay | Admitting: Family Medicine

## 2014-12-09 NOTE — Telephone Encounter (Signed)
LOV: 03/12/14 NOV: 12/24/14  RF request for atorvastatin Last written: 07/07/14 #30 w/ 4RF  RF request for hctz Last written: 12/21/13 #30 w/ 10RF

## 2014-12-24 ENCOUNTER — Encounter: Payer: Self-pay | Admitting: Family Medicine

## 2014-12-24 ENCOUNTER — Ambulatory Visit (INDEPENDENT_AMBULATORY_CARE_PROVIDER_SITE_OTHER): Payer: 59 | Admitting: Family Medicine

## 2014-12-24 VITALS — BP 163/94 | HR 46 | Temp 97.7°F | Resp 16 | Ht 65.75 in | Wt 217.0 lb

## 2014-12-24 DIAGNOSIS — I10 Essential (primary) hypertension: Secondary | ICD-10-CM

## 2014-12-24 DIAGNOSIS — E785 Hyperlipidemia, unspecified: Secondary | ICD-10-CM

## 2014-12-24 DIAGNOSIS — Z8639 Personal history of other endocrine, nutritional and metabolic disease: Secondary | ICD-10-CM | POA: Diagnosis not present

## 2014-12-24 DIAGNOSIS — Z0000 Encounter for general adult medical examination without abnormal findings: Secondary | ICD-10-CM | POA: Diagnosis not present

## 2014-12-24 DIAGNOSIS — E119 Type 2 diabetes mellitus without complications: Secondary | ICD-10-CM | POA: Diagnosis not present

## 2014-12-24 DIAGNOSIS — Z125 Encounter for screening for malignant neoplasm of prostate: Secondary | ICD-10-CM

## 2014-12-24 LAB — CBC WITH DIFFERENTIAL/PLATELET
BASOS PCT: 0.5 % (ref 0.0–3.0)
Basophils Absolute: 0 10*3/uL (ref 0.0–0.1)
EOS ABS: 0.2 10*3/uL (ref 0.0–0.7)
Eosinophils Relative: 2.2 % (ref 0.0–5.0)
HCT: 44.8 % (ref 39.0–52.0)
HEMOGLOBIN: 15 g/dL (ref 13.0–17.0)
Lymphocytes Relative: 25.9 % (ref 12.0–46.0)
Lymphs Abs: 1.9 10*3/uL (ref 0.7–4.0)
MCHC: 33.5 g/dL (ref 30.0–36.0)
MCV: 92.8 fl (ref 78.0–100.0)
MONO ABS: 0.7 10*3/uL (ref 0.1–1.0)
Monocytes Relative: 10.3 % (ref 3.0–12.0)
NEUTROS ABS: 4.4 10*3/uL (ref 1.4–7.7)
NEUTROS PCT: 61.1 % (ref 43.0–77.0)
PLATELETS: 215 10*3/uL (ref 150.0–400.0)
RBC: 4.83 Mil/uL (ref 4.22–5.81)
RDW: 13.6 % (ref 11.5–15.5)
WBC: 7.2 10*3/uL (ref 4.0–10.5)

## 2014-12-24 LAB — LIPID PANEL
CHOLESTEROL: 101 mg/dL (ref 0–200)
HDL: 36.1 mg/dL — ABNORMAL LOW (ref >39.00)
LDL CALC: 40 mg/dL (ref 0–99)
NonHDL: 65.36
Total CHOL/HDL Ratio: 3
Triglycerides: 126 mg/dL (ref 0.0–149.0)
VLDL: 25.2 mg/dL (ref 0.0–40.0)

## 2014-12-24 LAB — COMPREHENSIVE METABOLIC PANEL
ALBUMIN: 4.4 g/dL (ref 3.5–5.2)
ALT: 51 U/L (ref 0–53)
AST: 40 U/L — AB (ref 0–37)
Alkaline Phosphatase: 57 U/L (ref 39–117)
BILIRUBIN TOTAL: 0.7 mg/dL (ref 0.2–1.2)
BUN: 28 mg/dL — AB (ref 6–23)
CHLORIDE: 101 meq/L (ref 96–112)
CO2: 28 mEq/L (ref 19–32)
CREATININE: 1.03 mg/dL (ref 0.40–1.50)
Calcium: 9.8 mg/dL (ref 8.4–10.5)
GFR: 76.79 mL/min (ref >60.00)
Glucose, Bld: 116 mg/dL — ABNORMAL HIGH (ref 70–99)
Potassium: 3.8 mEq/L (ref 3.5–5.1)
SODIUM: 139 meq/L (ref 135–145)
Total Protein: 7.2 g/dL (ref 6.0–8.3)

## 2014-12-24 MED ORDER — ATORVASTATIN CALCIUM 40 MG PO TABS: ORAL_TABLET | ORAL | Status: DC

## 2014-12-24 MED ORDER — DICLOFENAC SODIUM 75 MG PO TBEC: DELAYED_RELEASE_TABLET | ORAL | Status: DC

## 2014-12-24 MED ORDER — POTASSIUM CHLORIDE ER 20 MEQ PO TBCR: EXTENDED_RELEASE_TABLET | ORAL | Status: DC

## 2014-12-24 MED ORDER — HYDROCHLOROTHIAZIDE 25 MG PO TABS: ORAL_TABLET | ORAL | Status: DC

## 2014-12-24 NOTE — Progress Notes (Signed)
Pre visit review using our clinic review tool, if applicable. No additional management support is needed unless otherwise documented below in the visit note. 

## 2014-12-24 NOTE — Patient Instructions (Signed)
After getting on medicare make appt for "Welcome to medicare" visit.

## 2014-12-24 NOTE — Progress Notes (Signed)
OFFICE VISIT  12/24/2014   CC:  Chief Complaint  Patient presents with  . Follow-up    Pt is fasting.      HPI:    Patient is a 64 y.o. Caucasian male who presents for DM 2, HTN, hyperlip f/u. Home glucs fastings <120.  2H PP 120-160. Home BPs 120s-130s/70s.  HR in 50s-60s.  Compliant with meds w/out side effect except he notes last few weeks he has had more significant bilat knee stiffness and mild pain when he starts to walk, esp after sitting for a while.   This eventually works itself out.  No knee swelling, redness, or warmth and no recent injury. He wonders if the statin could be the culprit.  He did not take the statin today and says he feels like knees are better.  ROS: no myalgias, no CP, no HAs, no SOB, no palpitations, no feet tingling/burning/numbness.  Past Medical History  Diagnosis Date  . Allergy   . Arthritis   . Hypertension   . CAD (coronary artery disease)     CT demonstrated nonobstructive plaques (this test was prompted by borderline ETT.  Marland Kitchen Dyslipidemia   . Obesity, Class II, BMI 35-39.9   . DM type 2 (diabetes mellitus, type 2) ([place]) dx'd 08/2011    Fasting gluc 136 and HbA1c 6.7% at diagnosis.   Marland Kitchen Hx of adenomatous colonic polyps   . Chronic renal insufficiency, stage III (moderate)     Borderline stage II/III, CrCl @ 60 ml/min    Past Surgical History  Procedure Laterality Date  . Remvoal sebaceous cyst  15-20 years ago  . Tonsillectomy    . Colonoscopy w/ polypectomy  2005;08/21/10    adenomatous; also mild diverticulosis; recall 2017.  iFOB neg 09/2011.  . Cardiovascular stress test  02/2013    LexiScan/low-level exercise-Myoview (02/2013): No ischemia, EF 53%, occ symptomatic PVCs.    Outpatient Prescriptions Prior to Visit  Medication Sig Dispense Refill  . aspirin 81 MG tablet Take 81 mg by mouth daily.      Marland Kitchen atorvastatin (LIPITOR) 40 MG tablet TAKE 1 TABLET (40 MG TOTAL) BY MOUTH DAILY. 30 tablet 0  . diclofenac (VOLTAREN) 75 MG EC  tablet TAKE 1 TABLET (75 MG TOTAL) BY MOUTH 2 (TWO) TIMES DAILY AS NEEDED 60 tablet 6  . FREESTYLE LITE test strip USE AS INSTRUCTED 50 each 3  . hydrochlorothiazide (HYDRODIURIL) 25 MG tablet TAKE 1 TABLET (25 MG TOTAL) BY MOUTH DAILY. 30 tablet 0  . Potassium Chloride ER 20 MEQ TBCR TAKE 2 TABLETS (40 MEQ TOTAL) BY MOUTH 2 (TWO) TIMES DAILY. 120 tablet 0   No facility-administered medications prior to visit.    No Known Allergies  ROS As per HPI  PE: Blood pressure 163/94, pulse 46, temperature 97.7 F (36.5 C), temperature source Oral, resp. rate 16, height 5' 5.75" (1.67 m), weight 217 lb (98.431 kg), SpO2 100 %. BP recheck: 150/88 Gen: Alert, well appearing.  Patient is oriented to person, place, time, and situation. AFFECT: pleasant, lucid thought and speech. No further exam today  LABS:  Lab Results  Component Value Date   HGBA1C 6.5 08/13/2014   Lab Results  Component Value Date   CHOL 93 03/10/2014   HDL 32.30* 03/10/2014   LDLCALC 36 03/10/2014   TRIG 123.0 03/10/2014   CHOLHDL 3 03/10/2014     Chemistry      Component Value Date/Time   NA 138 08/13/2014 0819   K 3.6 08/13/2014 2229  CL 102 08/13/2014 0819   CO2 30 08/13/2014 0819   BUN 27* 08/13/2014 0819   CREATININE 1.03 08/13/2014 0819      Component Value Date/Time   CALCIUM 9.7 08/13/2014 0819   ALKPHOS 52 03/10/2014 0836   AST 28 03/10/2014 0836   ALT 38 03/10/2014 0836   BILITOT 1.1 03/10/2014 0836     Lab Results  Component Value Date   TSH 3.69 03/10/2014   IMPRESSION AND PLAN:  1) DM 2: good control with diet. HbA1c today.  2) Hyperlipidemia: FLP, AST/ALT today. Question of statin-induced arthralgias in both knees lately: advised pt to hold his statin x 2 wks and if notes signif improvement then start med again and if sx's return then he will have to decide if he is willing to tolerate this side effect of the med or not.  3) HTN; good control as per home monitoring. Lytes/Cr  today.  4) Hx of hypokalemia (on HCTZ): continue potassium supplement and recheck K today.  An After Visit Summary was printed and given to the patient.  FOLLOW UP: Return for after pt gets medicare he'll make appt for "welcome to medicare" visit.

## 2014-12-27 ENCOUNTER — Other Ambulatory Visit (INDEPENDENT_AMBULATORY_CARE_PROVIDER_SITE_OTHER): Payer: 59

## 2014-12-27 DIAGNOSIS — R7301 Impaired fasting glucose: Secondary | ICD-10-CM | POA: Diagnosis not present

## 2014-12-27 LAB — HEMOGLOBIN A1C: Hgb A1c MFr Bld: 6.9 % — ABNORMAL HIGH (ref 4.6–6.5)

## 2015-01-13 ENCOUNTER — Other Ambulatory Visit: Payer: Self-pay | Admitting: Family Medicine

## 2015-03-18 ENCOUNTER — Ambulatory Visit: Payer: 59 | Admitting: Family Medicine

## 2015-04-14 ENCOUNTER — Encounter: Payer: Self-pay | Admitting: Family Medicine

## 2015-04-14 ENCOUNTER — Ambulatory Visit (INDEPENDENT_AMBULATORY_CARE_PROVIDER_SITE_OTHER): Payer: Medicare Other | Admitting: Family Medicine

## 2015-04-14 VITALS — BP 137/90 | HR 60 | Temp 97.5°F | Resp 16 | Ht 65.75 in | Wt 215.8 lb

## 2015-04-14 DIAGNOSIS — Z23 Encounter for immunization: Secondary | ICD-10-CM | POA: Diagnosis not present

## 2015-04-14 DIAGNOSIS — E119 Type 2 diabetes mellitus without complications: Secondary | ICD-10-CM

## 2015-04-14 DIAGNOSIS — Z0000 Encounter for general adult medical examination without abnormal findings: Secondary | ICD-10-CM

## 2015-04-14 LAB — MICROALBUMIN / CREATININE URINE RATIO
CREATININE, U: 166.2 mg/dL
MICROALB UR: 0.7 mg/dL (ref 0.0–1.9)
Microalb Creat Ratio: 0.4 mg/g (ref 0.0–30.0)

## 2015-04-14 LAB — HEMOGLOBIN A1C: HEMOGLOBIN A1C: 6.9 % — AB (ref 4.6–6.5)

## 2015-04-14 NOTE — Progress Notes (Signed)
  WELCOME TO MEDICARE (IPPE) VISIT I explained that today's visit was for the purpose of health promotion and disease detection, as well as an introduction to Medicare and it's covered benefits.  I explained that no labs or other services would be performed today, but if any were determined to be necessary then appropriate orders/referrals would be arranged for these to be done at a future date.  Patient is a 65 y/o WM who is already an established pt with me.   Pt's medical and social history were reviewed. Specifically, we reviewed PMH/PSH/Meds/FH.  Also reviewed alcohol, tobacco, and illicit drug use.  Diet and physical activity reviewed.  Pt just retired and is trying to start an exercise regimen.  He walks some and is active/not sedentary.   He is working on improving his overall diet. All of this info is also found in the appropriate sections of pt's EMR.  Pt was screened with appropriate screening instrument for depression.  Current or past experiences with mood disorders was discussed.  No problems.  Pt's functional ability and level of safety were reviewed. Specifically, I screened for hearing impairment and fall risk.  I assessed home safety and we discussed pt's competency with activities of daily living.    EXAM: Filed Vitals:   04/14/15 0804  BP: 137/90  Pulse: 60  Temp: 97.5 F (36.4 C)  Resp: 16   Body mass index is 35.09 kg/(m^2). Visual acuity screen: No exam data present . Pt gets routine eye care through optometrist/ophthalmologist and is  up to date with follow up care with this provider. No additional physical exam required or indicated today.  End of life planning: Advanced directives and power of attorney information specific to the patient were discussed.  Pt is currently working on redoing all of his will and Forensic scientist.    Education, counseling, and referrals based on the information obtained/reviewed today:   Education, counseling, and referral for other  preventive services: Written checklist was completed and given to pt for obtaining, as appropriate, the other preventive services that are covered as separate Medicare Part B benefits. Possible services that were reviewed with pt are:  -annual wellness visit (AWV)--yes -Bone mass measurements--NA -Cardiovascular screening blood tests--gets these already by virtue of having DM and HLD. -Colorectal cancer screening--due for repeat after 08/21/15. -Counseling to prevent tobacco use-NA -Diabetes screening tests-NA -Diabetes self-management training (DSMT)--deferred -Glaucoma screening-currently gets via eye MD -HIV screening--deferrs -Medical nutrition therapy--NA -Prostate cancer screening--will get when appropriate -Seasonal influenza, pneumococcal, and Hep B vaccines -screening mammography--NA -screening pap tests and pelvic exam--NA -ultrasound screening for AAA--NA  Of the above listed services, the highlighted areas note specifics for this patient.  Patient did have an additional complaint/problem that was discussed and evaluated today. We reviewed his glucoses: fastings 122-130, 2H PP 105-140s. He is due for Hba1c and urine microalb/cr today.  Patient was given opportunity to ask any additional questions regarding Medicare and covered benefits.  Patient was informed that Medicare does not provide coverage for routine physical exams.  I answered all questions to the best of my ability today.    Follow up: Return in about 4 months (around 08/12/2015) for routine chronic illness f/u (30 min).

## 2015-04-14 NOTE — Progress Notes (Signed)
Pre visit review using our clinic review tool, if applicable. No additional management support is needed unless otherwise documented below in the visit note. 

## 2015-08-08 ENCOUNTER — Encounter: Payer: Self-pay | Admitting: Internal Medicine

## 2015-09-15 ENCOUNTER — Encounter: Payer: Self-pay | Admitting: Internal Medicine

## 2015-10-06 DIAGNOSIS — H16001 Unspecified corneal ulcer, right eye: Secondary | ICD-10-CM | POA: Diagnosis not present

## 2015-10-07 DIAGNOSIS — H16001 Unspecified corneal ulcer, right eye: Secondary | ICD-10-CM | POA: Diagnosis not present

## 2015-10-07 DIAGNOSIS — H1089 Other conjunctivitis: Secondary | ICD-10-CM | POA: Diagnosis not present

## 2015-10-10 DIAGNOSIS — H16001 Unspecified corneal ulcer, right eye: Secondary | ICD-10-CM | POA: Diagnosis not present

## 2015-10-10 DIAGNOSIS — H1789 Other corneal scars and opacities: Secondary | ICD-10-CM | POA: Diagnosis not present

## 2015-10-25 ENCOUNTER — Other Ambulatory Visit: Payer: Self-pay | Admitting: Family Medicine

## 2015-10-25 MED ORDER — DICLOFENAC SODIUM 75 MG PO TBEC: DELAYED_RELEASE_TABLET | ORAL | 6 refills | Status: DC

## 2015-10-25 NOTE — Telephone Encounter (Signed)
I'll RF this med, but pls remind patient he is overdue for f/u diabetes.  Have him make f/u appt anytime in the next couple months at his convenience.-thx

## 2015-10-25 NOTE — Telephone Encounter (Signed)
Left patient detailed message stating we refilled his medication and to f/u within the next few months for DM.  Okay to leave detailed message per DPR.

## 2015-11-04 DIAGNOSIS — H52223 Regular astigmatism, bilateral: Secondary | ICD-10-CM | POA: Diagnosis not present

## 2015-11-04 DIAGNOSIS — H5213 Myopia, bilateral: Secondary | ICD-10-CM | POA: Diagnosis not present

## 2015-11-04 DIAGNOSIS — H25813 Combined forms of age-related cataract, bilateral: Secondary | ICD-10-CM | POA: Diagnosis not present

## 2015-11-04 DIAGNOSIS — E119 Type 2 diabetes mellitus without complications: Secondary | ICD-10-CM | POA: Diagnosis not present

## 2015-11-04 DIAGNOSIS — H524 Presbyopia: Secondary | ICD-10-CM | POA: Diagnosis not present

## 2015-11-07 ENCOUNTER — Other Ambulatory Visit: Payer: Self-pay

## 2015-11-07 ENCOUNTER — Ambulatory Visit (AMBULATORY_SURGERY_CENTER): Payer: Self-pay | Admitting: *Deleted

## 2015-11-07 VITALS — Ht 66.5 in | Wt 222.0 lb

## 2015-11-07 DIAGNOSIS — Z86010 Personal history of colonic polyps: Secondary | ICD-10-CM

## 2015-11-07 MED ORDER — NA SULFATE-K SULFATE-MG SULF 17.5-3.13-1.6 GM/177ML PO SOLN: 1.0000 | Freq: Once | ORAL | 0 refills | Status: AC

## 2015-11-07 MED ORDER — POTASSIUM CHLORIDE ER 20 MEQ PO TBCR: EXTENDED_RELEASE_TABLET | ORAL | 2 refills | Status: DC

## 2015-11-07 NOTE — Progress Notes (Signed)
No egg or soy allergy known to patient  No issues with past sedation with any surgeries  or procedures, no intubation problems  No diet pills per patient No home 02 use per patient  No blood thinners per patient  Pt denies issues with constipation  No A fib or A flutter  emmi video to e mail    

## 2015-11-07 NOTE — Telephone Encounter (Signed)
Potassium Chloride filled per protocol.

## 2015-11-09 ENCOUNTER — Encounter: Payer: Self-pay | Admitting: Family Medicine

## 2015-11-09 ENCOUNTER — Ambulatory Visit (INDEPENDENT_AMBULATORY_CARE_PROVIDER_SITE_OTHER): Payer: Medicare Other | Admitting: Family Medicine

## 2015-11-09 VITALS — BP 146/87 | HR 51 | Temp 97.5°F | Resp 16 | Wt 221.0 lb

## 2015-11-09 DIAGNOSIS — E785 Hyperlipidemia, unspecified: Secondary | ICD-10-CM

## 2015-11-09 DIAGNOSIS — E119 Type 2 diabetes mellitus without complications: Secondary | ICD-10-CM | POA: Diagnosis not present

## 2015-11-09 DIAGNOSIS — N183 Chronic kidney disease, stage 3 unspecified: Secondary | ICD-10-CM

## 2015-11-09 DIAGNOSIS — Z23 Encounter for immunization: Secondary | ICD-10-CM

## 2015-11-09 LAB — COMPREHENSIVE METABOLIC PANEL
ALK PHOS: 48 U/L (ref 39–117)
ALT: 26 U/L (ref 0–53)
AST: 28 U/L (ref 0–37)
Albumin: 4.3 g/dL (ref 3.5–5.2)
BILIRUBIN TOTAL: 0.9 mg/dL (ref 0.2–1.2)
BUN: 25 mg/dL — ABNORMAL HIGH (ref 6–23)
CALCIUM: 9.5 mg/dL (ref 8.4–10.5)
CO2: 29 meq/L (ref 19–32)
Chloride: 103 mEq/L (ref 96–112)
Creatinine, Ser: 0.98 mg/dL (ref 0.40–1.50)
GFR: 81.11 mL/min (ref >60.00)
Glucose, Bld: 117 mg/dL — ABNORMAL HIGH (ref 70–99)
Potassium: 3.5 mEq/L (ref 3.5–5.1)
Sodium: 139 mEq/L (ref 135–145)
Total Protein: 6.7 g/dL (ref 6.0–8.3)

## 2015-11-09 LAB — HEMOGLOBIN A1C: Hgb A1c MFr Bld: 6.9 % — ABNORMAL HIGH (ref 4.6–6.5)

## 2015-11-09 LAB — LIPID PANEL
CHOL/HDL RATIO: 3
Cholesterol: 102 mg/dL (ref 0–200)
HDL: 36.5 mg/dL — AB (ref >39.00)
LDL Cholesterol: 45 mg/dL (ref 0–99)
NonHDL: 65.34
TRIGLYCERIDES: 104 mg/dL (ref 0.0–149.0)
VLDL: 20.8 mg/dL (ref 0.0–40.0)

## 2015-11-09 NOTE — Addendum Note (Signed)
Addended by: Leota Jacobsen on: 11/09/2015 08:32 AM   Modules accepted: Orders

## 2015-11-09 NOTE — Progress Notes (Signed)
OFFICE VISIT  11/09/2015   CC:  Chief Complaint  Patient presents with  . Follow-up  . Diabetes   HPI:    Patient is a 65 y.o. Caucasian male who presents for 6 mo f/u diet-controlled DM, HLD, CRI stage II/III, HTN. He is fasting.  Feet with mild chronic numbness in head of metatarsals region. He cut out statin briefly and no change in bilat knee pains. He then cut out potassium gradually and feels like knees are improved.  Glucoses: avg 140s.  Some elevated 2H PP into 170s. Blood pressure monitoring : <140/90.  He is set up for repeat colonoscopy soon.  Past Medical History:  Diagnosis Date  . Allergy   . Arthritis   . CAD (coronary artery disease)    CT demonstrated nonobstructive plaques (this test was prompted by borderline ETT.  Marland Kitchen Chronic renal insufficiency, stage III (moderate)    Borderline stage II/III, CrCl @ 60 ml/min  . DM type 2 (diabetes mellitus, type 2) ([place]) dx'd 08/2011   Fasting gluc 136 and HbA1c 6.7% at diagnosis.   Marland Kitchen Dyslipidemia   . GERD (gastroesophageal reflux disease)    occ  . Hx of adenomatous colonic polyps   . Hyperlipidemia   . Hypertension   . Obesity, Class II, BMI 35-39.9     Past Surgical History:  Procedure Laterality Date  . CARDIOVASCULAR STRESS TEST  02/2013   LexiScan/low-level exercise-Myoview (02/2013): No ischemia, EF 53%, occ symptomatic PVCs.  . COLONOSCOPY    . COLONOSCOPY W/ POLYPECTOMY  2005;08/21/10   adenomatous; also mild diverticulosis; recall 2017.  iFOB neg 09/2011.  Marland Kitchen POLYPECTOMY    . remvoal sebaceous cyst  15-20 years ago  . TONSILLECTOMY      Outpatient Medications Prior to Visit  Medication Sig Dispense Refill  . aspirin 81 MG tablet Take 81 mg by mouth daily.      Marland Kitchen atorvastatin (LIPITOR) 40 MG tablet TAKE 1 TABLET (40 MG TOTAL) BY MOUTH DAILY. 30 tablet 11  . diclofenac (VOLTAREN) 75 MG EC tablet TAKE 1 TABLET (75 MG TOTAL) BY MOUTH 2 (TWO) TIMES DAILY AS NEEDED 60 tablet 6  . FREESTYLE LITE test strip  USE AS INSTRUCTED 50 each 3  . hydrochlorothiazide (HYDRODIURIL) 25 MG tablet TAKE 1 TABLET (25 MG TOTAL) BY MOUTH DAILY. 30 tablet 11  . Potassium Chloride ER 20 MEQ TBCR TAKE 2 TABLETS (40 MEQ TOTAL) BY MOUTH 2 (TWO) TIMES DAILY. (Patient not taking: Reported on 11/09/2015) 120 tablet 2   No facility-administered medications prior to visit.     No Known Allergies  ROS As per HPI  PE: Blood pressure (!) 146/87, pulse (!) 51, temperature 97.5 F (36.4 C), temperature source Oral, resp. rate 16, weight 221 lb (100.2 kg), SpO2 97 %. Gen: Alert, well appearing.  Patient is oriented to person, place, time, and situation. AFFECT: pleasant, lucid thought and speech. Foot exam - bilateral normal; no swelling, tenderness or skin or vascular lesions. Color and temperature is normal. Sensation is intact. Peripheral pulses are palpable. Toenails are normal.  LABS:  Lab Results  Component Value Date   TSH 3.69 03/10/2014   Lab Results  Component Value Date   WBC 7.2 12/24/2014   HGB 15.0 12/24/2014   HCT 44.8 12/24/2014   MCV 92.8 12/24/2014   PLT 215.0 12/24/2014   Lab Results  Component Value Date   CREATININE 1.03 12/24/2014   BUN 28 (H) 12/24/2014   NA 139 12/24/2014   K 3.8  12/24/2014   CL 101 12/24/2014   CO2 28 12/24/2014   Lab Results  Component Value Date   ALT 51 12/24/2014   AST 40 (H) 12/24/2014   ALKPHOS 57 12/24/2014   BILITOT 0.7 12/24/2014   Lab Results  Component Value Date   CHOL 101 12/24/2014   Lab Results  Component Value Date   HDL 36.10 (L) 12/24/2014   Lab Results  Component Value Date   LDLCALC 40 12/24/2014   Lab Results  Component Value Date   TRIG 126.0 12/24/2014   Lab Results  Component Value Date   CHOLHDL 3 12/24/2014   Lab Results  Component Value Date   PSA 0.77 03/10/2014   PSA 0.53 01/06/2013   PSA 0.44 09/17/2011   Lab Results  Component Value Date   HGBA1C 6.9 (H) 04/14/2015    IMPRESSION AND PLAN:  1) DM 2,  diet controlled. Control is decent. Foot exam normal today. HbA1c today. Flu vaccine high dose today.  2) HTN; The current medical regimen is effective;  continue present plan and medications. Lytes/cr today.  3) HLD: tolerating statine.  FLP and AST/ALT today.  4) CRI stage II/III: BMET today.  5) Hx of adenomatous colon polyps: he is set for repeat colonoscopy soon.  An After Visit Summary was printed and given to the patient.  FOLLOW UP: Return in about 4 months (around 03/10/2016) for routine chronic illness f/u (30 min).  Signed:  Crissie Sickles, MD           11/09/2015

## 2015-11-09 NOTE — Progress Notes (Signed)
Pre visit review using our clinic review tool, if applicable. No additional management support is needed unless otherwise documented below in the visit note. 

## 2015-11-13 ENCOUNTER — Encounter: Payer: Self-pay | Admitting: Family Medicine

## 2015-11-15 ENCOUNTER — Other Ambulatory Visit: Payer: Self-pay | Admitting: Family Medicine

## 2015-11-15 NOTE — Telephone Encounter (Signed)
Duplicate request

## 2015-11-29 ENCOUNTER — Encounter: Payer: Self-pay | Admitting: Internal Medicine

## 2015-11-29 ENCOUNTER — Ambulatory Visit (AMBULATORY_SURGERY_CENTER): Payer: Medicare Other | Admitting: Internal Medicine

## 2015-11-29 VITALS — BP 135/80 | HR 49 | Temp 97.8°F | Resp 12 | Ht 68.0 in | Wt 221.0 lb

## 2015-11-29 DIAGNOSIS — E119 Type 2 diabetes mellitus without complications: Secondary | ICD-10-CM | POA: Diagnosis not present

## 2015-11-29 DIAGNOSIS — D122 Benign neoplasm of ascending colon: Secondary | ICD-10-CM | POA: Diagnosis not present

## 2015-11-29 DIAGNOSIS — I2510 Atherosclerotic heart disease of native coronary artery without angina pectoris: Secondary | ICD-10-CM | POA: Diagnosis not present

## 2015-11-29 DIAGNOSIS — Z86010 Personal history of colonic polyps: Secondary | ICD-10-CM

## 2015-11-29 DIAGNOSIS — E669 Obesity, unspecified: Secondary | ICD-10-CM | POA: Diagnosis not present

## 2015-11-29 DIAGNOSIS — I10 Essential (primary) hypertension: Secondary | ICD-10-CM | POA: Diagnosis not present

## 2015-11-29 MED ORDER — SODIUM CHLORIDE 0.9 % IV SOLN: 500.0000 mL | INTRAVENOUS | Status: DC

## 2015-11-29 NOTE — Progress Notes (Signed)
To recovery, report to [place], RN, VSS 

## 2015-11-29 NOTE — Op Note (Signed)
[name] Patient Name: Michael Hodge Procedure Date: 11/29/2015 10:19 AM MRN: [id number] Endoscopist: Docia Chuck. Henrene Pastor , MD Age: 65 Referring MD:  Date of Birth: 11-Oct-1950 Gender: Male Account #: [id number] Procedure:                Colonoscopy, with cold snare polypectomy x 1 Indications:              High risk colon cancer surveillance: Personal                            history of sessile serrated colon polyp (less than                            10 mm in size) with no dysplasia. Index examination                            2005 (Dr. Velora Heckler) was negativelast examination                            2012 with [facility] Medicines:                Monitored Anesthesia Care Procedure:                Pre-Anesthesia Assessment:                           - Prior to the procedure, a History and Physical                            was performed, and patient medications and                            allergies were reviewed. The patient's tolerance of                            previous anesthesia was also reviewed. The risks                            and benefits of the procedure and the sedation                            options and risks were discussed with the patient.                            All questions were answered, and informed consent                            was obtained. Prior Anticoagulants: The patient has                            taken no previous anticoagulant or antiplatelet                            agents. ASA Grade Assessment: II - A patient with  mild systemic disease. After reviewing the risks                            and benefits, the patient was deemed in                            satisfactory condition to undergo the procedure.                           After obtaining informed consent, the colonoscope                            was passed under direct vision. Throughout the                            procedure, the  patient's blood pressure, pulse, and                            oxygen saturations were monitored continuously. The                            Model CF-HQ190L [id number]) scope was introduced                            through the anus and advanced to the the cecum,                            identified by appendiceal orifice and ileocecal                            valve. The ileocecal valve, appendiceal orifice,                            and rectum were photographed. The quality of the                            bowel preparation was excellent. The colonoscopy                            was performed without difficulty. The patient                            tolerated the procedure well. The bowel preparation                            used was SUPREP. Scope In: 10:33:23 AM Scope Out: 10:49:31 AM Scope Withdrawal Time: 0 hours 12 minutes 18 seconds  Total Procedure Duration: 0 hours 16 minutes 8 seconds  Findings:                 A 3 mm polyp was found in the distal ascending                            colon. The polyp was removed with a cold snare.  Resection and retrieval were complete.                           Multiple diverticula were found in the entire colon.                           Internal hemorrhoids were found during retroflexion.                           The exam was otherwise without abnormality on                            direct and retroflexion views. Complications:            No immediate complications. Estimated blood loss:                            None. Estimated Blood Loss:     Estimated blood loss: none. Impression:               - One 3 mm polyp in the distal ascending colon,                            removed with a cold snare. Resected and retrieved.                           - Diverticulosis in the entire examined colon.                           - Internal hemorrhoids.                           - The examination was otherwise  normal on direct                            and retroflexion views. Recommendation:           - Repeat colonoscopy in 5-10 years for surveillance.                           - Patient has a contact number available for                            emergencies. The signs and symptoms of potential                            delayed complications were discussed with the                            patient. Return to normal activities tomorrow.                            Written discharge instructions were provided to the                            patient.                           -  Resume previous diet.                           - Continue present medications.                           - Await pathology results. Docia Chuck. Henrene Pastor, MD 11/29/2015 10:59:56 AM This report has been signed electronically.

## 2015-11-29 NOTE — Progress Notes (Signed)
Called to room for pathology. 

## 2015-11-29 NOTE — Patient Instructions (Signed)
YOU HAD AN ENDOSCOPIC PROCEDURE TODAY AT THE [facility]:   Refer to the procedure report that was given to you for any specific questions about what was found during the examination.  If the procedure report does not answer your questions, please call your gastroenterologist to clarify.  If you requested that your care partner not be given the details of your procedure findings, then the procedure report has been included in a sealed envelope for you to review at your convenience later.  YOU SHOULD EXPECT: Some feelings of bloating in the abdomen. Passage of more gas than usual.  Walking can help get rid of the air that was put into your GI tract during the procedure and reduce the bloating. If you had a lower endoscopy (such as a colonoscopy or flexible sigmoidoscopy) you may notice spotting of blood in your stool or on the toilet paper. If you underwent a bowel prep for your procedure, you may not have a normal bowel movement for a few days.  Please Note:  You might notice some irritation and congestion in your nose or some drainage.  This is from the oxygen used during your procedure.  There is no need for concern and it should clear up in a day or so.  SYMPTOMS TO REPORT IMMEDIATELY:   Following lower endoscopy (colonoscopy or flexible sigmoidoscopy):  Excessive amounts of blood in the stool  Significant tenderness or worsening of abdominal pains  Swelling of the abdomen that is new, acute  Fever of 100F or higher  For urgent or emergent issues, a gastroenterologist can be reached at any hour by calling [phone].  DIET:  We do recommend a small meal at first, but then you may proceed to your regular diet.  Drink plenty of fluids but you should avoid alcoholic beverages for 24 hours.  ACTIVITY:  You should plan to take it easy for the rest of today and you should NOT DRIVE or use heavy machinery until tomorrow (because of the sedation medicines used during the test).     FOLLOW UP: Our staff will call the number listed on your records the next business day following your procedure to check on you and address any questions or concerns that you may have regarding the information given to you following your procedure. If we do not reach you, we will leave a message.  However, if you are feeling well and you are not experiencing any problems, there is no need to return our call.  We will assume that you have returned to your regular daily activities without incident.  If any biopsies were taken you will be contacted by phone or by letter within the next 1-3 weeks.  Please call us at [phone] if you have not heard about the biopsies in 3 weeks.   SIGNATURES/CONFIDENTIALITY: You and/or your care partner have signed paperwork which will be entered into your electronic medical record.  These signatures attest to the fact that that the information above on your After Visit Summary has been reviewed and is understood.  Full responsibility of the confidentiality of this discharge information lies with you and/or your care-partner.  Await pathology  Please read over handouts about polyps, diverticulosis, hemorrhoids and high fiber diets  Continue your normal medications 

## 2015-11-30 ENCOUNTER — Telehealth: Payer: Self-pay

## 2015-11-30 NOTE — Telephone Encounter (Signed)
  Follow up Call-  Call back number 11/29/2015  Post procedure Call Back phone  # [phone]  Permission to leave phone message Yes  Some recent data might be hidden     Patient questions:  Do you have a fever, pain , or abdominal swelling? No. Pain Score  0 *  Have you tolerated food without any problems? Yes.    Have you been able to return to your normal activities? Yes.    Do you have any questions about your discharge instructions: Diet   No. Medications  No. Follow up visit  No.  Do you have questions or concerns about your Care? No.  Actions: * If pain score is 4 or above: No action needed, pain <4.

## 2015-12-05 ENCOUNTER — Encounter: Payer: Self-pay | Admitting: Family Medicine

## 2015-12-07 ENCOUNTER — Encounter: Payer: Self-pay | Admitting: Internal Medicine

## 2015-12-12 ENCOUNTER — Encounter: Payer: Self-pay | Admitting: Family Medicine

## 2016-01-13 ENCOUNTER — Other Ambulatory Visit: Payer: Self-pay

## 2016-01-13 MED ORDER — ATORVASTATIN CALCIUM 40 MG PO TABS: ORAL_TABLET | ORAL | 11 refills | Status: DC

## 2016-01-13 MED ORDER — HYDROCHLOROTHIAZIDE 25 MG PO TABS: ORAL_TABLET | ORAL | 11 refills | Status: DC

## 2016-01-13 NOTE — Telephone Encounter (Signed)
refills sent

## 2016-02-10 ENCOUNTER — Encounter: Payer: Self-pay | Admitting: Family Medicine

## 2016-02-10 ENCOUNTER — Ambulatory Visit (INDEPENDENT_AMBULATORY_CARE_PROVIDER_SITE_OTHER): Payer: Medicare Other | Admitting: Family Medicine

## 2016-02-10 VITALS — BP 138/74 | HR 65 | Temp 97.8°F | Resp 16 | Ht 65.75 in | Wt 215.8 lb

## 2016-02-10 DIAGNOSIS — J069 Acute upper respiratory infection, unspecified: Secondary | ICD-10-CM | POA: Diagnosis not present

## 2016-02-10 DIAGNOSIS — B9789 Other viral agents as the cause of diseases classified elsewhere: Secondary | ICD-10-CM

## 2016-02-10 MED ORDER — HYDROCODONE-HOMATROPINE 5-1.5 MG/5ML PO SYRP: ORAL_SOLUTION | ORAL | 0 refills | Status: DC

## 2016-02-10 NOTE — Progress Notes (Signed)
OFFICE VISIT  02/10/2016   CC:  Chief Complaint  Patient presents with  . URI    x 3 days   HPI:    Patient is a 65 y.o. Caucasian male who presents for respiratory symptoms. Onset with dry/scratchy throat 3 d/a, gradually feeling nasal congestion, forehead pressure/peri-orbital pressure. Lots of coughing last night.  Took sudafed and did sinus rinse this morning.  No fever.  No n/v/d or body aches. Took mucinex dm early this morning.  No SOB or wheezing.  Past Medical History:  Diagnosis Date  . Allergy   . Arthritis   . CAD (coronary artery disease)    CT demonstrated nonobstructive plaques (this test was prompted by borderline ETT.  Marland Kitchen Chronic renal insufficiency, stage III (moderate)    Borderline stage II/III, CrCl @ 60 ml/min  . Diabetes mellitus without complication ([place]) dx'd [id number]   Fasting gluc 136 and HbA1c 6.7% at diagnosis.   . Diverticulosis   . Dyslipidemia   . GERD (gastroesophageal reflux disease)    occ  . Hx of adenomatous colonic polyps   . Hyperlipidemia   . Hypertension   . Internal hemorrhoids   . Obesity, Class II, BMI 35-39.9     Past Surgical History:  Procedure Laterality Date  . CARDIOVASCULAR STRESS TEST  02/2013   LexiScan/low-level exercise-Myoview (02/2013): No ischemia, EF 53%, occ symptomatic PVCs.  . COLONOSCOPY    . COLONOSCOPY W/ POLYPECTOMY  2005;08/21/10;11/29/15   adenomatous; also mild diverticulosis; recall 2017.  iFOB neg 09/2011.  11/2015: tubular adenoma x 1. Recall 5 yrs (11/2020)  . POLYPECTOMY    . remvoal sebaceous cyst  15-20 years ago  . TONSILLECTOMY      Outpatient Medications Prior to Visit  Medication Sig Dispense Refill  . aspirin 81 MG tablet Take 81 mg by mouth daily.      Marland Kitchen atorvastatin (LIPITOR) 40 MG tablet TAKE 1 TABLET (40 MG TOTAL) BY MOUTH DAILY. 30 tablet 11  . diclofenac (VOLTAREN) 75 MG EC tablet TAKE 1 TABLET (75 MG TOTAL) BY MOUTH 2 (TWO) TIMES DAILY AS NEEDED 60 tablet 6  . FREESTYLE LITE test  strip USE AS INSTRUCTED 50 each 3  . hydrochlorothiazide (HYDRODIURIL) 25 MG tablet TAKE 1 TABLET (25 MG TOTAL) BY MOUTH DAILY. 30 tablet 11  . Potassium Chloride ER 20 MEQ TBCR TAKE 2 TABLETS (40 MEQ TOTAL) BY MOUTH 2 (TWO) TIMES DAILY. (Patient not taking: Reported on 02/10/2016) 120 tablet 2   Facility-Administered Medications Prior to Visit  Medication Dose Route Frequency Provider Last Rate Last Dose  . 0.9 %  sodium chloride infusion  500 mL Intravenous Continuous Irene Shipper, MD        No Known Allergies  ROS As per HPI  PE: Blood pressure 138/74, pulse 65, temperature 97.8 F (36.6 C), temperature source Oral, resp. rate 16, height 5' 5.75" (1.67 m), weight 215 lb 12 oz (97.9 kg), SpO2 100 %. VS: noted--normal. Gen: alert, NAD, NONTOXIC APPEARING. HEENT: eyes without injection, drainage, or swelling.  Ears: EACs clear, TMs with normal light reflex and landmarks.  Nose: Clear rhinorrhea, with some dried, crusty exudate adherent to mildly injected mucosa.  No purulent d/c.  No paranasal sinus TTP.  No facial swelling.  Throat and mouth without focal lesion.  No pharyngial swelling, erythema, or exudate.   Neck: supple, no LAD.   LUNGS: CTA bilat, nonlabored resps.  He coughed once at the end of one exhalation. CV: RRR, no m/r/g. EXT:  no c/c/e SKIN: no rash  LABS:  none  IMPRESSION AND PLAN:  Viral URI with cough. No sign of RAD at this time. Discussed symptomatic care: Get otc generic robitussin DM OR Mucinex DM and use as directed on the packaging for cough and congestion. Use otc generic saline nasal spray 2-3 times per day to irrigate/moisturize your nasal passages. Watch bp at home with sudafed use. Will add hycodan syrup, 1-2 tsp qhs prn cough.  An After Visit Summary was printed and given to the patient.  FOLLOW UP: Return if symptoms worsen or fail to improve.  Signed:  Crissie Sickles, MD           02/10/2016

## 2016-02-10 NOTE — Progress Notes (Signed)
Pre visit review using our clinic review tool, if applicable. No additional management support is needed unless otherwise documented below in the visit note. 

## 2016-02-27 HISTORY — PX: MOHS SURGERY: SUR867

## 2016-03-06 ENCOUNTER — Ambulatory Visit (INDEPENDENT_AMBULATORY_CARE_PROVIDER_SITE_OTHER): Payer: Medicare Other | Admitting: Family Medicine

## 2016-03-06 ENCOUNTER — Encounter: Payer: Self-pay | Admitting: Family Medicine

## 2016-03-06 VITALS — BP 124/76 | HR 61 | Temp 97.6°F | Resp 16 | Ht 65.75 in | Wt 206.5 lb

## 2016-03-06 DIAGNOSIS — E1122 Type 2 diabetes mellitus with diabetic chronic kidney disease: Secondary | ICD-10-CM

## 2016-03-06 DIAGNOSIS — Z1159 Encounter for screening for other viral diseases: Secondary | ICD-10-CM

## 2016-03-06 DIAGNOSIS — Z125 Encounter for screening for malignant neoplasm of prostate: Secondary | ICD-10-CM | POA: Diagnosis not present

## 2016-03-06 DIAGNOSIS — N183 Chronic kidney disease, stage 3 unspecified: Secondary | ICD-10-CM

## 2016-03-06 DIAGNOSIS — E089 Diabetes mellitus due to underlying condition without complications: Secondary | ICD-10-CM

## 2016-03-06 DIAGNOSIS — I10 Essential (primary) hypertension: Secondary | ICD-10-CM

## 2016-03-06 LAB — BASIC METABOLIC PANEL
BUN: 28 mg/dL — AB (ref 6–23)
CO2: 28 mEq/L (ref 19–32)
Calcium: 9.7 mg/dL (ref 8.4–10.5)
Chloride: 99 mEq/L (ref 96–112)
Creatinine, Ser: 1.02 mg/dL (ref 0.40–1.50)
GFR: 77.38 mL/min (ref >60.00)
Glucose, Bld: 114 mg/dL — ABNORMAL HIGH (ref 70–99)
POTASSIUM: 3.4 meq/L — AB (ref 3.5–5.1)
SODIUM: 138 meq/L (ref 135–145)

## 2016-03-06 LAB — HEMOGLOBIN A1C: HEMOGLOBIN A1C: 7 % — AB (ref 4.6–6.5)

## 2016-03-06 LAB — PSA, MEDICARE: PSA: 1.93 ng/mL (ref 0.10–4.00)

## 2016-03-06 NOTE — Progress Notes (Signed)
OFFICE VISIT  03/06/2016   CC:  Chief Complaint  Patient presents with  . Follow-up    RCI, pt is fasting.    HPI:    Patient is a 66 y.o. Caucasian male who presents for 4 mo f/u diet controlled DM 2, hyperlipidemia, HTN, CRI stage II/III. Feeling well.  DM: home gluc's normal.  Has lost a few lbs since last visit.   HTN: home bp's <120/80. HLD: tolerating statin w/out problem.  Pt is due for prostate cancer screening so we discussed this today.  Past Medical History:  Diagnosis Date  . Allergy   . Arthritis   . CAD (coronary artery disease)    CT demonstrated nonobstructive plaques (this test was prompted by borderline ETT.  Marland Kitchen Chronic renal insufficiency, stage III (moderate)    Borderline stage II/III, CrCl @ 60 ml/min  . Diabetes mellitus without complication ([place]) dx'd [id number]   Fasting gluc 136 and HbA1c 6.7% at diagnosis.   . Diverticulosis   . Dyslipidemia   . GERD (gastroesophageal reflux disease)    occ  . Hx of adenomatous colonic polyps   . Hyperlipidemia   . Hypertension   . Internal hemorrhoids   . Obesity, Class II, BMI 35-39.9     Past Surgical History:  Procedure Laterality Date  . CARDIOVASCULAR STRESS TEST  02/2013   LexiScan/low-level exercise-Myoview (02/2013): No ischemia, EF 53%, occ symptomatic PVCs.  . COLONOSCOPY    . COLONOSCOPY W/ POLYPECTOMY  2005;08/21/10;11/29/15   adenomatous; also mild diverticulosis; recall 2017.  iFOB neg 09/2011.  11/2015: tubular adenoma x 1. Recall 5 yrs (11/2020)  . POLYPECTOMY    . remvoal sebaceous cyst  15-20 years ago  . TONSILLECTOMY      Outpatient Medications Prior to Visit  Medication Sig Dispense Refill  . aspirin 81 MG tablet Take 81 mg by mouth daily.      Marland Kitchen atorvastatin (LIPITOR) 40 MG tablet TAKE 1 TABLET (40 MG TOTAL) BY MOUTH DAILY. 30 tablet 11  . diclofenac (VOLTAREN) 75 MG EC tablet TAKE 1 TABLET (75 MG TOTAL) BY MOUTH 2 (TWO) TIMES DAILY AS NEEDED 60 tablet 6  . FREESTYLE LITE test strip USE  AS INSTRUCTED 50 each 3  . hydrochlorothiazide (HYDRODIURIL) 25 MG tablet TAKE 1 TABLET (25 MG TOTAL) BY MOUTH DAILY. 30 tablet 11  . HYDROcodone-homatropine (HYCODAN) 5-1.5 MG/5ML syrup 1-2 tsp po qhs prn cough (Patient not taking: Reported on 03/06/2016) 120 mL 0   Facility-Administered Medications Prior to Visit  Medication Dose Route Frequency Provider Last Rate Last Dose  . 0.9 %  sodium chloride infusion  500 mL Intravenous Continuous Irene Shipper, MD        No Known Allergies  ROS As per HPI  PE: Blood pressure 124/76, pulse 61, temperature 97.6 F (36.4 C), temperature source Oral, resp. rate 16, height 5' 5.75" (1.67 m), weight 206 lb 8 oz (93.7 kg), SpO2 98 %. Gen: Alert, well appearing.  Patient is oriented to person, place, time, and situation. AFFECT: pleasant, lucid thought and speech. Rectal exam: negative without mass, lesions or tenderness, PROSTATE EXAM: smooth and symmetric without nodules or tenderness.   LABS:  Lab Results  Component Value Date   TSH 3.69 03/10/2014   Lab Results  Component Value Date   WBC 7.2 12/24/2014   HGB 15.0 12/24/2014   HCT 44.8 12/24/2014   MCV 92.8 12/24/2014   PLT 215.0 12/24/2014   Lab Results  Component Value Date   CREATININE  0.98 11/09/2015   BUN 25 (H) 11/09/2015   NA 139 11/09/2015   K 3.5 11/09/2015   CL 103 11/09/2015   CO2 29 11/09/2015   Lab Results  Component Value Date   ALT 26 11/09/2015   AST 28 11/09/2015   ALKPHOS 48 11/09/2015   BILITOT 0.9 11/09/2015   Lab Results  Component Value Date   CHOL 102 11/09/2015   Lab Results  Component Value Date   HDL 36.50 (L) 11/09/2015   Lab Results  Component Value Date   LDLCALC 45 11/09/2015   Lab Results  Component Value Date   TRIG 104.0 11/09/2015   Lab Results  Component Value Date   CHOLHDL 3 11/09/2015   Lab Results  Component Value Date   PSA 0.77 03/10/2014   PSA 0.53 01/06/2013   PSA 0.44 09/17/2011   Lab Results  Component Value  Date   HGBA1C 6.9 (H) 11/09/2015    IMPRESSION AND PLAN:  1) DM 2, diet-controlled: good control. HbA1c today.  2) HTN; The current medical regimen is effective;  continue present plan and medications. Lytes/cr today.  3) Hyperlipidemia: tolerating statin.  Lipid panel great 10/2015.  AST/ALT normal 10/2015.  4) CRI stage II/III: BMET today.  5) Prostate ca screening: DRE normal today.  PSA drawn.  6) Preventative health care; pt desired Hep C screening today so I ordered this lab.  An After Visit Summary was printed and given to the patient.  FOLLOW UP: Return in about 4 months (around 07/04/2016) for routine chronic illness f/u.  Signed:  Crissie Sickles, MD           03/06/2016

## 2016-03-06 NOTE — Progress Notes (Signed)
Pre visit review using our clinic review tool, if applicable. No additional management support is needed unless otherwise documented below in the visit note. 

## 2016-03-07 ENCOUNTER — Other Ambulatory Visit: Payer: Self-pay | Admitting: Family Medicine

## 2016-03-07 LAB — HEPATITIS C ANTIBODY: HCV AB: NEGATIVE

## 2016-03-07 MED ORDER — POTASSIUM CHLORIDE CRYS ER 20 MEQ PO TBCR: EXTENDED_RELEASE_TABLET | ORAL | 3 refills | Status: DC

## 2016-06-07 ENCOUNTER — Other Ambulatory Visit: Payer: Self-pay | Admitting: Family Medicine

## 2016-06-07 NOTE — Telephone Encounter (Signed)
Michael Hodge.  RF request for diclofenac LOV: 03/06/16 Next ov: 07/04/16 Last written: 10/25/15 #60 w/ 6RF  Please advise. Thanks.

## 2016-07-04 ENCOUNTER — Ambulatory Visit: Payer: Medicare Other | Admitting: Family Medicine

## 2016-07-12 ENCOUNTER — Encounter: Payer: Self-pay | Admitting: Family Medicine

## 2016-07-12 ENCOUNTER — Ambulatory Visit (INDEPENDENT_AMBULATORY_CARE_PROVIDER_SITE_OTHER): Payer: Medicare Other | Admitting: Family Medicine

## 2016-07-12 VITALS — BP 138/73 | HR 47 | Temp 97.7°F | Resp 16 | Ht 65.75 in | Wt 208.2 lb

## 2016-07-12 DIAGNOSIS — I10 Essential (primary) hypertension: Secondary | ICD-10-CM

## 2016-07-12 DIAGNOSIS — E119 Type 2 diabetes mellitus without complications: Secondary | ICD-10-CM | POA: Diagnosis not present

## 2016-07-12 DIAGNOSIS — N183 Chronic kidney disease, stage 3 unspecified: Secondary | ICD-10-CM

## 2016-07-12 DIAGNOSIS — Z0000 Encounter for general adult medical examination without abnormal findings: Secondary | ICD-10-CM | POA: Diagnosis not present

## 2016-07-12 DIAGNOSIS — E7800 Pure hypercholesterolemia, unspecified: Secondary | ICD-10-CM | POA: Diagnosis not present

## 2016-07-12 LAB — BASIC METABOLIC PANEL
BUN: 27 mg/dL — ABNORMAL HIGH (ref 6–23)
CALCIUM: 10.1 mg/dL (ref 8.4–10.5)
CHLORIDE: 101 meq/L (ref 96–112)
CO2: 30 meq/L (ref 19–32)
Creatinine, Ser: 1 mg/dL (ref 0.40–1.50)
GFR: 79.08 mL/min (ref >60.00)
GLUCOSE: 113 mg/dL — AB (ref 70–99)
POTASSIUM: 3.5 meq/L (ref 3.5–5.1)
SODIUM: 140 meq/L (ref 135–145)

## 2016-07-12 LAB — MICROALBUMIN / CREATININE URINE RATIO
CREATININE, U: 91.3 mg/dL
MICROALB/CREAT RATIO: 0.8 mg/g (ref 0.0–30.0)
Microalb, Ur: <0.7 mg/dL (ref 0.0–1.9)

## 2016-07-12 LAB — HEMOGLOBIN A1C: HEMOGLOBIN A1C: 7.1 % — AB (ref 4.6–6.5)

## 2016-07-12 NOTE — Progress Notes (Signed)
OFFICE VISIT  07/12/2016   CC:  Chief Complaint  Patient presents with  . Follow-up    RCI, pt is fasting.    HPI:    Patient is a 66 y.o. Caucasian male who presents for 4 mo f/u diet controlled DM 2, HTN, HLD, and CRI stage III.  DM: home glucoses reviewed today--120s-160s, usually higher in morning than afternoon. Working on TLC to lose wt.  Exercise: walking 3-4 days per week.  HTN: home monitoring consistently <120/60s.    CRI: he avoids otc nsaids, but takes diclofenac 75mg  qd for generalized osteoarthritis. He tries to pay attention to good hydration.  HLD: takes generic lipitor 40mg  qd.  No signif side effects.  Past Medical History:  Diagnosis Date  . Allergy   . Arthritis   . CAD (coronary artery disease)    CT demonstrated nonobstructive plaques (this test was prompted by borderline ETT.  Marland Kitchen Chronic renal insufficiency, stage III (moderate)    Borderline stage II/III, CrCl @ 60 ml/min  . Diabetes mellitus without complication ([place]) dx'd 10/238   Fasting gluc 136 and HbA1c 6.7% at diagnosis.   . Diverticulosis   . Dyslipidemia   . GERD (gastroesophageal reflux disease)    occ  . Hx of adenomatous colonic polyps   . Hyperlipidemia   . Hypertension   . Internal hemorrhoids   . Obesity, Class II, BMI 35-39.9     Past Surgical History:  Procedure Laterality Date  . CARDIOVASCULAR STRESS TEST  02/2013   LexiScan/low-level exercise-Myoview (02/2013): No ischemia, EF 53%, occ symptomatic PVCs.  . COLONOSCOPY    . COLONOSCOPY W/ POLYPECTOMY  2005;08/21/10;11/29/15   adenomatous; also mild diverticulosis; recall 2017.  iFOB neg 09/2011.  11/2015: tubular adenoma x 1. Recall 5 yrs (11/2020)  . POLYPECTOMY    . remvoal sebaceous cyst  15-20 years ago  . TONSILLECTOMY      Outpatient Medications Prior to Visit  Medication Sig Dispense Refill  . aspirin 81 MG tablet Take 81 mg by mouth daily.      Marland Kitchen atorvastatin (LIPITOR) 40 MG tablet TAKE 1 TABLET (40 MG TOTAL) BY  MOUTH DAILY. 30 tablet 11  . diclofenac (VOLTAREN) 75 MG EC tablet TAKE 1 TABLET (75 MG TOTAL) BY MOUTH 2 (TWO) TIMES DAILY AS NEEDED 60 tablet 5  . FREESTYLE LITE test strip USE AS INSTRUCTED 50 each 3  . hydrochlorothiazide (HYDRODIURIL) 25 MG tablet TAKE 1 TABLET (25 MG TOTAL) BY MOUTH DAILY. 30 tablet 11  . potassium chloride SA (K-DUR,KLOR-CON) 20 MEQ tablet 2 tabs po qd 60 tablet 3   Facility-Administered Medications Prior to Visit  Medication Dose Route Frequency Provider Last Rate Last Dose  . 0.9 %  sodium chloride infusion  500 mL Intravenous Continuous Irene Shipper, MD        No Known Allergies  ROS As per HPI  PE: Blood pressure 138/73, pulse (!) 47, temperature 97.7 F (36.5 C), temperature source Oral, resp. rate 16, height 5' 5.75" (1.67 m), weight 208 lb 4 oz (94.5 kg), SpO2 100 %. Gen: Alert, well appearing.  Patient is oriented to person, place, time, and situation. AFFECT: pleasant, lucid thought and speech. CV: RRR, no m/r/g.   LUNGS: CTA bilat, nonlabored resps, good aeration in all lung fields. EXT: no clubbing, cyanosis, or edema.   LABS:  Lab Results  Component Value Date   TSH 3.69 03/10/2014   Lab Results  Component Value Date   WBC 7.2 12/24/2014  HGB 15.0 12/24/2014   HCT 44.8 12/24/2014   MCV 92.8 12/24/2014   PLT 215.0 12/24/2014   Lab Results  Component Value Date   CREATININE 1.02 03/06/2016   BUN 28 (H) 03/06/2016   NA 138 03/06/2016   K 3.4 (L) 03/06/2016   CL 99 03/06/2016   CO2 28 03/06/2016   Lab Results  Component Value Date   ALT 26 11/09/2015   AST 28 11/09/2015   ALKPHOS 48 11/09/2015   BILITOT 0.9 11/09/2015   Lab Results  Component Value Date   CHOL 102 11/09/2015   Lab Results  Component Value Date   HDL 36.50 (L) 11/09/2015   Lab Results  Component Value Date   LDLCALC 45 11/09/2015   Lab Results  Component Value Date   TRIG 104.0 11/09/2015   Lab Results  Component Value Date   CHOLHDL 3 11/09/2015    Lab Results  Component Value Date   PSA 1.93 03/06/2016   PSA 0.77 03/10/2014   PSA 0.53 01/06/2013   Lab Results  Component Value Date   HGBA1C 7.0 (H) 03/06/2016   IMPRESSION AND PLAN:  1) DM 2, diet controlled. HbA1c today. Urine microalb/cr today.  2) HTN: The current medical regimen is effective;  continue present plan and medications.  3) HLD: tolerating statin.  Lipid panel 10/2015 excellent, AST/ALT normal at that time. Plan repeat FLP at next f/u in 4 mo.  4) CRI stage III: avoiding otc nsaids but takes 1 diclofenac most days.  Pays close attention to good hydration. Lytes/cr today.  5) Preventative health care: shingrix rx given to pt today at his request.  An After Visit Summary was printed and given to the patient.  FOLLOW UP: Return in about 4 months (around 11/12/2016) for routine chronic illness f/u.  Also needs AWV appt with KIM,.  Signed:  Crissie Sickles, MD           07/12/2016

## 2016-11-13 ENCOUNTER — Ambulatory Visit: Payer: Medicare Other

## 2016-11-13 ENCOUNTER — Ambulatory Visit: Payer: Medicare Other | Admitting: Family Medicine

## 2016-11-30 ENCOUNTER — Encounter: Payer: Self-pay | Admitting: Family Medicine

## 2016-11-30 ENCOUNTER — Ambulatory Visit (INDEPENDENT_AMBULATORY_CARE_PROVIDER_SITE_OTHER): Payer: Medicare Other | Admitting: Family Medicine

## 2016-11-30 ENCOUNTER — Ambulatory Visit: Payer: Medicare Other

## 2016-11-30 VITALS — BP 126/78 | HR 76 | Temp 97.7°F | Resp 18 | Ht 66.0 in | Wt 206.8 lb

## 2016-11-30 DIAGNOSIS — E119 Type 2 diabetes mellitus without complications: Secondary | ICD-10-CM

## 2016-11-30 DIAGNOSIS — I10 Essential (primary) hypertension: Secondary | ICD-10-CM

## 2016-11-30 DIAGNOSIS — Z0000 Encounter for general adult medical examination without abnormal findings: Secondary | ICD-10-CM

## 2016-11-30 DIAGNOSIS — Z23 Encounter for immunization: Secondary | ICD-10-CM

## 2016-11-30 DIAGNOSIS — Z1283 Encounter for screening for malignant neoplasm of skin: Secondary | ICD-10-CM | POA: Diagnosis not present

## 2016-11-30 DIAGNOSIS — E7800 Pure hypercholesterolemia, unspecified: Secondary | ICD-10-CM | POA: Diagnosis not present

## 2016-11-30 DIAGNOSIS — H5213 Myopia, bilateral: Secondary | ICD-10-CM | POA: Diagnosis not present

## 2016-11-30 DIAGNOSIS — H524 Presbyopia: Secondary | ICD-10-CM | POA: Diagnosis not present

## 2016-11-30 DIAGNOSIS — H52223 Regular astigmatism, bilateral: Secondary | ICD-10-CM | POA: Diagnosis not present

## 2016-11-30 LAB — LIPID PANEL
CHOLESTEROL: 85 mg/dL (ref 0–200)
HDL: 35.9 mg/dL — AB (ref >39.00)
LDL Cholesterol: 30 mg/dL (ref 0–99)
NonHDL: 49.47
TRIGLYCERIDES: 95 mg/dL (ref 0.0–149.0)
Total CHOL/HDL Ratio: 2
VLDL: 19 mg/dL (ref 0.0–40.0)

## 2016-11-30 LAB — HEMOGLOBIN A1C: Hgb A1c MFr Bld: 7.1 % — ABNORMAL HIGH (ref 4.6–6.5)

## 2016-11-30 LAB — HM DIABETES EYE EXAM

## 2016-11-30 LAB — COMPREHENSIVE METABOLIC PANEL
ALBUMIN: 4.5 g/dL (ref 3.5–5.2)
ALT: 27 U/L (ref 0–53)
AST: 24 U/L (ref 0–37)
Alkaline Phosphatase: 54 U/L (ref 39–117)
BUN: 23 mg/dL (ref 6–23)
CALCIUM: 9.9 mg/dL (ref 8.4–10.5)
CO2: 30 mEq/L (ref 19–32)
CREATININE: 0.95 mg/dL (ref 0.40–1.50)
Chloride: 100 mEq/L (ref 96–112)
GFR: 83.81 mL/min (ref >60.00)
Glucose, Bld: 119 mg/dL — ABNORMAL HIGH (ref 70–99)
Potassium: 3.4 mEq/L — ABNORMAL LOW (ref 3.5–5.1)
Sodium: 138 mEq/L (ref 135–145)
Total Bilirubin: 1 mg/dL (ref 0.2–1.2)
Total Protein: 7 g/dL (ref 6.0–8.3)

## 2016-11-30 NOTE — Progress Notes (Signed)
AWV reviewed and agree.  Signed:  Crissie Sickles, MD           11/30/2016

## 2016-11-30 NOTE — Patient Instructions (Signed)
Continue doing brain stimulating activities (puzzles, reading, adult coloring books, staying active) to keep memory sharp.   Bring a copy of your living will and/or healthcare power of attorney to your next office visit.   Health Maintenance, Male A healthy lifestyle and preventive care is important for your health and wellness. Ask your health care provider about what schedule of regular examinations is right for you. What should I know about weight and diet? Eat a Healthy Diet  Eat plenty of vegetables, fruits, whole grains, low-fat dairy products, and lean protein.  Do not eat a lot of foods high in solid fats, added sugars, or salt.  Maintain a Healthy Weight Regular exercise can help you achieve or maintain a healthy weight. You should:  Do at least 150 minutes of exercise each week. The exercise should increase your heart rate and make you sweat (moderate-intensity exercise).  Do strength-training exercises at least twice a week.  Watch Your Levels of Cholesterol and Blood Lipids  Have your blood tested for lipids and cholesterol every 5 years starting at 68 years of age. If you are at high risk for heart disease, you should start having your blood tested when you are 68 years old. You may need to have your cholesterol levels checked more often if: ? Your lipid or cholesterol levels are high. ? You are older than 68 years of age. ? You are at high risk for heart disease.  What should I know about cancer screening? Many types of cancers can be detected early and may often be prevented. Lung Cancer  You should be screened every year for lung cancer if: ? You are a current smoker who has smoked for at least 30 years. ? You are a former smoker who has quit within the past 15 years.  Talk to your health care provider about your screening options, when you should start screening, and how often you should be screened.  Colorectal Cancer  Routine colorectal cancer screening  usually begins at 68 years of age and should be repeated every 5-10 years until you are 68 years old. You may need to be screened more often if early forms of precancerous polyps or small growths are found. Your health care provider may recommend screening at an earlier age if you have risk factors for colon cancer.  Your health care provider may recommend using home test kits to check for hidden blood in the stool.  A small camera at the end of a tube can be used to examine your colon (sigmoidoscopy or colonoscopy). This checks for the earliest forms of colorectal cancer.  Prostate and Testicular Cancer  Depending on your age and overall health, your health care provider may do certain tests to screen for prostate and testicular cancer.  Talk to your health care provider about any symptoms or concerns you have about testicular or prostate cancer.  Skin Cancer  Check your skin from head to toe regularly.  Tell your health care provider about any new moles or changes in moles, especially if: ? There is a change in a mole's size, shape, or color. ? You have a mole that is larger than a pencil eraser.  Always use sunscreen. Apply sunscreen liberally and repeat throughout the day.  Protect yourself by wearing long sleeves, pants, a wide-brimmed hat, and sunglasses when outside.  What should I know about heart disease, diabetes, and high blood pressure?  If you are 18-39 years of age, have your blood pressure checked every   3-5 years. If you are 40 years of age or older, have your blood pressure checked every year. You should have your blood pressure measured twice-once when you are at a hospital or clinic, and once when you are not at a hospital or clinic. Record the average of the two measurements. To check your blood pressure when you are not at a hospital or clinic, you can use: ? An automated blood pressure machine at a pharmacy. ? A home blood pressure monitor.  Talk to your health care  provider about your target blood pressure.  If you are between 45-79 years old, ask your health care provider if you should take aspirin to prevent heart disease.  Have regular diabetes screenings by checking your fasting blood sugar level. ? If you are at a normal weight and have a low risk for diabetes, have this test once every three years after the age of 45. ? If you are overweight and have a high risk for diabetes, consider being tested at a younger age or more often.  A one-time screening for abdominal aortic aneurysm (AAA) by ultrasound is recommended for men aged 65-75 years who are current or former smokers. What should I know about preventing infection? Hepatitis B If you have a higher risk for hepatitis B, you should be screened for this virus. Talk with your health care provider to find out if you are at risk for hepatitis B infection. Hepatitis C Blood testing is recommended for:  Everyone born from 1945 through 1965.  Anyone with known risk factors for hepatitis C.  Sexually Transmitted Diseases (STDs)  You should be screened each year for STDs including gonorrhea and chlamydia if: ? You are sexually active and are younger than 68 years of age. ? You are older than 68 years of age and your health care provider tells you that you are at risk for this type of infection. ? Your sexual activity has changed since you were last screened and you are at an increased risk for chlamydia or gonorrhea. Ask your health care provider if you are at risk.  Talk with your health care provider about whether you are at high risk of being infected with HIV. Your health care provider may recommend a prescription medicine to help prevent HIV infection.  What else can I do?  Schedule regular health, dental, and eye exams.  Stay current with your vaccines (immunizations).  Do not use any tobacco products, such as cigarettes, chewing tobacco, and e-cigarettes. If you need help quitting, ask  your health care provider.  Limit alcohol intake to no more than 2 drinks per day. One drink equals 12 ounces of beer, 5 ounces of wine, or 1 ounces of hard liquor.  Do not use street drugs.  Do not share needles.  Ask your health care provider for help if you need support or information about quitting drugs.  Tell your health care provider if you often feel depressed.  Tell your health care provider if you have ever been abused or do not feel safe at home. This information is not intended to replace advice given to you by your health care provider. Make sure you discuss any questions you have with your health care provider. Document Released: 08/11/2007 Document Revised: 10/12/2015 Document Reviewed: 11/16/2014 [employer] Interactive Patient Education  2018 [employer]  

## 2016-11-30 NOTE — Progress Notes (Signed)
OFFICE VISIT  11/30/2016   CC:  Chief Complaint  Patient presents with  . Medicare Wellness   HPI:    Patient is a 66 y.o. Caucasian male who presents for 4 mo f/u DM 2, HTN, HLD, CRI with GFR @ 60 ml/min. Labs at last f/u visit were stable: A1c 7.1%--no changes made.  DM 2: home glucoses: avg 120s-140s fasting and 2H PP.  Occ fasting around 100. No burning, tingling, or numbness in feet. Has eye exam appt today.  HTN: home bp's: avg <130 over 80.  HR usually high 50s to low 60s.  HLD: taking atorvastatin w/out side effect.  CRI: takes diclofenac 75 qd usually, rarely bid.  Drinking a lot of water.  No soft drinks.    KNEE: Tripped by his large dog running between his legs while at beach recently a little over a month ago, right knee hit concrete garage floor.  It swelled up, was sore.  The more he walked the more it felt better.  Feels well now except some pain when he knees on R knee.   Oddly he notes increased feeling of joint stiffness/discomfort after he takes potassium for 3 days in a row. He takes the potassium occasionally for tx of leg cramps and he feels like this helps.   Past Medical History:  Diagnosis Date  . Allergy   . Arthritis   . CAD (coronary artery disease)    CT demonstrated nonobstructive plaques (this test was prompted by borderline ETT.  Marland Kitchen Chronic renal insufficiency, stage III (moderate) (HCC)    Borderline stage II/III, CrCl @ 60 ml/min  . Diabetes mellitus without complication ([place]) dx'd 02/5945   Fasting gluc 136 and HbA1c 6.7% at diagnosis.   . Diverticulosis   . Dyslipidemia   . GERD (gastroesophageal reflux disease)    occ  . Hx of adenomatous colonic polyps   . Hyperlipidemia   . Hypertension   . Internal hemorrhoids   . Obesity, Class II, BMI 35-39.9     Past Surgical History:  Procedure Laterality Date  . CARDIOVASCULAR STRESS TEST  02/2013   LexiScan/low-level exercise-Myoview (02/2013): No ischemia, EF 53%, occ symptomatic PVCs.   . COLONOSCOPY    . COLONOSCOPY W/ POLYPECTOMY  2005;08/21/10;11/29/15   adenomatous; also mild diverticulosis; recall 2017.  iFOB neg 09/2011.  11/2015: tubular adenoma x 1. Recall 5 yrs (11/2020)  . POLYPECTOMY    . remvoal sebaceous cyst  15-20 years ago  . TONSILLECTOMY      Outpatient Medications Prior to Visit  Medication Sig Dispense Refill  . aspirin 81 MG tablet Take 81 mg by mouth daily.      Marland Kitchen atorvastatin (LIPITOR) 40 MG tablet TAKE 1 TABLET (40 MG TOTAL) BY MOUTH DAILY. 30 tablet 11  . diclofenac (VOLTAREN) 75 MG EC tablet TAKE 1 TABLET (75 MG TOTAL) BY MOUTH 2 (TWO) TIMES DAILY AS NEEDED 60 tablet 5  . FREESTYLE LITE test strip USE AS INSTRUCTED 50 each 3  . hydrochlorothiazide (HYDRODIURIL) 25 MG tablet TAKE 1 TABLET (25 MG TOTAL) BY MOUTH DAILY. 30 tablet 11  . potassium chloride SA (K-DUR,KLOR-CON) 20 MEQ tablet 2 tabs po qd 60 tablet 3   Facility-Administered Medications Prior to Visit  Medication Dose Route Frequency Provider Last Rate Last Dose  . 0.9 %  sodium chloride infusion  500 mL Intravenous Continuous Irene Shipper, MD        No Known Allergies  ROS As per HPI  PE: Blood pressure  126/78, pulse 76, temperature 97.7 F (36.5 C), temperature source Oral, resp. rate 18, height 5\' 6"  (1.676 m), weight 206 lb 12.8 oz (93.8 kg), SpO2 97 %. Gen: Alert, well appearing.  Patient is oriented to person, place, time, and situation. AFFECT: pleasant, lucid thought and speech. CV: RRR, no m/r/g.   LUNGS: CTA bilat, nonlabored resps, good aeration in all lung fields. EXT: no clubbing, cyanosis, or edema.  R KNEE: no bruising or swelling.  No tenderness.  ROM intact.  No instability.  LABS:  Lab Results  Component Value Date   TSH 3.69 03/10/2014   Lab Results  Component Value Date   WBC 7.2 12/24/2014   HGB 15.0 12/24/2014   HCT 44.8 12/24/2014   MCV 92.8 12/24/2014   PLT 215.0 12/24/2014   Lab Results  Component Value Date   CREATININE 1.00 07/12/2016    BUN 27 (H) 07/12/2016   NA 140 07/12/2016   K 3.5 07/12/2016   CL 101 07/12/2016   CO2 30 07/12/2016   Lab Results  Component Value Date   ALT 26 11/09/2015   AST 28 11/09/2015   ALKPHOS 48 11/09/2015   BILITOT 0.9 11/09/2015   Lab Results  Component Value Date   CHOL 102 11/09/2015   Lab Results  Component Value Date   HDL 36.50 (L) 11/09/2015   Lab Results  Component Value Date   LDLCALC 45 11/09/2015   Lab Results  Component Value Date   TRIG 104.0 11/09/2015   Lab Results  Component Value Date   CHOLHDL 3 11/09/2015   Lab Results  Component Value Date   PSA 1.93 03/06/2016   PSA 0.77 03/10/2014   PSA 0.53 01/06/2013   Lab Results  Component Value Date   HGBA1C 7.1 (H) 07/12/2016    IMPRESSION AND PLAN:  1) DM 2, hx of good control. Home monitoring pretty good. HbA1c check today. Feet exam normal today. Has eye exam later today.  2) HTN: The current medical regimen is effective;  continue present plan and medications. Lytes/cr today.  3) HLD: tolerating statin. FLP today.  AST/ALT today.  4) CRI, GFR @60  ml/min. Minimizes NSAIDs, pays close attention to hydration. Lytes/cr check today.  5) Skin ca screening: pt asked for referral to dermatologist today so I ordered this today.  6) Right knee contusion: patella vs patellar tendon---resolving appropriately.  An After Visit Summary was printed and given to the patient.  FOLLOW UP: Return in about 3 months (around 03/02/2017) for routine chronic illness f/u.  Signed:  Crissie Sickles, MD           11/30/2016

## 2016-11-30 NOTE — Progress Notes (Signed)
Subjective:   Michael Hodge is a 66 y.o. male who presents for an Initial Medicare Annual Wellness Visit.  Review of Systems  No ROS.  Medicare Wellness Visit. Additional risk factors are reflected in the social history.  Cardiac Risk Factors include: advanced age (>21men, >75 women);diabetes mellitus;dyslipidemia;family history of premature cardiovascular disease;hypertension;male gender;obesity (BMI >30kg/m2)   Sleep patterns: Sleeps 7-8 hours.  Home Safety/Smoke Alarms: Feels safe in home. Smoke alarms in place.  Living environment; residence and Firearm Safety: Lives with wife in 3 story home.  Seat Belt Safety/Bike Helmet: Wears seat belt.    Male:   CCS-Colonoscopy 11/29/2015, polyps. Recall 5 years.      PSA-  Lab Results  Component Value Date   PSA 1.93 03/06/2016   PSA 0.77 03/10/2014   PSA 0.53 01/06/2013      Objective:    Today's Vitals   11/30/16 0834  BP: 126/78  Pulse: 76  Resp: 18  Temp: 97.7 F (36.5 C)  TempSrc: Oral  SpO2: 97%  Weight: 206 lb 12.8 oz (93.8 kg)  Height: 5\' 6"  (1.676 m)   Body mass index is 33.38 kg/m.  Current Medications (verified) Outpatient Encounter Prescriptions as of 11/30/2016  Medication Sig  . aspirin 81 MG tablet Take 81 mg by mouth daily.    Marland Kitchen atorvastatin (LIPITOR) 40 MG tablet TAKE 1 TABLET (40 MG TOTAL) BY MOUTH DAILY.  Marland Kitchen diclofenac (VOLTAREN) 75 MG EC tablet TAKE 1 TABLET (75 MG TOTAL) BY MOUTH 2 (TWO) TIMES DAILY AS NEEDED  . FREESTYLE LITE test strip USE AS INSTRUCTED  . hydrochlorothiazide (HYDRODIURIL) 25 MG tablet TAKE 1 TABLET (25 MG TOTAL) BY MOUTH DAILY.  Marland Kitchen potassium chloride SA (K-DUR,KLOR-CON) 20 MEQ tablet 2 tabs po qd   Facility-Administered Encounter Medications as of 11/30/2016  Medication  . 0.9 %  sodium chloride infusion    Allergies (verified) Patient has no known allergies.   History: Past Medical History:  Diagnosis Date  . Allergy   . Arthritis   . CAD (coronary artery disease)      CT demonstrated nonobstructive plaques (this test was prompted by borderline ETT.  Marland Kitchen Chronic renal insufficiency, stage III (moderate) (HCC)    Borderline stage II/III, CrCl @ 60 ml/min  . Diabetes mellitus without complication ([place]) dx'd 02/5398   Fasting gluc 136 and HbA1c 6.7% at diagnosis.   . Diverticulosis   . Dyslipidemia   . GERD (gastroesophageal reflux disease)    occ  . Hx of adenomatous colonic polyps   . Hyperlipidemia   . Hypertension   . Internal hemorrhoids   . Obesity, Class II, BMI 35-39.9    Past Surgical History:  Procedure Laterality Date  . CARDIOVASCULAR STRESS TEST  02/2013   LexiScan/low-level exercise-Myoview (02/2013): No ischemia, EF 53%, occ symptomatic PVCs.  . COLONOSCOPY    . COLONOSCOPY W/ POLYPECTOMY  2005;08/21/10;11/29/15   adenomatous; also mild diverticulosis; recall 2017.  iFOB neg 09/2011.  11/2015: tubular adenoma x 1. Recall 5 yrs (11/2020)  . POLYPECTOMY    . remvoal sebaceous cyst  15-20 years ago  . TONSILLECTOMY     Family History  Problem Relation Age of Onset  . Diabetes Father   . Stroke Father   . Heart attack Mother   . Stroke Other   . Colon cancer Neg Hx   . Colon polyps Neg Hx   . Rectal cancer Neg Hx   . Stomach cancer Neg Hx    Social History  Occupational History  . Not on file.   Social History Main Topics  . Smoking status: Never Smoker  . Smokeless tobacco: Never Used  . Alcohol use 1.8 oz/week    3 Shots of liquor per week     Comment: occ  . Drug use: No  . Sexual activity: Yes   Tobacco Counseling Counseling given: Not Answered   Activities of Daily Living In your present state of health, do you have any difficulty performing the following activities: 11/30/2016  Hearing? N  Vision? N  Difficulty concentrating or making decisions? N  Walking or climbing stairs? N  Dressing or bathing? N  Doing errands, shopping? N  Preparing Food and eating ? N  Using the Toilet? N  In the past six months,  have you accidently leaked urine? N  Do you have problems with loss of bowel control? N  Managing your Medications? N  Managing your Finances? N  Housekeeping or managing your Housekeeping? N  Some recent data might be hidden    Immunizations and Health Maintenance Immunization History  Administered Date(s) Administered  . DTaP 02/27/2007  . Hepatitis B 02/27/2007  . IPV 02/27/2007  . Influenza Split 01/23/2011, 11/14/2012  . Influenza, High Dose Seasonal PF 11/09/2015, 11/30/2016  . Influenza,inj,Quad PF,6+ Mos 12/25/2013  . Influenza-Unspecified 11/19/2014  . Pneumococcal Conjugate-13 11/14/2012  . Pneumococcal Polysaccharide-23 04/14/2015  . Td 01/20/2008  . Zoster 04/12/2009   Health Maintenance Due  Topic Date Due  . OPHTHALMOLOGY EXAM  11/03/2016    Patient Care Team: Tammi Sou, MD as PCP - General (Family Medicine) Marica Otter, [place] as Consulting Physician (Optometry) Irene Shipper, MD as Consulting Physician (Gastroenterology) Minus Breeding, MD as Consulting Physician (Cardiology)  Indicate any recent Medical Services you may have received from other than [facility] providers in the past year (date may be approximate).    Assessment:   This is a routine wellness examination for [name]. Physical assessment deferred to PCP.   Hearing/Vision screen Hearing Screening Comments: Able to hear conversational tones w/o difficulty. No issues reported.   Vision Screening Comments: Last exam 11/2015, appt today. Yearly, Dr. Marica Otter. Wears contacts.  Dietary issues and exercise activities discussed: Current Exercise Habits: Home exercise routine, Type of exercise: walking, Time (Minutes): 20, Frequency (Times/Week): 4, Weekly Exercise (Minutes/Week): 80, Exercise limited by: None identified   Diet (meal preparation, eat out, water intake, caffeinated beverages, dairy products, fruits and vegetables): Drinks water.  Breakfast: eggs; breakfast sandwich Lunch:  salad; sandwich Dinner: protein and vegetables    Goals    None     Depression Screen PHQ 2/9 Scores 11/30/2016 04/14/2015 12/24/2014  PHQ - 2 Score 0 0 0    Fall Risk Fall Risk  11/30/2016 04/14/2015 12/24/2014  Falls in the past year? Yes No No  Number falls in past yr: 1 - -  Injury with Fall? No - -  Follow up Falls prevention discussed - -    Cognitive Function:       Ad8 score reviewed for issues:  Issues making decisions: no  Less interest in hobbies / activities: no  Repeats questions, stories (family complaining): no  Trouble using ordinary gadgets (microwave, computer, phone): no  Forgets the month or year: no  Mismanaging finances: no  Remembering appts: no  Daily problems with thinking and/or memory: no Ad8 score is=0     Screening Tests Health Maintenance  Topic Date Due  . OPHTHALMOLOGY EXAM  11/03/2016  . HEMOGLOBIN A1C  01/12/2017  . URINE MICROALBUMIN  07/12/2017  . FOOT EXAM  11/30/2017  . TETANUS/TDAP  01/19/2018  . COLONOSCOPY  11/28/2020  . INFLUENZA VACCINE  Completed  . Hepatitis C Screening  Completed   Eye exam appointment today.      Plan:    Continue doing brain stimulating activities (puzzles, reading, adult coloring books, staying active) to keep memory sharp.   Bring a copy of your living will and/or healthcare power of attorney to your next office visit.  I have personally reviewed and noted the following in the patient's chart:   . Medical and social history . Use of alcohol, tobacco or illicit drugs  . Current medications and supplements . Functional ability and status . Nutritional status . Physical activity . Advanced directives . List of other physicians . Hospitalizations, surgeries, and ER visits in previous 12 months . Vitals . Screenings to include cognitive, depression, and falls . Referrals and appointments  In addition, I have reviewed and discussed with patient certain preventive protocols,  quality metrics, and best practice recommendations. A written personalized care plan for preventive services as well as general preventive health recommendations were provided to patient.     Gerilyn Nestle, RN   11/30/2016

## 2016-12-07 ENCOUNTER — Encounter: Payer: Self-pay | Admitting: Family Medicine

## 2016-12-31 ENCOUNTER — Ambulatory Visit (INDEPENDENT_AMBULATORY_CARE_PROVIDER_SITE_OTHER): Payer: Medicare Other | Admitting: Family Medicine

## 2016-12-31 ENCOUNTER — Encounter: Payer: Self-pay | Admitting: Family Medicine

## 2016-12-31 VITALS — BP 109/66 | HR 50 | Temp 98.2°F | Resp 16 | Ht 66.0 in | Wt 213.8 lb

## 2016-12-31 DIAGNOSIS — J209 Acute bronchitis, unspecified: Secondary | ICD-10-CM

## 2016-12-31 DIAGNOSIS — J069 Acute upper respiratory infection, unspecified: Secondary | ICD-10-CM | POA: Diagnosis not present

## 2016-12-31 MED ORDER — PREDNISONE 20 MG PO TABS: ORAL_TABLET | ORAL | 0 refills | Status: DC

## 2016-12-31 MED ORDER — AZITHROMYCIN 250 MG PO TABS: ORAL_TABLET | ORAL | 0 refills | Status: DC

## 2016-12-31 MED ORDER — HYDROCODONE-HOMATROPINE 5-1.5 MG/5ML PO SYRP: ORAL_SOLUTION | ORAL | 0 refills | Status: DC

## 2016-12-31 NOTE — Patient Instructions (Signed)
Get otc generic robitussin DM OR Mucinex DM and use as directed on the packaging for cough and congestion. Use otc generic saline nasal spray 2-3 times per day to irrigate/moisturize your nasal passages.   

## 2016-12-31 NOTE — Progress Notes (Signed)
OFFICE VISIT  12/31/2016   CC:  Chief Complaint  Patient presents with  . Cough    and congestion   HPI:    Patient is a 66 y.o.  male who presents for respiratory complaints. Onset about days 6 days ago, nasal congestion and "chest cough".  Minimally productive cough.  Cough much worse supine/trying to sleep.  Some PND and scratchy throat.  No fever.  Denies wheezing or SOB.  NO CP. No signif achiness.  Symptoms not seeming to get much better since the start of illness.  +Frontal sinus pressure.  No perinasal sinus pain, no upper teeth pain.  Some otc cold meds tried. No n/v/d or rash.  Past Medical History:  Diagnosis Date  . Allergy   . Arthritis   . CAD (coronary artery disease)    CT demonstrated nonobstructive plaques (this test was prompted by borderline ETT.  Marland Kitchen Chronic renal insufficiency, stage III (moderate) (HCC)    Borderline stage II/III, CrCl @ 60 ml/min  . Diabetes mellitus without complication ([place]) dx'd 08/3218   Fasting gluc 136 and HbA1c 6.7% at diagnosis.   . Diverticulosis   . Dyslipidemia   . GERD (gastroesophageal reflux disease)    occ  . Hx of adenomatous colonic polyps   . Hyperlipidemia   . Hypertension   . Internal hemorrhoids   . Obesity, Class II, BMI 35-39.9     Past Surgical History:  Procedure Laterality Date  . CARDIOVASCULAR STRESS TEST  02/2013   LexiScan/low-level exercise-Myoview (02/2013): No ischemia, EF 53%, occ symptomatic PVCs.  . COLONOSCOPY    . COLONOSCOPY W/ POLYPECTOMY  2005;08/21/10;11/29/15   adenomatous; also mild diverticulosis; recall 2017.  iFOB neg 09/2011.  11/2015: tubular adenoma x 1. Recall 5 yrs (11/2020)  . POLYPECTOMY    . remvoal sebaceous cyst  15-20 years ago  . TONSILLECTOMY      Outpatient Medications Prior to Visit  Medication Sig Dispense Refill  . aspirin 81 MG tablet Take 81 mg by mouth daily.      Marland Kitchen atorvastatin (LIPITOR) 40 MG tablet TAKE 1 TABLET (40 MG TOTAL) BY MOUTH DAILY. 30 tablet 11  .  diclofenac (VOLTAREN) 75 MG EC tablet TAKE 1 TABLET (75 MG TOTAL) BY MOUTH 2 (TWO) TIMES DAILY AS NEEDED 60 tablet 5  . FREESTYLE LITE test strip USE AS INSTRUCTED 50 each 3  . hydrochlorothiazide (HYDRODIURIL) 25 MG tablet TAKE 1 TABLET (25 MG TOTAL) BY MOUTH DAILY. 30 tablet 11  . potassium chloride SA (K-DUR,KLOR-CON) 20 MEQ tablet 2 tabs po qd 60 tablet 3  . 0.9 %  sodium chloride infusion      No facility-administered medications prior to visit.     No Known Allergies  ROS As per HPI  PE: Blood pressure 109/66, pulse (!) 50, temperature 98.2 F (36.8 C), temperature source Oral, resp. rate 16, height 5\' 6"  (1.676 m), weight 213 lb 12 oz (97 kg), SpO2 98 %. VS: noted--normal. Gen: alert, NAD, NONTOXIC APPEARING. HEENT: eyes without injection, drainage, or swelling.  Ears: EACs clear, TMs with normal light reflex and landmarks.  Nose: Clear rhinorrhea, with some dried, crusty exudate adherent to mildly injected mucosa.  No purulent d/c.  Mild paranasal sinus pressure with palpation, also frontal sinus pressure with palpation.  No facial swelling.  Throat and mouth without focal lesion.  No pharyngial swelling, erythema, or exudate.   Neck: supple, no LAD.   LUNGS: CTA bilat, nonlabored resps.   CV: RRR, no m/r/g.  EXT: no c/c/e SKIN: no rash   LABS:    Chemistry      Component Value Date/Time   NA 138 11/30/2016 0949   K 3.4 (L) 11/30/2016 0949   CL 100 11/30/2016 0949   CO2 30 11/30/2016 0949   BUN 23 11/30/2016 0949   CREATININE 0.95 11/30/2016 0949      Component Value Date/Time   CALCIUM 9.9 11/30/2016 0949   ALKPHOS 54 11/30/2016 0949   AST 24 11/30/2016 0949   ALT 27 11/30/2016 0949   BILITOT 1.0 11/30/2016 0949      IMPRESSION AND PLAN:  URI with cough, moderate component of acute bronchitis suspected. Not improving. Will treat with Z pack, prednisone 40mg  qd x 5d. Daytime symptomatic care: Get otc generic robitussin DM OR Mucinex DM and use as directed on  the packaging for cough and congestion. Use otc generic saline nasal spray 2-3 times per day to irrigate/moisturize your nasal passages. Hycodan syrup, 1-2 tsp qhs for night-time cough.  An After Visit Summary was printed and given to the patient.  FOLLOW UP: Return if symptoms worsen or fail to improve.  Signed:  Crissie Sickles, MD           12/31/2016

## 2017-01-04 ENCOUNTER — Other Ambulatory Visit: Payer: Self-pay | Admitting: Family Medicine

## 2017-01-04 NOTE — Telephone Encounter (Signed)
Michael Hodge  RF request for diclofenac LOV: 11/30/16 Next ov: 03/08/17 Last written: 06/07/16 #60 w/ 5RF  Please advise.Thanks.

## 2017-01-07 ENCOUNTER — Other Ambulatory Visit: Payer: Self-pay | Admitting: Family Medicine

## 2017-01-28 ENCOUNTER — Other Ambulatory Visit: Payer: Self-pay | Admitting: Family Medicine

## 2017-03-08 ENCOUNTER — Ambulatory Visit: Payer: Medicare Other | Admitting: Family Medicine

## 2017-03-28 ENCOUNTER — Encounter: Payer: Self-pay | Admitting: Family Medicine

## 2017-03-28 NOTE — Progress Notes (Signed)
OFFICE VISIT  03/29/2017   CC:  Chief Complaint  Patient presents with  . Diabetes    follow up   HPI:    Patient is a 67 y.o. Caucasian male who presents for f/u DM 2, HTN, HLD.  DM: home fastings 130-140 avg, same 2H PP.    HTN: home bp's normal (120s/70s).   Not taking potassium regularly.    HLD: tolerating atova well.    About 2 weeks ago he had a couple days of feeling a sense of disequilibrium, particularly upon rising from sitting position. No presyncope.  No palpitations, no CP, no diaphoresis.  He had not been ill recently or dehydrated.   The sensation resolved pretty quick and he was otherwise fine when sitting still. BP and glucoses were normal.  Has intermittent sensation in feet "like they are more tired".  No tingling or numbness.   No pain or weakness in calves when he walks.   Past Medical History:  Diagnosis Date  . Allergy   . Arthritis   . CAD (coronary artery disease)    CT demonstrated nonobstructive plaques (this test was prompted by borderline ETT.  . Diabetes mellitus without complication ([place]) dx'd 03/2977   Fasting gluc 136 and HbA1c 6.7% at diagnosis.   . Diverticulosis   . GERD (gastroesophageal reflux disease)    occ  . Hx of adenomatous colonic polyps   . Hyperlipidemia   . Hypertension   . Internal hemorrhoids   . Obesity, Class II, BMI 35-39.9     Past Surgical History:  Procedure Laterality Date  . CARDIOVASCULAR STRESS TEST  02/2013   LexiScan/low-level exercise-Myoview (02/2013): No ischemia, EF 53%, occ symptomatic PVCs.  . COLONOSCOPY    . COLONOSCOPY W/ POLYPECTOMY  2005;08/21/10;11/29/15   adenomatous; also mild diverticulosis; recall 2017.  iFOB neg 09/2011.  11/2015: tubular adenoma x 1. Recall 5 yrs (11/2020)  . POLYPECTOMY    . remvoal sebaceous cyst  15-20 years ago  . TONSILLECTOMY      Outpatient Medications Prior to Visit  Medication Sig Dispense Refill  . aspirin 81 MG tablet Take 81 mg by mouth daily.      Marland Kitchen  atorvastatin (LIPITOR) 40 MG tablet TAKE 1 TABLET (40 MG TOTAL) BY MOUTH DAILY. 90 tablet 1  . diclofenac (VOLTAREN) 75 MG EC tablet TAKE ONE TABLET BY MOUTH TWICE A DAY AS NEEDED 60 tablet 6  . FREESTYLE LITE test strip USE AS INSTRUCTED 50 each 3  . hydrochlorothiazide (HYDRODIURIL) 25 MG tablet TAKE ONE TABLET BY MOUTH DAILY 90 tablet 1  . potassium chloride SA (K-DUR,KLOR-CON) 20 MEQ tablet 2 tabs po qd 60 tablet 3  . azithromycin (ZITHROMAX) 250 MG tablet 2 tabs po qd x 1d, then 1 tab po qd x 4d (Patient not taking: Reported on 03/29/2017) 6 tablet 0  . HYDROcodone-homatropine (HYCODAN) 5-1.5 MG/5ML syrup 1-2 tsp po qhs prn cough (Patient not taking: Reported on 03/29/2017) 120 mL 0  . predniSONE (DELTASONE) 20 MG tablet 2 tabs po qd x 5d (Patient not taking: Reported on 03/29/2017) 10 tablet 0   No facility-administered medications prior to visit.     No Known Allergies  ROS As per HPI  PE: Blood pressure 121/81, pulse (!) 50, temperature 97.6 F (36.4 C), temperature source Oral, resp. rate 16, weight 205 lb (93 kg), SpO2 97 %. Gen: Alert, well appearing.  Patient is oriented to person, place, time, and situation. AFFECT: pleasant, lucid thought and speech. CV: RRR, no  m/r/g.   LUNGS: CTA bilat, nonlabored resps, good aeration in all lung fields. EXT: no clubbing, cyanosis, or edema.    LABS:  Lab Results  Component Value Date   TSH 3.69 03/10/2014   Lab Results  Component Value Date   WBC 7.2 12/24/2014   HGB 15.0 12/24/2014   HCT 44.8 12/24/2014   MCV 92.8 12/24/2014   PLT 215.0 12/24/2014   Lab Results  Component Value Date   CREATININE 0.95 11/30/2016   BUN 23 11/30/2016   NA 138 11/30/2016   K 3.4 (L) 11/30/2016   CL 100 11/30/2016   CO2 30 11/30/2016   Lab Results  Component Value Date   ALT 27 11/30/2016   AST 24 11/30/2016   ALKPHOS 54 11/30/2016   BILITOT 1.0 11/30/2016   Lab Results  Component Value Date   CHOL 85 11/30/2016   Lab Results   Component Value Date   HDL 35.90 (L) 11/30/2016   Lab Results  Component Value Date   LDLCALC 30 11/30/2016   Lab Results  Component Value Date   TRIG 95.0 11/30/2016   Lab Results  Component Value Date   CHOLHDL 2 11/30/2016   Lab Results  Component Value Date   PSA 1.93 03/06/2016   PSA 0.77 03/10/2014   PSA 0.53 01/06/2013   Lab Results  Component Value Date   HGBA1C 6.8 03/29/2017   POC HbA1c today= 6.8%  IMPRESSION AND PLAN:  1) DM 2: A1c good today at 6.8%. Continue diet/exercise.  2) HTN: The current medical regimen is effective;  continue present plan and medications. Lytes/cr good 11/30/16.  Plan repeat BMET in 3 mo.  3) HLD: tolerating statin.  Lipid panel excellent 11/2016. Plan repeat at next f/u in 3 mo.  4) Orthostatic dizziness: mild and only for 2 days.  He has chronic sinus brady on exam and EKGs. No additional testing.  Pt to call or return if these sx's become more severe, frequent, or are associated with any new sx's.  Encouraged good hydration practices, monitor bp/hr periodically.  An After Visit Summary was printed and given to the patient.  FOLLOW UP: Return in about 3 months (around 06/26/2017) for routine chronic illness f/u.  Signed:  Crissie Sickles, MD           03/29/2017

## 2017-03-29 ENCOUNTER — Ambulatory Visit (INDEPENDENT_AMBULATORY_CARE_PROVIDER_SITE_OTHER): Payer: Medicare Other | Admitting: Family Medicine

## 2017-03-29 ENCOUNTER — Encounter: Payer: Self-pay | Admitting: Family Medicine

## 2017-03-29 VITALS — BP 121/81 | HR 50 | Temp 97.6°F | Resp 16 | Wt 205.0 lb

## 2017-03-29 DIAGNOSIS — E7800 Pure hypercholesterolemia, unspecified: Secondary | ICD-10-CM

## 2017-03-29 DIAGNOSIS — R42 Dizziness and giddiness: Secondary | ICD-10-CM | POA: Diagnosis not present

## 2017-03-29 DIAGNOSIS — E119 Type 2 diabetes mellitus without complications: Secondary | ICD-10-CM

## 2017-03-29 DIAGNOSIS — I10 Essential (primary) hypertension: Secondary | ICD-10-CM

## 2017-03-29 LAB — POCT GLYCOSYLATED HEMOGLOBIN (HGB A1C): HEMOGLOBIN A1C: 6.8

## 2017-06-27 ENCOUNTER — Ambulatory Visit: Payer: Medicare Other | Admitting: Family Medicine

## 2017-07-05 ENCOUNTER — Other Ambulatory Visit: Payer: Self-pay | Admitting: Family Medicine

## 2017-07-31 ENCOUNTER — Encounter: Payer: Self-pay | Admitting: Family Medicine

## 2017-07-31 ENCOUNTER — Ambulatory Visit (INDEPENDENT_AMBULATORY_CARE_PROVIDER_SITE_OTHER): Payer: Medicare Other | Admitting: Family Medicine

## 2017-07-31 VITALS — BP 110/66 | HR 53 | Temp 98.1°F | Resp 16 | Ht 65.5 in | Wt 201.5 lb

## 2017-07-31 DIAGNOSIS — E669 Obesity, unspecified: Secondary | ICD-10-CM

## 2017-07-31 DIAGNOSIS — Z125 Encounter for screening for malignant neoplasm of prostate: Secondary | ICD-10-CM

## 2017-07-31 DIAGNOSIS — E7800 Pure hypercholesterolemia, unspecified: Secondary | ICD-10-CM | POA: Diagnosis not present

## 2017-07-31 DIAGNOSIS — E119 Type 2 diabetes mellitus without complications: Secondary | ICD-10-CM

## 2017-07-31 DIAGNOSIS — I10 Essential (primary) hypertension: Secondary | ICD-10-CM

## 2017-07-31 LAB — BASIC METABOLIC PANEL
BUN: 23 mg/dL (ref 6–23)
CO2: 31 mEq/L (ref 19–32)
Calcium: 9.9 mg/dL (ref 8.4–10.5)
Chloride: 100 mEq/L (ref 96–112)
Creatinine, Ser: 0.92 mg/dL (ref 0.40–1.50)
GFR: 86.8 mL/min (ref >60.00)
GLUCOSE: 126 mg/dL — AB (ref 70–99)
POTASSIUM: 3.1 meq/L — AB (ref 3.5–5.1)
SODIUM: 140 meq/L (ref 135–145)

## 2017-07-31 LAB — CBC WITH DIFFERENTIAL/PLATELET
BASOS ABS: 0.1 10*3/uL (ref 0.0–0.1)
Basophils Relative: 0.8 % (ref 0.0–3.0)
Eosinophils Absolute: 0.1 10*3/uL (ref 0.0–0.7)
Eosinophils Relative: 1.6 % (ref 0.0–5.0)
HCT: 42.2 % (ref 39.0–52.0)
HEMOGLOBIN: 14.5 g/dL (ref 13.0–17.0)
Lymphocytes Relative: 17.5 % (ref 12.0–46.0)
Lymphs Abs: 1.3 10*3/uL (ref 0.7–4.0)
MCHC: 34.5 g/dL (ref 30.0–36.0)
MCV: 90.4 fl (ref 78.0–100.0)
MONOS PCT: 10.5 % (ref 3.0–12.0)
Monocytes Absolute: 0.8 10*3/uL (ref 0.1–1.0)
Neutro Abs: 5 10*3/uL (ref 1.4–7.7)
Neutrophils Relative %: 69.6 % (ref 43.0–77.0)
Platelets: 184 10*3/uL (ref 150.0–400.0)
RBC: 4.67 Mil/uL (ref 4.22–5.81)
RDW: 13.8 % (ref 11.5–15.5)
WBC: 7.2 10*3/uL (ref 4.0–10.5)

## 2017-07-31 LAB — PSA, MEDICARE: PSA: 0.8 ng/ml (ref 0.10–4.00)

## 2017-07-31 LAB — MICROALBUMIN / CREATININE URINE RATIO
Creatinine,U: 136.1 mg/dL
Microalb Creat Ratio: 0.5 mg/g (ref 0.0–30.0)

## 2017-07-31 LAB — TSH: TSH: 3.12 u[IU]/mL (ref 0.35–4.50)

## 2017-07-31 LAB — HEMOGLOBIN A1C: HEMOGLOBIN A1C: 7.1 % — AB (ref 4.6–6.5)

## 2017-07-31 NOTE — Progress Notes (Signed)
Office Note 07/31/2017  CC:  Chief Complaint  Patient presents with  . Annual Exam    Pt is fasting.     HPI:  Michael Hodge is a 67 y.o. White male who is here for 6 mo f/u chronic illnesses (DM, HTN, HLD).  Started to notice a "floater" in R eye, started yest morning. Cleaned contact, made sure nothing was on surface of eye--nothing abnormal found. Floater in R eye moving around still today.  DM: glucoses 120-160 range. HTN: consistently <130/80. HLD: tolerating statin well. Diet: generally pretty cognizant of avoiding high carb and high fat foods.  He is active and does some walking for exercise, plays golf some.   Past Medical History:  Diagnosis Date  . Allergy   . Arthritis   . CAD (coronary artery disease)    CT demonstrated nonobstructive plaques (this test was prompted by borderline ETT.  . Diabetes mellitus without complication ([place]) dx'd 03/7780   Fasting gluc 136 and HbA1c 6.7% at diagnosis.   . Diverticulosis   . GERD (gastroesophageal reflux disease)    occ  . Hx of adenomatous colonic polyps   . Hyperlipidemia   . Hypertension   . Internal hemorrhoids   . Obesity, Class II, BMI 35-39.9     Past Surgical History:  Procedure Laterality Date  . CARDIOVASCULAR STRESS TEST  02/2013   LexiScan/low-level exercise-Myoview (02/2013): No ischemia, EF 53%, occ symptomatic PVCs.  . COLONOSCOPY    . COLONOSCOPY W/ POLYPECTOMY  2005;08/21/10;11/29/15   adenomatous; also mild diverticulosis; recall 2017.  iFOB neg 09/2011.  11/2015: tubular adenoma x 1. Recall 5 yrs (11/2020)  . POLYPECTOMY    . remvoal sebaceous cyst  15-20 years ago  . TONSILLECTOMY      Family History  Problem Relation Age of Onset  . Diabetes Father   . Stroke Father   . Heart attack Mother   . Stroke Other   . Colon cancer Neg Hx   . Colon polyps Neg Hx   . Rectal cancer Neg Hx   . Stomach cancer Neg Hx     Social History   Socioeconomic History  . Marital status: Married     Spouse name: Not on file  . Number of children: Not on file  . Years of education: Not on file  . Highest education level: Not on file  Occupational History  . Not on file  Social Needs  . Financial resource strain: Not on file  . Food insecurity:    Worry: Not on file    Inability: Not on file  . Transportation needs:    Medical: Not on file    Non-medical: Not on file  Tobacco Use  . Smoking status: Never Smoker  . Smokeless tobacco: Never Used  Substance and Sexual Activity  . Alcohol use: Yes    Alcohol/week: 1.8 oz    Types: 3 Shots of liquor per week    Comment: occ  . Drug use: No  . Sexual activity: Yes  Lifestyle  . Physical activity:    Days per week: Not on file    Minutes per session: Not on file  . Stress: Not on file  Relationships  . Social connections:    Talks on phone: Not on file    Gets together: Not on file    Attends religious service: Not on file    Active member of club or organization: Not on file    Attends meetings of clubs or organizations:  Not on file    Relationship status: Not on file  . Intimate partner violence:    Fear of current or ex partner: Not on file    Emotionally abused: Not on file    Physically abused: Not on file    Forced sexual activity: Not on file  Other Topics Concern  . Not on file  Social History Narrative   Married x 40 yrs.   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   No T/A/Ds.    Outpatient Medications Prior to Visit  Medication Sig Dispense Refill  . aspirin 81 MG tablet Take 81 mg by mouth daily.      Marland Kitchen atorvastatin (LIPITOR) 40 MG tablet TAKE 1 TABLET (40 MG TOTAL) BY MOUTH DAILY. 90 tablet 1  . diclofenac (VOLTAREN) 75 MG EC tablet TAKE ONE TABLET BY MOUTH TWICE A DAY AS NEEDED 60 tablet 6  . FREESTYLE LITE test strip USE AS INSTRUCTED 50 each 3  . hydrochlorothiazide (HYDRODIURIL) 25 MG tablet TAKE ONE TABLET BY MOUTH DAILY 90 tablet 1  . potassium chloride SA (K-DUR,KLOR-CON)  20 MEQ tablet 2 tabs po qd 60 tablet 3  . azithromycin (ZITHROMAX) 250 MG tablet 2 tabs po qd x 1d, then 1 tab po qd x 4d (Patient not taking: Reported on 03/29/2017) 6 tablet 0  . HYDROcodone-homatropine (HYCODAN) 5-1.5 MG/5ML syrup 1-2 tsp po qhs prn cough (Patient not taking: Reported on 03/29/2017) 120 mL 0  . predniSONE (DELTASONE) 20 MG tablet 2 tabs po qd x 5d (Patient not taking: Reported on 03/29/2017) 10 tablet 0   No facility-administered medications prior to visit.     No Known Allergies  ROS Review of Systems  Constitutional: Negative for appetite change, chills, fatigue and fever.  HENT: Positive for postnasal drip. Negative for congestion, dental problem, ear pain and sore throat.   Eyes: Negative for discharge, redness and visual disturbance.  Respiratory: Negative for cough, chest tightness, shortness of breath and wheezing.   Cardiovascular: Negative for chest pain, palpitations and leg swelling.  Gastrointestinal: Negative for abdominal pain, blood in stool, diarrhea, nausea and vomiting.  Genitourinary: Negative for difficulty urinating, dysuria, flank pain, frequency, hematuria and urgency.  Musculoskeletal: Negative for arthralgias, back pain, joint swelling, myalgias and neck stiffness.  Skin: Negative for pallor and rash.  Neurological: Negative for dizziness, speech difficulty, weakness and headaches.  Hematological: Negative for adenopathy. Does not bruise/bleed easily.  Psychiatric/Behavioral: Negative for confusion and sleep disturbance. The patient is not nervous/anxious.     PE; Blood pressure 110/66, pulse (!) 53, temperature 98.1 F (36.7 C), temperature source Oral, resp. rate 16, height 5' 5.5" (1.664 m), weight 201 lb 8 oz (91.4 kg), SpO2 95 %. Body mass index is 33.02 kg/m.  Gen: Alert, well appearing.  Patient is oriented to person, place, time, and situation. AFFECT: pleasant, lucid thought and speech. ENT: Ears: EACs clear, normal epithelium.  TMs  with good light reflex and landmarks bilaterally.  Eyes: no injection, icteris, swelling, or exudate.  EOMI, PERRLA. Nose: no drainage or turbinate edema/swelling.  No injection or focal lesion.  Mouth: lips without lesion/swelling.  Oral mucosa pink and moist.  Dentition intact and without obvious caries or gingival swelling.  Oropharynx without erythema, exudate, or swelling.  Neck: supple/nontender.  No LAD, mass, or TM.  Carotid pulses 2+ bilaterally, without bruits. CV: RRR, no m/r/g.   LUNGS: CTA bilat, nonlabored resps, good aeration in all lung fields. ABD: soft, NT,  ND, BS normal.  No hepatospenomegaly or mass.  No bruits. EXT: no clubbing, cyanosis, or edema.  Musculoskeletal: no joint swelling, erythema, warmth, or tenderness.  ROM of all joints intact. Skin - no sores or suspicious lesions or rashes or color changes Rectal exam: negative without mass, lesions or tenderness, PROSTATE EXAM: smooth and symmetric without nodules or tenderness.   Pertinent labs:  Lab Results  Component Value Date   TSH 3.69 03/10/2014   Lab Results  Component Value Date   WBC 7.2 12/24/2014   HGB 15.0 12/24/2014   HCT 44.8 12/24/2014   MCV 92.8 12/24/2014   PLT 215.0 12/24/2014   Lab Results  Component Value Date   CREATININE 0.95 11/30/2016   BUN 23 11/30/2016   NA 138 11/30/2016   K 3.4 (L) 11/30/2016   CL 100 11/30/2016   CO2 30 11/30/2016   Lab Results  Component Value Date   ALT 27 11/30/2016   AST 24 11/30/2016   ALKPHOS 54 11/30/2016   BILITOT 1.0 11/30/2016   Lab Results  Component Value Date   CHOL 85 11/30/2016   Lab Results  Component Value Date   HDL 35.90 (L) 11/30/2016   Lab Results  Component Value Date   LDLCALC 30 11/30/2016   Lab Results  Component Value Date   TRIG 95.0 11/30/2016   Lab Results  Component Value Date   CHOLHDL 2 11/30/2016   Lab Results  Component Value Date   PSA 1.93 03/06/2016   PSA 0.77 03/10/2014   PSA 0.53 01/06/2013    Lab Results  Component Value Date   HGBA1C 6.8 03/29/2017    ASSESSMENT AND PLAN:   1) DM 2: good control. HbA1c and urine microalb/cr today.  2) HTN: The current medical regimen is effective;  continue present plan and medications. Lytes/cr today.  3) HLD: tolerating statin.  FLP today.  Hepatic panel normal 11/2016.  4) Preventative health: Vaccines: UTD.  Shingrix x 2 done as well. Prostate ca screen: done today; DRE normal.  PSA drawn. Colon ca screening: next colonoscopy due 2022.  An After Visit Summary was printed and given to the patient.  FOLLOW UP:  Return in about 6 months (around 01/30/2018) for routine chronic illness f/u.  Signed:  Crissie Sickles, MD           07/31/2017

## 2017-08-01 DIAGNOSIS — C4442 Squamous cell carcinoma of skin of scalp and neck: Secondary | ICD-10-CM | POA: Diagnosis not present

## 2017-08-01 DIAGNOSIS — H43391 Other vitreous opacities, right eye: Secondary | ICD-10-CM | POA: Diagnosis not present

## 2017-08-01 DIAGNOSIS — L918 Other hypertrophic disorders of the skin: Secondary | ICD-10-CM | POA: Diagnosis not present

## 2017-08-01 DIAGNOSIS — L821 Other seborrheic keratosis: Secondary | ICD-10-CM | POA: Diagnosis not present

## 2017-08-01 DIAGNOSIS — D485 Neoplasm of uncertain behavior of skin: Secondary | ICD-10-CM | POA: Diagnosis not present

## 2017-08-01 DIAGNOSIS — L570 Actinic keratosis: Secondary | ICD-10-CM | POA: Diagnosis not present

## 2017-08-01 DIAGNOSIS — C44519 Basal cell carcinoma of skin of other part of trunk: Secondary | ICD-10-CM | POA: Diagnosis not present

## 2017-08-02 ENCOUNTER — Other Ambulatory Visit: Payer: Self-pay | Admitting: *Deleted

## 2017-08-02 DIAGNOSIS — E876 Hypokalemia: Secondary | ICD-10-CM

## 2017-08-02 MED ORDER — POTASSIUM CHLORIDE CRYS ER 20 MEQ PO TBCR: 20.0000 meq | EXTENDED_RELEASE_TABLET | Freq: Every day | ORAL | 0 refills | Status: DC

## 2017-08-06 ENCOUNTER — Telehealth: Payer: Self-pay | Admitting: *Deleted

## 2017-08-06 MED ORDER — AZITHROMYCIN 250 MG PO TABS: ORAL_TABLET | ORAL | 0 refills | Status: DC

## 2017-08-06 MED ORDER — HYDROCHLOROTHIAZIDE 12.5 MG PO CAPS: ORAL_CAPSULE | ORAL | 0 refills | Status: DC

## 2017-08-06 NOTE — Telephone Encounter (Signed)
OK, will try hctz 12.5, 2 qd.--eRx sent.

## 2017-08-06 NOTE — Telephone Encounter (Signed)
Copied from [place] [id number]. Topic: Inquiry >> Aug 06, 2017 10:53 AM Pricilla Handler wrote: Reason for CRM: Patient wants to know if Dr. Anitra Lauth would call in a medication for patient's sinus infection/cold symptoms. Patient states that he saw Dr. Anitra Lauth last Wednesday, and his cold symptoms have gotten worse. Please call the patient today.    Thank You!!!

## 2017-08-06 NOTE — Telephone Encounter (Signed)
Pt called, stated that he was out of town last week and left his bottle of hctz. He stated that it will be a few weeks before he can go back to get it. He wants to know if we cans send in a short supply to get him by until he can get the bottle that he left.   I advised pt that he may have to pay out of pocket for this medication since it would be considered an early refill. Pt then asked if we could maybe send Rx for 12.5mg  take 2 tab daily, to see if insurance would cover it that way.   Please advise. Thanks.

## 2017-08-06 NOTE — Telephone Encounter (Signed)
1) SW pt and he stated that he spoke briefly with Dr. Anitra Lauth at his lov about having a scratchy throat and was hoping he would avoid getting sicker. Pt stated that Friday he started to feel worse, nasal/chest congestion, cough (mainly at night), sore throat, drainage, sinus pressure, no fever/chills/sweats.   Pt was advised that he may need to be seen in office, he voiced understanding.

## 2017-08-06 NOTE — Telephone Encounter (Signed)
Pls notify pt that his symptoms are still consistent with a cold virus, so I would recommend he take otc saline nasal spray 2-3 times per day as well as mucinex DM for cough. I eRx'd a Z pack for him to fill IF he does not show signs of improvement in 2 days. If not signif improved in 5-7d, f/u in office.-thx

## 2017-08-06 NOTE — Telephone Encounter (Signed)
Pt advised and voiced understanding.   

## 2017-08-06 NOTE — Telephone Encounter (Signed)
Left detailed message on cell vm, okay per DPR.  

## 2017-08-20 DIAGNOSIS — Z85828 Personal history of other malignant neoplasm of skin: Secondary | ICD-10-CM | POA: Diagnosis not present

## 2017-08-20 DIAGNOSIS — C4442 Squamous cell carcinoma of skin of scalp and neck: Secondary | ICD-10-CM | POA: Diagnosis not present

## 2017-08-29 ENCOUNTER — Other Ambulatory Visit: Payer: Self-pay | Admitting: Family Medicine

## 2017-09-01 ENCOUNTER — Other Ambulatory Visit: Payer: Self-pay | Admitting: Family Medicine

## 2017-09-02 ENCOUNTER — Other Ambulatory Visit (INDEPENDENT_AMBULATORY_CARE_PROVIDER_SITE_OTHER): Payer: Medicare Other

## 2017-09-02 DIAGNOSIS — E876 Hypokalemia: Secondary | ICD-10-CM | POA: Diagnosis not present

## 2017-09-02 LAB — BASIC METABOLIC PANEL
BUN: 22 mg/dL (ref 6–23)
CALCIUM: 9.6 mg/dL (ref 8.4–10.5)
CO2: 29 mEq/L (ref 19–32)
CREATININE: 0.97 mg/dL (ref 0.40–1.50)
Chloride: 102 mEq/L (ref 96–112)
GFR: 81.63 mL/min (ref >60.00)
Glucose, Bld: 124 mg/dL — ABNORMAL HIGH (ref 70–99)
Potassium: 3.6 mEq/L (ref 3.5–5.1)
SODIUM: 139 meq/L (ref 135–145)

## 2017-09-05 ENCOUNTER — Encounter: Payer: Self-pay | Admitting: *Deleted

## 2017-09-05 MED ORDER — POTASSIUM CHLORIDE CRYS ER 20 MEQ PO TBCR: 20.0000 meq | EXTENDED_RELEASE_TABLET | Freq: Every day | ORAL | 1 refills | Status: DC

## 2017-09-09 LAB — CBC AND DIFFERENTIAL
HEMATOCRIT: 43 (ref 41–53)
HEMOGLOBIN: 14.3 (ref 13.5–17.5)
NEUTROS ABS: 3
PLATELETS: 181 (ref 150–399)
WBC: 6

## 2017-09-09 LAB — LIPID PANEL
CHOLESTEROL: 99 (ref 0–200)
HDL: 38 (ref 35–70)
LDL Cholesterol: 41
Triglycerides: 105 (ref 40–160)

## 2017-09-09 LAB — BASIC METABOLIC PANEL
BUN: 29 — AB (ref 4–21)
CREATININE: 0.9 (ref 0.6–1.3)
Glucose: 117
Potassium: 3.9 (ref 3.4–5.3)
Sodium: 141 (ref 137–147)

## 2017-09-09 LAB — HEPATIC FUNCTION PANEL
ALK PHOS: 73 (ref 25–125)
ALT: 41 — AB (ref 10–40)
AST: 43 — AB (ref 14–40)

## 2017-09-16 ENCOUNTER — Encounter: Payer: Self-pay | Admitting: Family Medicine

## 2017-11-27 ENCOUNTER — Other Ambulatory Visit: Payer: Self-pay | Admitting: Physician Assistant

## 2018-01-03 ENCOUNTER — Other Ambulatory Visit: Payer: Self-pay | Admitting: Family Medicine

## 2018-01-10 DIAGNOSIS — H5213 Myopia, bilateral: Secondary | ICD-10-CM | POA: Diagnosis not present

## 2018-01-10 DIAGNOSIS — E119 Type 2 diabetes mellitus without complications: Secondary | ICD-10-CM | POA: Diagnosis not present

## 2018-01-10 DIAGNOSIS — H52223 Regular astigmatism, bilateral: Secondary | ICD-10-CM | POA: Diagnosis not present

## 2018-01-10 DIAGNOSIS — H25813 Combined forms of age-related cataract, bilateral: Secondary | ICD-10-CM | POA: Diagnosis not present

## 2018-01-10 LAB — HM DIABETES EYE EXAM

## 2018-01-15 DIAGNOSIS — Z23 Encounter for immunization: Secondary | ICD-10-CM | POA: Diagnosis not present

## 2018-01-16 ENCOUNTER — Other Ambulatory Visit: Payer: Self-pay

## 2018-01-28 NOTE — Progress Notes (Signed)
OFFICE VISIT  01/29/2018   CC:  Chief Complaint  Patient presents with  . Follow-up    RCI, pt is fasting.     HPI:    Patient is a 67 y.o. Caucasian male who presents for 6 mo f/u HTN, HLD, DM 2--diet controlled.  DM 2: fasting and 2H PP glucoses normal at home. He did not make any signif changes in diet and exercise habits since last f/u visit. Feet: no burning, tingling, or numbness.  HTN: consistently 120s/70s with home monitoring.  HLD: lipids excellent 6 mo ago and 4 mo ago.  Tolerating statin.  ROS: no CP, no SOB, no wheezing, no cough, no dizziness, no HAs, no rashes, no melena/hematochezia.  No polyuria or polydipsia.  No myalgias or arthralgias.   Past Medical History:  Diagnosis Date  . Allergy   . Arthritis   . CAD (coronary artery disease)    CT demonstrated nonobstructive plaques (this test was prompted by borderline ETT.  . Diabetes mellitus without complication ([place]) dx'd 04/7340   Fasting gluc 136 and HbA1c 6.7% at diagnosis.   . Diverticulosis   . GERD (gastroesophageal reflux disease)    occ  . Hx of adenomatous colonic polyps   . Hyperlipidemia   . Hypertension   . Internal hemorrhoids   . Obesity, Class II, BMI 35-39.9     Past Surgical History:  Procedure Laterality Date  . CARDIOVASCULAR STRESS TEST  02/2013   LexiScan/low-level exercise-Myoview (02/2013): No ischemia, EF 53%, occ symptomatic PVCs.  . COLONOSCOPY    . COLONOSCOPY W/ POLYPECTOMY  2005;08/21/10;11/29/15   adenomatous; also mild diverticulosis; recall 2017.  iFOB neg 09/2011.  11/2015: tubular adenoma x 1. Recall 5 yrs (11/2020)  . POLYPECTOMY    . remvoal sebaceous cyst  15-20 years ago  . TONSILLECTOMY      Outpatient Medications Prior to Visit  Medication Sig Dispense Refill  . aspirin 81 MG tablet Take 81 mg by mouth daily.      Marland Kitchen atorvastatin (LIPITOR) 40 MG tablet TAKE ONE TABLET BY MOUTH DAILY 90 tablet 0  . diclofenac (VOLTAREN) 75 MG EC tablet TAKE ONE TABLET BY MOUTH  TWICE A DAY AS NEEDED 60 tablet 6  . FREESTYLE LITE test strip USE AS INSTRUCTED 50 each 3  . hydrochlorothiazide (HYDRODIURIL) 25 MG tablet TAKE ONE TABLET BY MOUTH DAILY 90 tablet 0  . potassium chloride SA (K-DUR,KLOR-CON) 20 MEQ tablet Take 1 tablet (20 mEq total) by mouth daily. 90 tablet 1  . azithromycin (ZITHROMAX) 250 MG tablet 2 tabs po qd x 1d, then 1 tab po qd x 4d (Patient not taking: Reported on 01/29/2018) 6 tablet 0  . hydrochlorothiazide (MICROZIDE) 12.5 MG capsule 2 caps po qd (Patient not taking: Reported on 01/29/2018) 60 capsule 0   No facility-administered medications prior to visit.     No Known Allergies  ROS As per HPI  PE: Blood pressure 130/76, pulse (!) 43, temperature 98.4 F (36.9 C), temperature source Oral, resp. rate 16, height 5' 5.5" (1.664 m), weight 202 lb 8 oz (91.9 kg), SpO2 100 %. Body mass index is 33.19 kg/m.  Gen: Alert, well appearing.  Patient is oriented to person, place, time, and situation. AFFECT: pleasant, lucid thought and speech. Foot exam - bilateral normal; no swelling, tenderness or skin or vascular lesions. Color and temperature is normal. Sensation is intact. Peripheral pulses are palpable. Toenails are normal.   LABS:  Lab Results  Component Value Date   TSH  3.12 07/31/2017   Lab Results  Component Value Date   WBC 6.0 09/09/2017   HGB 14.3 09/09/2017   HCT 43 09/09/2017   MCV 90.4 07/31/2017   PLT 181 09/09/2017   Lab Results  Component Value Date   CREATININE 0.9 09/09/2017   BUN 29 (A) 09/09/2017   NA 141 09/09/2017   K 3.9 09/09/2017   CL 102 09/02/2017   CO2 29 09/02/2017   Lab Results  Component Value Date   ALT 41 (A) 09/09/2017   AST 43 (A) 09/09/2017   ALKPHOS 73 09/09/2017   BILITOT 1.0 11/30/2016   Lab Results  Component Value Date   CHOL 99 09/09/2017   Lab Results  Component Value Date   HDL 38 09/09/2017   Lab Results  Component Value Date   LDLCALC 41 09/09/2017   Lab Results   Component Value Date   TRIG 105 09/09/2017   Lab Results  Component Value Date   CHOLHDL 2 11/30/2016   Lab Results  Component Value Date   PSA 0.80 07/31/2017   PSA 1.93 03/06/2016   PSA 0.77 03/10/2014   Lab Results  Component Value Date   HGBA1C 7.1 (H) 07/31/2017    IMPRESSION AND PLAN:  1) DM 2, good control. A1c today. Feet exam normal today. Recent diab retinpthy screening NORMAL. Lytes/cr today.  2) HTN: The current medical regimen is effective;  continue present plan and medications. Lytes/cr today.  3) HLD: tolerating statin.  Excellent lipid panel 08/2017.  An After Visit Summary was printed and given to the patient.  FOLLOW UP: Return in about 6 months (around 07/31/2018) for routine chronic illness f/u.  Signed:  Crissie Sickles, MD           01/29/2018

## 2018-01-29 ENCOUNTER — Encounter: Payer: Self-pay | Admitting: Family Medicine

## 2018-01-29 ENCOUNTER — Ambulatory Visit (INDEPENDENT_AMBULATORY_CARE_PROVIDER_SITE_OTHER): Payer: Medicare Other | Admitting: Family Medicine

## 2018-01-29 VITALS — BP 130/76 | HR 43 | Temp 98.4°F | Resp 16 | Ht 65.5 in | Wt 202.5 lb

## 2018-01-29 DIAGNOSIS — E7800 Pure hypercholesterolemia, unspecified: Secondary | ICD-10-CM

## 2018-01-29 DIAGNOSIS — E119 Type 2 diabetes mellitus without complications: Secondary | ICD-10-CM | POA: Diagnosis not present

## 2018-01-29 DIAGNOSIS — I10 Essential (primary) hypertension: Secondary | ICD-10-CM

## 2018-01-29 LAB — COMPREHENSIVE METABOLIC PANEL
ALBUMIN: 4.6 g/dL (ref 3.5–5.2)
ALT: 29 U/L (ref 0–53)
AST: 27 U/L (ref 0–37)
Alkaline Phosphatase: 59 U/L (ref 39–117)
BUN: 27 mg/dL — ABNORMAL HIGH (ref 6–23)
CALCIUM: 9.9 mg/dL (ref 8.4–10.5)
CO2: 31 mEq/L (ref 19–32)
Chloride: 100 mEq/L (ref 96–112)
Creatinine, Ser: 1.08 mg/dL (ref 0.40–1.50)
GFR: 72.03 mL/min (ref >60.00)
Glucose, Bld: 115 mg/dL — ABNORMAL HIGH (ref 70–99)
Potassium: 3.3 mEq/L — ABNORMAL LOW (ref 3.5–5.1)
Sodium: 139 mEq/L (ref 135–145)
Total Bilirubin: 1.3 mg/dL — ABNORMAL HIGH (ref 0.2–1.2)
Total Protein: 7 g/dL (ref 6.0–8.3)

## 2018-01-29 LAB — HEMOGLOBIN A1C: HEMOGLOBIN A1C: 6.7 % — AB (ref 4.6–6.5)

## 2018-01-29 MED ORDER — TETANUS-DIPHTH-ACELL PERTUSSIS 5-2.5-18.5 LF-MCG/0.5 IM SUSP: 0.5000 mL | Freq: Once | INTRAMUSCULAR | 0 refills | Status: AC

## 2018-01-30 ENCOUNTER — Encounter: Payer: Self-pay | Admitting: *Deleted

## 2018-02-13 ENCOUNTER — Telehealth: Payer: Self-pay | Admitting: Family Medicine

## 2018-02-20 ENCOUNTER — Encounter: Payer: Self-pay | Admitting: Family Medicine

## 2018-02-20 ENCOUNTER — Other Ambulatory Visit: Payer: Self-pay | Admitting: Family Medicine

## 2018-02-20 MED ORDER — POTASSIUM CHLORIDE CRYS ER 20 MEQ PO TBCR: 40.0000 meq | EXTENDED_RELEASE_TABLET | Freq: Two times a day (BID) | ORAL | 1 refills | Status: DC

## 2018-02-20 NOTE — Telephone Encounter (Signed)
He should be taking 2 tabs TWICE per day now. I'll send in new rx

## 2018-02-20 NOTE — Telephone Encounter (Signed)
Please advise if he is to continue to take two potassium tablets daily?

## 2018-02-20 NOTE — Telephone Encounter (Signed)
Pt stated that because his most recent lab work showed his potassium levels were low and Dr. Anitra Lauth had him start taking a higher dosage of 2 tablets per day twice a day. This has caused pt to run out sooner than he would have and he needs a new rx. Please advise.  Rx Refill potassium chloride SA (K-DUR,KLOR-CON) 20 MEQ tablet   Michael Hodge [facility] [address] [place] [phone] (Phone) [phone] (Fax)

## 2018-02-26 ENCOUNTER — Other Ambulatory Visit: Payer: Self-pay | Admitting: Family Medicine

## 2018-02-28 ENCOUNTER — Encounter: Payer: Self-pay | Admitting: Family Medicine

## 2018-03-03 DIAGNOSIS — D1801 Hemangioma of skin and subcutaneous tissue: Secondary | ICD-10-CM | POA: Diagnosis not present

## 2018-03-03 DIAGNOSIS — L570 Actinic keratosis: Secondary | ICD-10-CM | POA: Diagnosis not present

## 2018-03-03 DIAGNOSIS — Z85828 Personal history of other malignant neoplasm of skin: Secondary | ICD-10-CM | POA: Diagnosis not present

## 2018-03-03 DIAGNOSIS — L821 Other seborrheic keratosis: Secondary | ICD-10-CM | POA: Diagnosis not present

## 2018-04-04 ENCOUNTER — Other Ambulatory Visit: Payer: Self-pay | Admitting: Family Medicine

## 2018-04-10 ENCOUNTER — Encounter: Payer: Self-pay | Admitting: Podiatry

## 2018-04-10 ENCOUNTER — Ambulatory Visit (INDEPENDENT_AMBULATORY_CARE_PROVIDER_SITE_OTHER): Payer: Medicare Other

## 2018-04-10 ENCOUNTER — Other Ambulatory Visit: Payer: Self-pay | Admitting: Podiatry

## 2018-04-10 ENCOUNTER — Ambulatory Visit (INDEPENDENT_AMBULATORY_CARE_PROVIDER_SITE_OTHER): Payer: Medicare Other | Admitting: Podiatry

## 2018-04-10 VITALS — BP 157/77 | HR 55 | Resp 16

## 2018-04-10 DIAGNOSIS — L600 Ingrowing nail: Secondary | ICD-10-CM | POA: Diagnosis not present

## 2018-04-10 DIAGNOSIS — M779 Enthesopathy, unspecified: Secondary | ICD-10-CM

## 2018-04-10 DIAGNOSIS — M79671 Pain in right foot: Secondary | ICD-10-CM

## 2018-04-10 DIAGNOSIS — M7751 Other enthesopathy of right foot: Secondary | ICD-10-CM

## 2018-04-10 MED ORDER — NEOMYCIN-POLYMYXIN-HC 3.5-10000-1 OT SOLN: OTIC | 0 refills | Status: DC

## 2018-04-10 NOTE — Progress Notes (Signed)
Subjective:   Patient ID: Michael Hodge, male   DOB: 68 y.o.   MRN: [id number]   HPI Patient states he had quite a bit of swelling in his forefoot right which seems to have improved some but he is concerned about it and also has had chronic ingrown toenail of his right big toe that he tries to trim and soak with continued symptoms noted with history of having had a left one fixed when he was younger.  Patient does not smoke and likes to be active   Review of Systems  All other systems reviewed and are negative.       Objective:  Physical Exam Vitals signs and nursing note reviewed.  Constitutional:      Appearance: He is well-developed.  Pulmonary:     Effort: Pulmonary effort is normal.  Musculoskeletal: Normal range of motion.  Skin:    General: Skin is warm.  Neurological:     Mental Status: He is alert.     Neurovascular status intact muscle strength is found to be adequate range of motion within normal limits with patient found to have incurvated medial border right hallux that is painful when pressed and make shoe gear difficult with patient noted to have inflammation of the forefoot right localized with mild elevation of the second digit which has occurred.  Patient is found to have good digital perfusion well oriented x3     Assessment:  Ingrown toenail deformity right hallux medial border with forefoot inflammation with possibility for flexor plate disruption right     Plan:  H&P x-rays reviewed condition discussed and I discussed correction of the ingrown toenail.  Patient wants this fixed and I allowed him to read consent form for correction and I went ahead today and I infiltrated the right hallux 60 mg like Marcaine mixture with sterile prep I then remove the medial border exposed matrix and applied phenol 3 applications 30 seconds followed by alcohol and sterile dressing.  Gave instructions for compression and to leave it on 24 hours but to take it off earlier if any  symptoms should occur and for the forefoot we will monitor it and if it were to get worse we will get a need to consider treatment  X-ray indicated there is moderate elevation second digit right with separation between the hallux and third digit

## 2018-04-10 NOTE — Progress Notes (Signed)
   Subjective:    Patient ID: Michael Hodge, male    DOB: 1950-07-07, 68 y.o.   MRN: [id number]  HPI    Review of Systems  All other systems reviewed and are negative.      Objective:   Physical Exam        Assessment & Plan:

## 2018-04-10 NOTE — Patient Instructions (Signed)

## 2018-05-30 ENCOUNTER — Other Ambulatory Visit: Payer: Self-pay | Admitting: Family Medicine

## 2018-06-02 ENCOUNTER — Other Ambulatory Visit: Payer: Self-pay | Admitting: Family Medicine

## 2018-06-09 ENCOUNTER — Other Ambulatory Visit: Payer: Self-pay | Admitting: Family Medicine

## 2018-07-30 ENCOUNTER — Ambulatory Visit: Payer: Medicare Other | Admitting: Family Medicine

## 2018-08-26 ENCOUNTER — Other Ambulatory Visit: Payer: Self-pay

## 2018-08-26 ENCOUNTER — Encounter: Payer: Self-pay | Admitting: Family Medicine

## 2018-08-26 ENCOUNTER — Ambulatory Visit (INDEPENDENT_AMBULATORY_CARE_PROVIDER_SITE_OTHER): Payer: Medicare Other | Admitting: Family Medicine

## 2018-08-26 VITALS — BP 125/69 | HR 55 | Temp 97.6°F | Wt 205.6 lb

## 2018-08-26 DIAGNOSIS — Z125 Encounter for screening for malignant neoplasm of prostate: Secondary | ICD-10-CM | POA: Diagnosis not present

## 2018-08-26 DIAGNOSIS — E119 Type 2 diabetes mellitus without complications: Secondary | ICD-10-CM

## 2018-08-26 DIAGNOSIS — I10 Essential (primary) hypertension: Secondary | ICD-10-CM | POA: Diagnosis not present

## 2018-08-26 DIAGNOSIS — M2041 Other hammer toe(s) (acquired), right foot: Secondary | ICD-10-CM | POA: Diagnosis not present

## 2018-08-26 DIAGNOSIS — E7800 Pure hypercholesterolemia, unspecified: Secondary | ICD-10-CM | POA: Diagnosis not present

## 2018-08-26 NOTE — Progress Notes (Signed)
Virtual Visit via Video Note  I connected with pt on 08/26/18 at 10:00 AM EDT by a video enabled telemedicine application and verified that I am speaking with the correct person using two identifiers.  Location patient: home Location provider:work or home office Persons participating in the virtual visit: patient, provider  I discussed the limitations of evaluation and management by telemedicine and the availability of in person appointments. The patient expressed understanding and agreed to proceed.  Telemedicine visit is a necessity given the COVID-19 restrictions in place at the current time.  HPI: 68 y/o WM being seen today for 6 mo f/u diet-controlled DM, HTN, and HLD. He has been pretty good.  Frustrated with covid 19 crisis.  DM: "general sugars pretty good".  No fastings.  Checks 2H PP-->130s-150s range usually. HTN: bp check q 3 wk or so 130/80 or so. HLD: tolerating statin.  Feet: no burning, tingling, or numbness. Right great toenail has been removed.  2nd toe now raised and rubs inside of shoe.  Diet: pretty good diabetic diet. Exercise: not much.   ROS: no CP, no SOB, no wheezing, no cough, no dizziness, no HAs, no rashes, no melena/hematochezia.  No polyuria or polydipsia.  No myalgias or arthralgias.   Past Medical History:  Diagnosis Date  . Allergy   . Arthritis   . CAD (coronary artery disease)    CT demonstrated nonobstructive plaques (this test was prompted by borderline ETT.  . Diabetes mellitus without complication ([place]) dx'd 04/2353   Fasting gluc 136 and HbA1c 6.7% at diagnosis.   . Diverticulosis   . GERD (gastroesophageal reflux disease)    occ  . Hx of adenomatous colonic polyps   . Hyperlipidemia   . Hypertension   . Internal hemorrhoids   . Obesity, Class II, BMI 35-39.9     Past Surgical History:  Procedure Laterality Date  . CARDIOVASCULAR STRESS TEST  02/2013   LexiScan/low-level exercise-Myoview (02/2013): No ischemia, EF 53%, occ  symptomatic PVCs.  . COLONOSCOPY    . COLONOSCOPY W/ POLYPECTOMY  2005;08/21/10;11/29/15   adenomatous; also mild diverticulosis; recall 2017.  iFOB neg 09/2011.  11/2015: tubular adenoma x 1. Recall 5 yrs (11/2020)  . POLYPECTOMY    . remvoal sebaceous cyst  15-20 years ago  . TONSILLECTOMY      Family History  Problem Relation Age of Onset  . Diabetes Father   . Stroke Father   . Heart attack Mother   . Stroke Other   . Colon cancer Neg Hx   . Colon polyps Neg Hx   . Rectal cancer Neg Hx   . Stomach cancer Neg Hx     SOCIAL HX:  Social History   Socioeconomic History  . Marital status: Married    Spouse name: Not on file  . Number of children: Not on file  . Years of education: Not on file  . Highest education level: Not on file  Occupational History  . Not on file  Social Needs  . Financial resource strain: Not on file  . Food insecurity    Worry: Not on file    Inability: Not on file  . Transportation needs    Medical: Not on file    Non-medical: Not on file  Tobacco Use  . Smoking status: Never Smoker  . Smokeless tobacco: Never Used  Substance and Sexual Activity  . Alcohol use: Yes    Alcohol/week: 3.0 standard drinks    Types: 3 Shots of liquor per week  Comment: occ  . Drug use: No  . Sexual activity: Yes  Lifestyle  . Physical activity    Days per week: Not on file    Minutes per session: Not on file  . Stress: Not on file  Relationships  . Social Herbalist on phone: Not on file    Gets together: Not on file    Attends religious service: Not on file    Active member of club or organization: Not on file    Attends meetings of clubs or organizations: Not on file    Relationship status: Not on file  Other Topics Concern  . Not on file  Social History Narrative   Married x 40 yrs.   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   No T/A/Ds.     Current Outpatient Medications:  .  aspirin 81 MG tablet, Take 81  mg by mouth daily.  , Disp: , Rfl:  .  atorvastatin (LIPITOR) 40 MG tablet, TAKE ONE TABLET BY MOUTH DAILY, Disp: 90 tablet, Rfl: 0 .  diclofenac (VOLTAREN) 75 MG EC tablet, TAKE ONE TABLET BY MOUTH TWICE A DAY AS NEEDED, Disp: 60 tablet, Rfl: 6 .  FREESTYLE LITE test strip, USE AS INSTRUCTED, Disp: 50 each, Rfl: 3 .  hydrochlorothiazide (HYDRODIURIL) 25 MG tablet, TAKE ONE TABLET BY MOUTH DAILY, Disp: 90 tablet, Rfl: 0 .  potassium chloride SA (K-DUR,KLOR-CON) 20 MEQ tablet, Take 2 tablets (40 mEq total) by mouth 2 (two) times daily., Disp: 360 tablet, Rfl: 1  EXAM:  VITALS per patient if applicable: BP 270/62 (BP Location: Left Arm, Patient Position: Sitting, Cuff Size: Large)   Pulse (!) 55   Temp 97.6 F (36.4 C) (Oral)   Wt 205 lb 9.6 oz (93.3 kg)   BMI 33.69 kg/m    GENERAL: alert, oriented, appears well and in no acute distress  HEENT: atraumatic, conjunttiva clear, no obvious abnormalities on inspection of external nose and ears  NECK: normal movements of the head and neck  LUNGS: on inspection no signs of respiratory distress, breathing rate appears normal, no obvious gross SOB, gasping or wheezing  CV: no obvious cyanosis  MS: moves all visible extremities without noticeable abnormality R foot 2nd toe with hammertoe deformity w/out erythema or skin breakdown.  PSYCH/NEURO: pleasant and cooperative, no obvious depression or anxiety, speech and thought processing grossly intact  LABS: none today  Lab Results  Component Value Date   TSH 3.12 07/31/2017   Lab Results  Component Value Date   WBC 6.0 09/09/2017   HGB 14.3 09/09/2017   HCT 43 09/09/2017   MCV 90.4 07/31/2017   PLT 181 09/09/2017   Lab Results  Component Value Date   CREATININE 1.08 01/29/2018   BUN 27 (H) 01/29/2018   NA 139 01/29/2018   K 3.3 (L) 01/29/2018   CL 100 01/29/2018   CO2 31 01/29/2018   Lab Results  Component Value Date   ALT 29 01/29/2018   AST 27 01/29/2018   ALKPHOS 59  01/29/2018   BILITOT 1.3 (H) 01/29/2018   Lab Results  Component Value Date   CHOL 99 09/09/2017   Lab Results  Component Value Date   HDL 38 09/09/2017   Lab Results  Component Value Date   LDLCALC 41 09/09/2017   Lab Results  Component Value Date   TRIG 105 09/09/2017   Lab Results  Component Value Date   CHOLHDL 2 11/30/2016  Lab Results  Component Value Date   PSA 0.80 07/31/2017   PSA 1.93 03/06/2016   PSA 0.77 03/10/2014   Lab Results  Component Value Date   HGBA1C 6.7 (H) 01/29/2018    ASSESSMENT AND PLAN:  Discussed the following assessment and plan:  1) DM 2: diet controlled.  No complications. HbA1c, lytes/cr, lipids--future when fasting.  2) HTN: The current medical regimen is effective;  continue present plan and medications. Lytes/cr-future.  3) HLD: tolerating statin.  Diet pretty good.  Needs to pick up on the exercise. FLP and hepatic panel-future.  4) Preventative health care: annual PSA for prostate ca screening ordered to be done with upcoming fasting labs.  5) He has a R foot 2nd toe hammertoe deformity that is not bothering him too much at this time and he is able to wear shoe with plenty of toespace.  He has podiatrist, Dr. Paulla Dolly, whom he will make f/u appt with if the toe begins to be more symptomatic.    I discussed the assessment and treatment plan with the patient. The patient was provided an opportunity to ask questions and all were answered. The patient agreed with the plan and demonstrated an understanding of the instructions.   The patient was advised to call back or seek an in-person evaluation if the symptoms worsen or if the condition fails to improve as anticipated.  F/u: 6 mo  Signed:  Crissie Sickles, MD           08/26/2018

## 2018-08-27 ENCOUNTER — Ambulatory Visit: Payer: Medicare Other | Admitting: Family Medicine

## 2018-09-03 ENCOUNTER — Other Ambulatory Visit: Payer: Self-pay | Admitting: Family Medicine

## 2018-09-05 ENCOUNTER — Other Ambulatory Visit: Payer: Self-pay | Admitting: Family Medicine

## 2018-09-05 NOTE — Telephone Encounter (Signed)
RF request for voltaren tabs. Last OV 08/26/2018 Last RX 01/04/2017 # 60 X 6 rfs.  Is refill appropriate?  Please advise.

## 2018-09-08 ENCOUNTER — Other Ambulatory Visit: Payer: Self-pay

## 2018-09-08 MED ORDER — HYDROCHLOROTHIAZIDE 25 MG PO TABS: 25.0000 mg | ORAL_TABLET | Freq: Every day | ORAL | 1 refills | Status: DC

## 2018-09-08 MED ORDER — ATORVASTATIN CALCIUM 40 MG PO TABS: 40.0000 mg | ORAL_TABLET | Freq: Every day | ORAL | 1 refills | Status: DC

## 2018-10-14 ENCOUNTER — Telehealth: Payer: Self-pay | Admitting: Family Medicine

## 2018-10-14 ENCOUNTER — Other Ambulatory Visit: Payer: Self-pay

## 2018-10-14 ENCOUNTER — Ambulatory Visit (INDEPENDENT_AMBULATORY_CARE_PROVIDER_SITE_OTHER): Payer: Medicare Other

## 2018-10-14 DIAGNOSIS — I10 Essential (primary) hypertension: Secondary | ICD-10-CM

## 2018-10-14 DIAGNOSIS — E119 Type 2 diabetes mellitus without complications: Secondary | ICD-10-CM | POA: Diagnosis not present

## 2018-10-14 DIAGNOSIS — E7800 Pure hypercholesterolemia, unspecified: Secondary | ICD-10-CM

## 2018-10-14 DIAGNOSIS — Z125 Encounter for screening for malignant neoplasm of prostate: Secondary | ICD-10-CM | POA: Diagnosis not present

## 2018-10-14 LAB — LIPID PANEL
Cholesterol: 108 mg/dL (ref 0–200)
HDL: 43.9 mg/dL (ref >39.00)
LDL Cholesterol: 41 mg/dL (ref 0–99)
NonHDL: 64.48
Total CHOL/HDL Ratio: 2
Triglycerides: 118 mg/dL (ref 0.0–149.0)
VLDL: 23.6 mg/dL (ref 0.0–40.0)

## 2018-10-14 LAB — COMPREHENSIVE METABOLIC PANEL
ALT: 29 U/L (ref 0–53)
AST: 26 U/L (ref 0–37)
Albumin: 4.8 g/dL (ref 3.5–5.2)
Alkaline Phosphatase: 56 U/L (ref 39–117)
BUN: 23 mg/dL (ref 6–23)
CO2: 29 mEq/L (ref 19–32)
Calcium: 9.9 mg/dL (ref 8.4–10.5)
Chloride: 98 mEq/L (ref 96–112)
Creatinine, Ser: 1.02 mg/dL (ref 0.40–1.50)
GFR: 72.24 mL/min (ref >60.00)
Glucose, Bld: 112 mg/dL — ABNORMAL HIGH (ref 70–99)
Potassium: 3.5 mEq/L (ref 3.5–5.1)
Sodium: 137 mEq/L (ref 135–145)
Total Bilirubin: 1.2 mg/dL (ref 0.2–1.2)
Total Protein: 7.5 g/dL (ref 6.0–8.3)

## 2018-10-14 LAB — PSA, MEDICARE: PSA: 1.36 ng/ml (ref 0.10–4.00)

## 2018-10-14 LAB — HEMOGLOBIN A1C: Hgb A1c MFr Bld: 7.2 % — ABNORMAL HIGH (ref 4.6–6.5)

## 2018-10-14 NOTE — Telephone Encounter (Signed)
error 

## 2018-10-20 ENCOUNTER — Encounter: Payer: Self-pay | Admitting: Family Medicine

## 2018-10-20 NOTE — Telephone Encounter (Signed)
Called patient and told him that some minute clinics at [facility] are doing rapid flu. I advised him that if he cannot find one, [facility] or one of our sites with a order can test him for COVID. Patient told me he will let me know if he cannot find somewhere local to get a COVID test prior to his trip.

## 2018-10-22 DIAGNOSIS — Z85828 Personal history of other malignant neoplasm of skin: Secondary | ICD-10-CM | POA: Diagnosis not present

## 2018-10-22 DIAGNOSIS — L723 Sebaceous cyst: Secondary | ICD-10-CM | POA: Diagnosis not present

## 2018-10-22 DIAGNOSIS — C44319 Basal cell carcinoma of skin of other parts of face: Secondary | ICD-10-CM | POA: Diagnosis not present

## 2018-10-22 DIAGNOSIS — L821 Other seborrheic keratosis: Secondary | ICD-10-CM | POA: Diagnosis not present

## 2018-10-22 DIAGNOSIS — L570 Actinic keratosis: Secondary | ICD-10-CM | POA: Diagnosis not present

## 2018-10-22 DIAGNOSIS — D485 Neoplasm of uncertain behavior of skin: Secondary | ICD-10-CM | POA: Diagnosis not present

## 2018-10-22 DIAGNOSIS — L718 Other rosacea: Secondary | ICD-10-CM | POA: Diagnosis not present

## 2018-11-21 ENCOUNTER — Telehealth: Payer: Self-pay

## 2018-11-21 NOTE — Telephone Encounter (Signed)
-----   Message from Nickola Major sent at 11/21/2018  8:35 AM EDT ----- Patient has been up all night with a painful swollen right elbow. Patient is requesting an same day appointment. Thanks, Diane

## 2018-11-21 NOTE — Telephone Encounter (Signed)
Can patient be worked in today? Please advise.

## 2018-11-23 NOTE — Telephone Encounter (Signed)
OK to work in 11/24/18

## 2018-11-24 ENCOUNTER — Other Ambulatory Visit: Payer: Self-pay | Admitting: Family Medicine

## 2018-11-24 DIAGNOSIS — E119 Type 2 diabetes mellitus without complications: Secondary | ICD-10-CM

## 2018-11-24 NOTE — Telephone Encounter (Signed)
Patient never had painful, swollen right elbow.  Must have been documented in wrong chart.

## 2018-11-25 DIAGNOSIS — C44319 Basal cell carcinoma of skin of other parts of face: Secondary | ICD-10-CM | POA: Diagnosis not present

## 2018-11-25 DIAGNOSIS — Z85828 Personal history of other malignant neoplasm of skin: Secondary | ICD-10-CM | POA: Diagnosis not present

## 2018-11-26 ENCOUNTER — Ambulatory Visit (INDEPENDENT_AMBULATORY_CARE_PROVIDER_SITE_OTHER): Payer: Medicare Other

## 2018-11-26 ENCOUNTER — Other Ambulatory Visit: Payer: Self-pay

## 2018-11-26 ENCOUNTER — Ambulatory Visit: Payer: Medicare Other

## 2018-11-26 DIAGNOSIS — E119 Type 2 diabetes mellitus without complications: Secondary | ICD-10-CM

## 2018-11-26 DIAGNOSIS — Z23 Encounter for immunization: Secondary | ICD-10-CM | POA: Diagnosis not present

## 2018-11-26 LAB — MICROALBUMIN / CREATININE URINE RATIO
Creatinine,U: 66 mg/dL
Microalb Creat Ratio: 1.1 mg/g (ref 0.0–30.0)
Microalb, Ur: <0.7 mg/dL (ref 0.0–1.9)

## 2018-12-02 ENCOUNTER — Other Ambulatory Visit: Payer: Self-pay | Admitting: Family Medicine

## 2018-12-28 ENCOUNTER — Encounter: Payer: Self-pay | Admitting: Family Medicine

## 2018-12-29 ENCOUNTER — Other Ambulatory Visit: Payer: Self-pay

## 2018-12-29 MED ORDER — FREESTYLE LITE TEST VI STRP: ORAL_STRIP | 3 refills | Status: DC

## 2019-01-13 DIAGNOSIS — H5213 Myopia, bilateral: Secondary | ICD-10-CM | POA: Diagnosis not present

## 2019-01-13 DIAGNOSIS — E119 Type 2 diabetes mellitus without complications: Secondary | ICD-10-CM | POA: Diagnosis not present

## 2019-01-13 DIAGNOSIS — H25813 Combined forms of age-related cataract, bilateral: Secondary | ICD-10-CM | POA: Diagnosis not present

## 2019-01-13 DIAGNOSIS — H40053 Ocular hypertension, bilateral: Secondary | ICD-10-CM | POA: Diagnosis not present

## 2019-01-13 DIAGNOSIS — H524 Presbyopia: Secondary | ICD-10-CM | POA: Diagnosis not present

## 2019-01-13 DIAGNOSIS — H52223 Regular astigmatism, bilateral: Secondary | ICD-10-CM | POA: Diagnosis not present

## 2019-01-13 DIAGNOSIS — H40013 Open angle with borderline findings, low risk, bilateral: Secondary | ICD-10-CM | POA: Diagnosis not present

## 2019-03-10 ENCOUNTER — Encounter: Payer: Self-pay | Admitting: Family Medicine

## 2019-03-13 ENCOUNTER — Other Ambulatory Visit: Payer: Self-pay | Admitting: Family Medicine

## 2019-03-13 ENCOUNTER — Other Ambulatory Visit: Payer: Self-pay

## 2019-03-18 ENCOUNTER — Ambulatory Visit: Payer: Medicare Other | Attending: Internal Medicine

## 2019-03-18 ENCOUNTER — Other Ambulatory Visit: Payer: Self-pay

## 2019-03-18 DIAGNOSIS — Z23 Encounter for immunization: Secondary | ICD-10-CM | POA: Insufficient documentation

## 2019-03-18 NOTE — Progress Notes (Signed)
   Covid-19 Vaccination Clinic  Name:  Michael Hodge    MRN: [id number] DOB: 17-Jul-1950  03/18/2019  [name] was observed post Covid-19 immunization for 15 minutes without incidence. He was provided with Vaccine Information Sheet and instruction to access the V-Safe system.   [name] was instructed to call 911 with any severe reactions post vaccine: Marland Kitchen Difficulty breathing  . Swelling of your face and throat  . A fast heartbeat  . A bad rash all over your body  . Dizziness and weakness    Immunizations Administered    Name Date Dose VIS Date Route   Pfizer COVID-19 Vaccine 03/18/2019 12:06 PM 0.3 mL 02/06/2019 Intramuscular   Manufacturer: [place]   Lot: [id number]   [place]: [id number]

## 2019-03-27 ENCOUNTER — Ambulatory Visit: Payer: Medicare Other

## 2019-04-09 ENCOUNTER — Ambulatory Visit: Payer: Medicare Other | Attending: Internal Medicine

## 2019-04-09 DIAGNOSIS — Z23 Encounter for immunization: Secondary | ICD-10-CM | POA: Insufficient documentation

## 2019-04-09 NOTE — Progress Notes (Signed)
   Covid-19 Vaccination Clinic  Name:  Michael Hodge    MRN: [id number] DOB: 1950-10-11  04/09/2019  Mr. Strange was observed post Covid-19 immunization for 15 minutes without incidence. He was provided with Vaccine Information Sheet and instruction to access the V-Safe system.   [name] was instructed to call 911 with any severe reactions post vaccine: Marland Kitchen Difficulty breathing  . Swelling of your face and throat  . A fast heartbeat  . A bad rash all over your body  . Dizziness and weakness    Immunizations Administered    Name Date Dose VIS Date Route   Pfizer COVID-19 Vaccine 04/09/2019  8:23 AM 0.3 mL 02/06/2019 Intramuscular   Manufacturer: [place]   Lot: [id number]   [place]: [id number]

## 2019-04-10 ENCOUNTER — Ambulatory Visit: Payer: Medicare Other

## 2019-04-16 ENCOUNTER — Other Ambulatory Visit: Payer: Self-pay | Admitting: Family Medicine

## 2019-05-13 ENCOUNTER — Other Ambulatory Visit: Payer: Self-pay | Admitting: Family Medicine

## 2019-06-02 ENCOUNTER — Other Ambulatory Visit: Payer: Self-pay | Admitting: Family Medicine

## 2019-06-02 NOTE — Telephone Encounter (Signed)
Patient is aware he is due for next appt but was driving. Will call back to schedule and short supply will be sent to last until appt.

## 2019-06-09 ENCOUNTER — Other Ambulatory Visit: Payer: Self-pay | Admitting: Family Medicine

## 2019-07-01 ENCOUNTER — Ambulatory Visit: Payer: Medicare Other | Admitting: Family Medicine

## 2019-07-02 ENCOUNTER — Other Ambulatory Visit: Payer: Self-pay | Admitting: Family Medicine

## 2019-07-02 DIAGNOSIS — L821 Other seborrheic keratosis: Secondary | ICD-10-CM | POA: Diagnosis not present

## 2019-07-02 DIAGNOSIS — H40053 Ocular hypertension, bilateral: Secondary | ICD-10-CM | POA: Diagnosis not present

## 2019-07-02 DIAGNOSIS — L814 Other melanin hyperpigmentation: Secondary | ICD-10-CM | POA: Diagnosis not present

## 2019-07-02 DIAGNOSIS — Z85828 Personal history of other malignant neoplasm of skin: Secondary | ICD-10-CM | POA: Diagnosis not present

## 2019-07-02 DIAGNOSIS — L570 Actinic keratosis: Secondary | ICD-10-CM | POA: Diagnosis not present

## 2019-07-02 DIAGNOSIS — L720 Epidermal cyst: Secondary | ICD-10-CM | POA: Diagnosis not present

## 2019-07-02 DIAGNOSIS — D2272 Melanocytic nevi of left lower limb, including hip: Secondary | ICD-10-CM | POA: Diagnosis not present

## 2019-07-03 ENCOUNTER — Other Ambulatory Visit: Payer: Self-pay

## 2019-07-03 ENCOUNTER — Ambulatory Visit (INDEPENDENT_AMBULATORY_CARE_PROVIDER_SITE_OTHER): Payer: Medicare Other | Admitting: Family Medicine

## 2019-07-03 ENCOUNTER — Encounter: Payer: Self-pay | Admitting: Family Medicine

## 2019-07-03 VITALS — BP 128/90 | HR 58 | Temp 97.4°F | Resp 16 | Ht 65.5 in | Wt 204.2 lb

## 2019-07-03 DIAGNOSIS — E7800 Pure hypercholesterolemia, unspecified: Secondary | ICD-10-CM | POA: Diagnosis not present

## 2019-07-03 DIAGNOSIS — E119 Type 2 diabetes mellitus without complications: Secondary | ICD-10-CM

## 2019-07-03 DIAGNOSIS — I10 Essential (primary) hypertension: Secondary | ICD-10-CM | POA: Diagnosis not present

## 2019-07-03 LAB — LIPID PANEL
Cholesterol: 96 mg/dL (ref 0–200)
HDL: 38 mg/dL — ABNORMAL LOW (ref >39.00)
LDL Cholesterol: 42 mg/dL (ref 0–99)
NonHDL: 58.28
Total CHOL/HDL Ratio: 3
Triglycerides: 83 mg/dL (ref 0.0–149.0)
VLDL: 16.6 mg/dL (ref 0.0–40.0)

## 2019-07-03 LAB — COMPREHENSIVE METABOLIC PANEL
ALT: 24 U/L (ref 0–53)
AST: 27 U/L (ref 0–37)
Albumin: 4.4 g/dL (ref 3.5–5.2)
Alkaline Phosphatase: 66 U/L (ref 39–117)
BUN: 24 mg/dL — ABNORMAL HIGH (ref 6–23)
CO2: 31 mEq/L (ref 19–32)
Calcium: 9.8 mg/dL (ref 8.4–10.5)
Chloride: 100 mEq/L (ref 96–112)
Creatinine, Ser: 0.97 mg/dL (ref 0.40–1.50)
GFR: 76.4 mL/min (ref >60.00)
Glucose, Bld: 127 mg/dL — ABNORMAL HIGH (ref 70–99)
Potassium: 3.6 mEq/L (ref 3.5–5.1)
Sodium: 138 mEq/L (ref 135–145)
Total Bilirubin: 1.2 mg/dL (ref 0.2–1.2)
Total Protein: 6.9 g/dL (ref 6.0–8.3)

## 2019-07-03 LAB — HEMOGLOBIN A1C: Hgb A1c MFr Bld: 7 % — ABNORMAL HIGH (ref 4.6–6.5)

## 2019-07-03 MED ORDER — ATORVASTATIN CALCIUM 40 MG PO TABS: 40.0000 mg | ORAL_TABLET | Freq: Every day | ORAL | 3 refills | Status: DC

## 2019-07-03 MED ORDER — POTASSIUM CHLORIDE CRYS ER 20 MEQ PO TBCR: 40.0000 meq | EXTENDED_RELEASE_TABLET | Freq: Two times a day (BID) | ORAL | 3 refills | Status: DC

## 2019-07-03 MED ORDER — HYDROCHLOROTHIAZIDE 25 MG PO TABS: 25.0000 mg | ORAL_TABLET | Freq: Every day | ORAL | 3 refills | Status: DC

## 2019-07-03 NOTE — Progress Notes (Signed)
Office Note 07/03/2019  CC:  Chief Complaint  Patient presents with  . Diabetes    Pt states fasting. Pt states checking sugars after eating by 2 hours and around 130 to 150 depending on what he eats.  . Follow-up    HPI:  Michael Hodge is a 69 y.o. White male who is here for 6 mo f/u HTN, HLD, and diet controlled DM.  Active, walking, quail hunting.  Diet fair, could use improving. "Lots of home cooking".  DM: a1c was up to 7.2% last check about 8 mo ago.  Option of starting med vs improving diet/exercise-->pt chose TLC. 2H pp glucoses lately 130-150 range.  Feet: occ feeling of dec sensation in toes.    HTN: home bp's consistently 120s/70s.  HLD: tolerating statin daily.  Has slight red spot that occ gets a bit sore on R 2nd toe dorsal surface at DIP jt. Says he will likely go back to Dr. Silvio Pate about this.  ROS: no fevers, no CP, no SOB, no wheezing, no cough, no dizziness, no HAs, no rashes, no melena/hematochezia.  No polyuria or polydipsia.  No myalgias or arthralgias.  No focal weakness, paresthesias, or tremors.  No acute vision or hearing abnormalities. No n/v/d or abd pain.  No palpitations.      Past Medical History:  Diagnosis Date  . Allergy   . Arthritis   . CAD (coronary artery disease)    CT demonstrated nonobstructive plaques (this test was prompted by borderline ETT.  . Diabetes mellitus without complication ([place]) dx'd [id number]   Fasting gluc 136 and HbA1c 6.7% at diagnosis.   . Diverticulosis   . GERD (gastroesophageal reflux disease)    occ  . Hx of adenomatous colonic polyps   . Hyperlipidemia   . Hypertension   . Internal hemorrhoids   . Obesity, Class II, BMI 35-39.9     Past Surgical History:  Procedure Laterality Date  . CARDIOVASCULAR STRESS TEST  02/2013   LexiScan/low-level exercise-Myoview (02/2013): No ischemia, EF 53%, occ symptomatic PVCs.  . COLONOSCOPY    . COLONOSCOPY W/ POLYPECTOMY  2005;08/21/10;11/29/15   adenomatous; also  mild diverticulosis; recall 2017.  iFOB neg 09/2011.  11/2015: tubular adenoma x 1. Recall 5 yrs (11/2020)  . POLYPECTOMY    . remvoal sebaceous cyst  15-20 years ago  . TONSILLECTOMY      Family History  Problem Relation Age of Onset  . Diabetes Father   . Stroke Father   . Heart attack Mother   . Stroke Other   . Colon cancer Neg Hx   . Colon polyps Neg Hx   . Rectal cancer Neg Hx   . Stomach cancer Neg Hx     Social History   Socioeconomic History  . Marital status: Married    Spouse name: Not on file  . Number of children: Not on file  . Years of education: Not on file  . Highest education level: Not on file  Occupational History  . Not on file  Tobacco Use  . Smoking status: Never Smoker  . Smokeless tobacco: Never Used  Substance and Sexual Activity  . Alcohol use: Yes    Alcohol/week: 3.0 standard drinks    Types: 3 Shots of liquor per week    Comment: occ  . Drug use: No  . Sexual activity: Yes  Other Topics Concern  . Not on file  Social History Narrative   Married x 40 yrs.   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   No T/A/Ds.   Social Determinants of Health   Financial Resource Strain:   . Difficulty of Paying Living Expenses:   Food Insecurity:   . Worried About Charity fundraiser in the Last Year:   . Arboriculturist in the Last Year:   Transportation Needs:   . Film/video editor (Medical):   Marland Kitchen Lack of Transportation (Non-Medical):   Physical Activity:   . Days of Exercise per Week:   . Minutes of Exercise per Session:   Stress:   . Feeling of Stress :   Social Connections:   . Frequency of Communication with Friends and Family:   . Frequency of Social Gatherings with Friends and Family:   . Attends Religious Services:   . Active Member of Clubs or Organizations:   . Attends Archivist Meetings:   Marland Kitchen Marital Status:   Intimate Partner Violence:   . Fear of Current or Ex-Partner:   . Emotionally  Abused:   Marland Kitchen Physically Abused:   . Sexually Abused:     Outpatient Medications Prior to Visit  Medication Sig Dispense Refill  . aspirin 81 MG tablet Take 81 mg by mouth daily.      . diclofenac (VOLTAREN) 75 MG EC tablet TAKE ONE TABLET BY MOUTH TWICE A DAY AS NEEDED 60 tablet 5  . glucose blood (FREESTYLE LITE) test strip USE AS INSTRUCTED 50 each 3  . atorvastatin (LIPITOR) 40 MG tablet TAKE ONE TABLET BY MOUTH DAILY 30 tablet 0  . hydrochlorothiazide (HYDRODIURIL) 25 MG tablet TAKE ONE TABLET BY MOUTH DAILY 30 tablet 0  . KLOR-CON M20 20 MEQ tablet TAKE TWO TABLETS BY MOUTH TWICE A DAY 120 tablet 0   No facility-administered medications prior to visit.    No Known Allergies  PE; Vitals with BMI 07/03/2019 08/26/2018 04/10/2018  Height 5' 5.5" - -  Weight 204 lbs 3 oz 205 lbs 10 oz -  BMI [id number] - -  Systolic 0000000 0000000 [id number]  Diastolic 90 69 77  Pulse 58 55 55    Gen: Alert, well appearing.  Patient is oriented to person, place, time, and situation. AFFECT: pleasant, lucid thought and speech. CV: RRR, no m/r/g.   LUNGS: CTA bilat, nonlabored resps, good aeration in all lung fields. EXT: no clubbing or cyanosis.  no edema.  Foot exam - no swelling, tenderness or skin or vascular lesions. Color and temperature is normal. Sensation is intact. Peripheral pulses are palpable. Toenails are normal. Mild hammertoe deformity R 2nd toe, mild pinkish hue over DIP jt.  No swelling or tenderness.  Pertinent labs:  Lab Results  Component Value Date   TSH 3.12 07/31/2017   Lab Results  Component Value Date   WBC 6.0 09/09/2017   HGB 14.3 09/09/2017   HCT 43 09/09/2017   MCV 90.4 07/31/2017   PLT 181 09/09/2017   Lab Results  Component Value Date   CREATININE 1.02 10/14/2018   BUN 23 10/14/2018   NA 137 10/14/2018   K 3.5 10/14/2018   CL 98 10/14/2018   CO2 29 10/14/2018   Lab Results  Component Value Date   ALT 29 10/14/2018   AST 26 10/14/2018   ALKPHOS 56 10/14/2018    BILITOT 1.2 10/14/2018   Lab Results  Component Value Date   CHOL 108 10/14/2018   Lab Results  Component Value Date   HDL 43.90 10/14/2018   Lab Results  Component Value Date   LDLCALC 41 10/14/2018   Lab Results  Component Value Date   TRIG 118.0 10/14/2018   Lab Results  Component Value Date   CHOLHDL 2 10/14/2018   Lab Results  Component Value Date   PSA 1.36 10/14/2018   PSA 0.80 07/31/2017   PSA 1.93 03/06/2016   Lab Results  Component Value Date   HGBA1C 7.2 (H) 10/14/2018   ASSESSMENT AND PLAN:   1) DM, diet controlled. PP glucoses fine at home. HbA1c today. Feet exam normal today, other than R 2nd toe hammertoe deformity with a bit of associated stage I prssure sore. He'll be contacting Dr. Mellody Drown office to get seen for this. Eye exam UTD. Lytes/cr today. Needs to start metformin if A1c still above 7%.  2) HTN: The current medical regimen is effective;  continue present plan and medications. Lytes/cr today.  3) HLD: tolerating statin. FLP and hepatic panel today.  4) Preventative health: all vaccines UTD including covid 19 and shingrix. Next colonoscopy 11/2020.  Prostate ca screening due next f/u 6 mo.  An After Visit Summary was printed and given to the patient.  FOLLOW UP:  Return in about 6 months (around 01/03/2020) for routine chronic illness f/u ("CPE").  Signed:  Crissie Sickles, MD           07/03/2019

## 2019-07-06 ENCOUNTER — Other Ambulatory Visit: Payer: Self-pay

## 2019-07-06 MED ORDER — METFORMIN HCL 500 MG PO TABS: 500.0000 mg | ORAL_TABLET | Freq: Every day | ORAL | 3 refills | Status: DC

## 2019-09-06 ENCOUNTER — Other Ambulatory Visit: Payer: Self-pay | Admitting: Family Medicine

## 2019-09-07 ENCOUNTER — Other Ambulatory Visit: Payer: Self-pay

## 2019-10-23 ENCOUNTER — Other Ambulatory Visit: Payer: Self-pay

## 2019-10-23 ENCOUNTER — Ambulatory Visit (INDEPENDENT_AMBULATORY_CARE_PROVIDER_SITE_OTHER): Payer: Medicare Other | Admitting: Family Medicine

## 2019-10-23 ENCOUNTER — Encounter: Payer: Self-pay | Admitting: Family Medicine

## 2019-10-23 VITALS — BP 138/80 | HR 50 | Temp 97.6°F | Resp 16 | Ht 65.5 in | Wt 205.4 lb

## 2019-10-23 DIAGNOSIS — E119 Type 2 diabetes mellitus without complications: Secondary | ICD-10-CM

## 2019-10-23 LAB — POCT GLYCOSYLATED HEMOGLOBIN (HGB A1C)
HbA1c POC (<> result, manual entry): 6.4 % (ref 4.0–5.6)
HbA1c, POC (controlled diabetic range): 6.4 % (ref 0.0–7.0)
HbA1c, POC (prediabetic range): 6.4 % (ref 5.7–6.4)
Hemoglobin A1C: 6.4 % — AB (ref 4.0–5.6)

## 2019-10-23 MED ORDER — METFORMIN HCL 500 MG PO TABS: 500.0000 mg | ORAL_TABLET | Freq: Every day | ORAL | 3 refills | Status: DC

## 2019-10-23 NOTE — Progress Notes (Signed)
OFFICE VISIT  10/23/2019   CC:  Chief Complaint  Patient presents with  . Follow-up    [facility], pt is fasting   HPI:    Patient is a 69 y.o. Caucasian male who presents for 4 mo f/u DM 2. Started metformin 500mg  q supper last visit. Mild GI upset initially but this has gone away ---takes it w/food. Occ gluc check ok, 2H PP last night 135.  Dietary adjustments and activity level always a work in progress. Pays close attention to hydration.  Past Medical History:  Diagnosis Date  . Allergy   . Arthritis   . CAD (coronary artery disease)    CT demonstrated nonobstructive plaques (this test was prompted by borderline ETT.  . Diabetes mellitus without complication ([place]) dx'd 09/6759   Fasting gluc 136 and HbA1c 6.7% at diagnosis.   . Diverticulosis   . GERD (gastroesophageal reflux disease)    occ  . Hx of adenomatous colonic polyps   . Hyperlipidemia   . Hypertension   . Internal hemorrhoids   . Obesity, Class II, BMI 35-39.9     Past Surgical History:  Procedure Laterality Date  . CARDIOVASCULAR STRESS TEST  02/2013   LexiScan/low-level exercise-Myoview (02/2013): No ischemia, EF 53%, occ symptomatic PVCs.  . COLONOSCOPY    . COLONOSCOPY W/ POLYPECTOMY  2005;08/21/10;11/29/15   adenomatous; also mild diverticulosis; recall 2017.  iFOB neg 09/2011.  11/2015: tubular adenoma x 1. Recall 5 yrs (11/2020)  . POLYPECTOMY    . remvoal sebaceous cyst  15-20 years ago  . TONSILLECTOMY      Outpatient Medications Prior to Visit  Medication Sig Dispense Refill  . aspirin 81 MG tablet Take 81 mg by mouth daily.      Marland Kitchen atorvastatin (LIPITOR) 40 MG tablet Take 1 tablet (40 mg total) by mouth daily. 90 tablet 3  . diclofenac (VOLTAREN) 75 MG EC tablet TAKE ONE TABLET BY MOUTH TWICE A DAY AS NEEDED 60 tablet 4  . glucose blood (FREESTYLE LITE) test strip USE AS INSTRUCTED 50 each 3  . hydrochlorothiazide (HYDRODIURIL) 25 MG tablet Take 1 tablet (25 mg total) by mouth daily. 90 tablet 3  .  metFORMIN (GLUCOPHAGE) 500 MG tablet Take 1 tablet (500 mg total) by mouth daily with supper. 30 tablet 3  . potassium chloride SA (KLOR-CON M20) 20 MEQ tablet Take 2 tablets (40 mEq total) by mouth 2 (two) times daily. 360 tablet 3   No facility-administered medications prior to visit.    No Known Allergies  ROS As per HPI  PE: Vitals with BMI 10/23/2019 07/03/2019 08/26/2018  Height 5' 5.5" 5' 5.5" -  Weight 205 lbs 6 oz 204 lbs 3 oz 205 lbs 10 oz  BMI 95.09 32.67 -  Systolic 124 580 998  Diastolic 80 90 69  Pulse 50 58 55  O2 sat on RA today is 99%  Gen: Alert, well appearing.  Patient is oriented to person, place, time, and situation. AFFECT: pleasant, lucid thought and speech. No further exam today.  LABS:  Lab Results  Component Value Date   TSH 3.12 07/31/2017   Lab Results  Component Value Date   WBC 6.0 09/09/2017   HGB 14.3 09/09/2017   HCT 43 09/09/2017   MCV 90.4 07/31/2017   PLT 181 09/09/2017   Lab Results  Component Value Date   CREATININE 0.97 07/03/2019   BUN 24 (H) 07/03/2019   NA 138 07/03/2019   K 3.6 07/03/2019   CL 100 07/03/2019  CO2 31 07/03/2019   Lab Results  Component Value Date   ALT 24 07/03/2019   AST 27 07/03/2019   ALKPHOS 66 07/03/2019   BILITOT 1.2 07/03/2019   Lab Results  Component Value Date   CHOL 96 07/03/2019   Lab Results  Component Value Date   HDL 38.00 (L) 07/03/2019   Lab Results  Component Value Date   LDLCALC 42 07/03/2019   Lab Results  Component Value Date   TRIG 83.0 07/03/2019   Lab Results  Component Value Date   CHOLHDL 3 07/03/2019   Lab Results  Component Value Date   PSA 1.36 10/14/2018   PSA 0.80 07/31/2017   PSA 1.93 03/06/2016   Lab Results  Component Value Date   HGBA1C 6.4 (A) 10/23/2019   HGBA1C 6.4 10/23/2019   HGBA1C 6.4 10/23/2019   HGBA1C 6.4 10/23/2019   POC Hba1c today is 6.4%  IMPRESSION AND PLAN:  1) DM 2, good response to metformin. Continue 500mg  q  supper.  Spent 20 min with pt today reviewing HPI, reviewing relevant past history, doing exam, reviewing and discussing lab and imaging data, and formulating plans.  An After Visit Summary was printed and given to the patient.  FOLLOW UP: Return in about 6 months (around 04/24/2020) for routine chronic illness f/u (his "cpe").  Signed:  Crissie Sickles, MD           10/23/2019

## 2019-11-24 DIAGNOSIS — Z23 Encounter for immunization: Secondary | ICD-10-CM | POA: Diagnosis not present

## 2020-01-12 ENCOUNTER — Ambulatory Visit: Payer: Medicare Other | Admitting: Family Medicine

## 2020-01-14 DIAGNOSIS — H52223 Regular astigmatism, bilateral: Secondary | ICD-10-CM | POA: Diagnosis not present

## 2020-01-14 DIAGNOSIS — H35033 Hypertensive retinopathy, bilateral: Secondary | ICD-10-CM | POA: Diagnosis not present

## 2020-01-14 DIAGNOSIS — H524 Presbyopia: Secondary | ICD-10-CM | POA: Diagnosis not present

## 2020-01-14 DIAGNOSIS — H5213 Myopia, bilateral: Secondary | ICD-10-CM | POA: Diagnosis not present

## 2020-01-14 DIAGNOSIS — I10 Essential (primary) hypertension: Secondary | ICD-10-CM | POA: Diagnosis not present

## 2020-01-14 DIAGNOSIS — E119 Type 2 diabetes mellitus without complications: Secondary | ICD-10-CM | POA: Diagnosis not present

## 2020-01-14 LAB — HM DIABETES EYE EXAM

## 2020-01-15 ENCOUNTER — Encounter: Payer: Self-pay | Admitting: Podiatry

## 2020-01-15 ENCOUNTER — Other Ambulatory Visit: Payer: Self-pay

## 2020-01-15 ENCOUNTER — Ambulatory Visit (INDEPENDENT_AMBULATORY_CARE_PROVIDER_SITE_OTHER): Payer: Medicare Other

## 2020-01-15 ENCOUNTER — Ambulatory Visit (INDEPENDENT_AMBULATORY_CARE_PROVIDER_SITE_OTHER): Payer: Medicare Other | Admitting: Podiatry

## 2020-01-15 DIAGNOSIS — M2040 Other hammer toe(s) (acquired), unspecified foot: Secondary | ICD-10-CM

## 2020-01-15 DIAGNOSIS — M2041 Other hammer toe(s) (acquired), right foot: Secondary | ICD-10-CM

## 2020-01-17 NOTE — Progress Notes (Signed)
Subjective:   Patient ID: Michael Hodge, male   DOB: 69 y.o.   MRN: [id number]   HPI Patient presents stating that his toe is starting to bother him more and he wants to get it fixed.  States that it is hard for him to wear shoe gear comfortably and has become more of an issue for him.   ROS      Objective:  Physical Exam  Neurovascular status intact with a rigidly contracted digit to right painful dorsally with redness across the proximal to phalangeal joint no drainage or other pathology noted     Assessment:  Significant hammertoe deformity with probable flexor plate stretch dislocation right     Plan:  H&P reviewed condition and at this point I do think digital fusion is necessary.  Explained procedure educated him on recovery and the fact he will be in a shoe for 5 weeks with pin and that total recovery can take 6 months to 1 year and no guarantee long-term the toe will purchase the ground.  Patient wants surgery after extensive review signed the consent form and is scheduled for outpatient procedure  X-rays indicate that there is significant rigid contracture digit to the right

## 2020-01-20 DIAGNOSIS — Z23 Encounter for immunization: Secondary | ICD-10-CM | POA: Diagnosis not present

## 2020-02-02 ENCOUNTER — Encounter: Payer: Self-pay | Admitting: Family Medicine

## 2020-04-11 DIAGNOSIS — Z85828 Personal history of other malignant neoplasm of skin: Secondary | ICD-10-CM | POA: Diagnosis not present

## 2020-04-11 DIAGNOSIS — L720 Epidermal cyst: Secondary | ICD-10-CM | POA: Diagnosis not present

## 2020-04-11 DIAGNOSIS — L02212 Cutaneous abscess of back [any part, except buttock]: Secondary | ICD-10-CM | POA: Diagnosis not present

## 2020-04-12 NOTE — Progress Notes (Signed)
Subjective:   Michael Hodge is a 70 y.o. male who presents for Medicare Annual/Subsequent preventive examination.  I connected with Haroldtoday by telephone and verified that I am speaking with the correct person using two identifiers. Location patient: home Location provider: work Persons participating in the virtual visit: patient, Marine scientist.    I discussed the limitations, risks, security and privacy concerns of performing an evaluation and management service by telephone and the availability of in person appointments. I also discussed with the patient that there may be a patient responsible charge related to this service. The patient expressed understanding and verbally consented to this telephonic visit.    Interactive audio and video telecommunications were attempted between this provider and patient, however failed, due to patient having technical difficulties OR patient did not have access to video capability.  We continued and completed visit with audio only.  Some vital signs may be absent or patient reported.   Time Spent with patient on telephone encounter: 20 minutes   Review of Systems     Cardiac Risk Factors include: advanced age (>62men, >22 women);male gender;diabetes mellitus;dyslipidemia;hypertension;obesity (BMI >30kg/m2);sedentary lifestyle     Objective:    Today's Vitals   04/13/20 1336  Weight: 210 lb (95.3 kg)  Height: 5' 5.5" (1.664 m)   Body mass index is 34.41 kg/m.  Advanced Directives 04/13/2020 11/30/2016 11/07/2015  Does Patient Have a Medical Advance Directive? Yes Yes No  Type of Paramedic of [place];Living will [place];Living will -  Copy of [place] in Chart? No - copy requested No - copy requested -    Current Medications (verified) Outpatient Encounter Medications as of 04/13/2020  Medication Sig  . aspirin 81 MG tablet Take 81 mg by mouth daily.  Marland Kitchen atorvastatin (LIPITOR) 40  MG tablet Take 1 tablet (40 mg total) by mouth daily.  . diclofenac (VOLTAREN) 75 MG EC tablet TAKE ONE TABLET BY MOUTH TWICE A DAY AS NEEDED  . glucose blood (FREESTYLE LITE) test strip USE AS INSTRUCTED  . hydrochlorothiazide (HYDRODIURIL) 25 MG tablet Take 1 tablet (25 mg total) by mouth daily.  . metFORMIN (GLUCOPHAGE) 500 MG tablet Take 1 tablet (500 mg total) by mouth daily with supper.  . potassium chloride SA (KLOR-CON M20) 20 MEQ tablet Take 2 tablets (40 mEq total) by mouth 2 (two) times daily.   No facility-administered encounter medications on file as of 04/13/2020.    Allergies (verified) Patient has no known allergies.   History: Past Medical History:  Diagnosis Date  . Allergy   . Arthritis   . CAD (coronary artery disease)    CT demonstrated nonobstructive plaques (this test was prompted by borderline ETT.  . Diabetes mellitus without complication ([place]) dx'd 08/3530   Fasting gluc 136 and HbA1c 6.7% at diagnosis.   . Diverticulosis   . GERD (gastroesophageal reflux disease)    occ  . Hx of adenomatous colonic polyps   . Hyperlipidemia   . Hypertension    w/hypertensive retinopathy  . Internal hemorrhoids   . Obesity, Class II, BMI 35-39.9    Past Surgical History:  Procedure Laterality Date  . CARDIOVASCULAR STRESS TEST  02/2013   LexiScan/low-level exercise-Myoview (02/2013): No ischemia, EF 53%, occ symptomatic PVCs.  . COLONOSCOPY    . COLONOSCOPY W/ POLYPECTOMY  2005;08/21/10;11/29/15   adenomatous; also mild diverticulosis; recall 2017.  iFOB neg 09/2011.  11/2015: tubular adenoma x 1. Recall 5 yrs (11/2020)  . POLYPECTOMY    .  remvoal sebaceous cyst  15-20 years ago  . TONSILLECTOMY     Family History  Problem Relation Age of Onset  . Diabetes Father   . Stroke Father   . Heart attack Mother   . Stroke Other   . Colon cancer Neg Hx   . Colon polyps Neg Hx   . Rectal cancer Neg Hx   . Stomach cancer Neg Hx    Social History   Socioeconomic History   . Marital status: Married    Spouse name: Not on file  . Number of children: Not on file  . Years of education: Not on file  . Highest education level: Not on file  Occupational History  . Not on file  Tobacco Use  . Smoking status: Never Smoker  . Smokeless tobacco: Never Used  Vaping Use  . Vaping Use: Never used  Substance and Sexual Activity  . Alcohol use: Yes    Alcohol/week: 3.0 standard drinks    Types: 3 Shots of liquor per week    Comment: occ  . Drug use: No  . Sexual activity: Yes  Other Topics Concern  . Not on file  Social History Narrative   Married x 40 yrs.   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   No T/A/Ds.   Social Determinants of Health   Financial Resource Strain: Low Risk   . Difficulty of Paying Living Expenses: Not hard at all  Food Insecurity: No Food Insecurity  . Worried About Charity fundraiser in the Last Year: Never true  . Ran Out of Food in the Last Year: Never true  Transportation Needs: No Transportation Needs  . Lack of Transportation (Medical): No  . Lack of Transportation (Non-Medical): No  Physical Activity: Inactive  . Days of Exercise per Week: 0 days  . Minutes of Exercise per Session: 0 min  Stress: No Stress Concern Present  . Feeling of Stress : Not at all  Social Connections: Socially Integrated  . Frequency of Communication with Friends and Family: More than three times a week  . Frequency of Social Gatherings with Friends and Family: More than three times a week  . Attends Religious Services: More than 4 times per year  . Active Member of Clubs or Organizations: Yes  . Attends Archivist Meetings: More than 4 times per year  . Marital Status: Married    Tobacco Counseling Counseling given: Not Answered   Clinical Intake:  Pre-visit preparation completed: Yes  Pain : No/denies pain     Nutritional Status: BMI > 30  Obese Nutritional Risks: None Diabetes: Yes CBG  done?: No Did pt. bring in CBG monitor from home?: No (phone visit)  How often do you need to have someone help you when you read instructions, pamphlets, or other written materials from your doctor or pharmacy?: 1 - Never  Diabetes:  Is the patient diabetic?  Yes  If diabetic, was a CBG obtained today?  No  Did the patient bring in their glucometer from home?  No phone visit How often do you monitor your CBG's? occasionally.   Financial Strains and Diabetes Management:  Are you having any financial strains with the device, your supplies or your medication? No .  Does the patient want to be seen by Chronic Care Management for management of their diabetes?  No  Would the patient like to be referred to a Nutritionist or for Diabetic Management?  No  Diabetic Exams:  Diabetic Eye Exam: Completed 01/14/2020.   Diabetic Foot Exam: Completed 07/03/2019.     Interpreter Needed?: No  Information entered by :: Caroleen Hamman LPN   Activities of Daily Living In your present state of health, do you have any difficulty performing the following activities: 04/13/2020 10/23/2019  Hearing? N N  Vision? N N  Difficulty concentrating or making decisions? N N  Walking or climbing stairs? N N  Dressing or bathing? N N  Doing errands, shopping? N N  Preparing Food and eating ? N -  Using the Toilet? N -  In the past six months, have you accidently leaked urine? N -  Do you have problems with loss of bowel control? N -  Managing your Medications? N -  Managing your Finances? N -  Housekeeping or managing your Housekeeping? N -  Some recent data might be hidden    Patient Care Team: Tammi Sou, MD as PCP - General (Family Medicine) Marica Otter, [place] as Consulting Physician (Optometry) Irene Shipper, MD as Consulting Physician (Gastroenterology) Minus Breeding, MD as Consulting Physician (Cardiology) Paulla Dolly Tamala Fothergill, DPM as Consulting Physician (Podiatry)  Indicate any recent  Medical Services you may have received from other than [facility] providers in the past year (date may be approximate).     Assessment:   This is a routine wellness examination for [place].  Hearing/Vision screen  Hearing Screening   125Hz  250Hz  500Hz  1000Hz  2000Hz  3000Hz  4000Hz  6000Hz  8000Hz   Right ear:           Left ear:           Comments: No issues  Vision Screening Comments: Wears contacts Last eye exam-12/2019-Dr. Sabra Heck  Dietary issues and exercise activities discussed: Current Exercise Habits: The patient does not participate in regular exercise at present, Exercise limited by: None identified  Goals    . Patient Stated     Lose a few pounds      Depression Screen PHQ 2/9 Scores 04/13/2020 10/23/2019 08/26/2018 01/29/2018 11/30/2016 04/14/2015 12/24/2014  PHQ - 2 Score 0 0 0 0 0 0 0    Fall Risk Fall Risk  04/13/2020 10/23/2019 08/26/2018 01/29/2018 01/16/2018  Falls in the past year? 0 1 0 0 0  Comment - - - - Emmi Telephone Survey: data to providers prior to load  Number falls in past yr: 0 0 0 - -  Injury with Fall? 0 0 0 - -  Follow up Falls prevention discussed Falls evaluation completed Falls evaluation completed - -    FALL RISK PREVENTION PERTAINING TO THE HOME:  Any stairs in or around the home? Yes  If so, are there any without handrails? No  Home free of loose throw rugs in walkways, pet beds, electrical cords, etc? Yes  Adequate lighting in your home to reduce risk of falls? Yes   ASSISTIVE DEVICES UTILIZED TO PREVENT FALLS:  Life alert? No  Use of a cane, walker or w/c? No  Grab bars in the bathroom? No  Shower chair or bench in shower? No  Elevated toilet seat or a handicapped toilet? No   TIMED UP AND GO:  Was the test performed? No . Phone visit   Cognitive Function:Normal cognitive status assessed by  this Nurse Health Advisor. No abnormalities found.          Immunizations Immunization History  Administered Date(s) Administered  . DTaP  02/27/2007  . Fluad Quad(high Dose 65+) 11/26/2018  . Hepatitis B  02/27/2007  . IPV 02/27/2007  . Influenza Split 01/23/2011, 11/14/2012  . Influenza, High Dose Seasonal PF 11/09/2015, 11/30/2016, 01/15/2018  . Influenza,inj,Quad PF,6+ Mos 12/25/2013  . Influenza-Unspecified 11/19/2014, 01/20/2020  . PFIZER(Purple Top)SARS-COV-2 Vaccination 03/18/2019, 04/09/2019, 12/28/2019  . Pneumococcal Conjugate-13 11/14/2012  . Pneumococcal Polysaccharide-23 04/14/2015  . Td 01/20/2008, 02/05/2018  . Zoster 04/12/2009  . Zoster Recombinat (Shingrix) 12/26/2016, 04/18/2017    TDAP status: Up to date  Flu Vaccine status: Up to date  Pneumococcal vaccine status: Up to date  Covid-19 vaccine status: Completed vaccines  Qualifies for Shingles Vaccine? No   Zostavax completed Yes   Shingrix Completed?: Yes  Screening Tests Health Maintenance  Topic Date Due  . URINE MICROALBUMIN  11/26/2019  . HEMOGLOBIN A1C  04/24/2020  . FOOT EXAM  07/02/2020  . COLONOSCOPY (Pts 45-39yrs Insurance coverage will need to be confirmed)  11/28/2020  . OPHTHALMOLOGY EXAM  01/13/2021  . TETANUS/TDAP  02/06/2028  . INFLUENZA VACCINE  Completed  . COVID-19 Vaccine  Completed  . Hepatitis C Screening  Completed    Health Maintenance  Health Maintenance Due  Topic Date Due  . URINE MICROALBUMIN  11/26/2019    Colorectal cancer screening: Type of screening: Colonoscopy. Completed 11/29/2015. Repeat every 5 years  Lung Cancer Screening: (Low Dose CT Chest recommended if Age 31-80 years, 30 pack-year currently smoking OR have quit w/in 15years.) does not qualify.     Additional Screening:  Hepatitis C Screening:Completed 03/06/2016  Vision Screening: Recommended annual ophthalmology exams for early detection of glaucoma and other disorders of the eye. Is the patient up to date with their annual eye exam?  Yes  Who is the provider or what is the name of the office in which the patient attends annual eye  exams? Dr. Sabra Heck   Dental Screening: Recommended annual dental exams for proper oral hygiene  Community Resource Referral / Chronic Care Management: CRR required this visit?  No   CCM required this visit?  No      Plan:     I have personally reviewed and noted the following in the patient's chart:   . Medical and social history . Use of alcohol, tobacco or illicit drugs  . Current medications and supplements . Functional ability and status . Nutritional status . Physical activity . Advanced directives . List of other physicians . Hospitalizations, surgeries, and ER visits in previous 12 months . Vitals . Screenings to include cognitive, depression, and falls . Referrals and appointments  In addition, I have reviewed and discussed with patient certain preventive protocols, quality metrics, and best practice recommendations. A written personalized care plan for preventive services as well as general preventive health recommendations were provided to patient.   Due to this being a telephonic visit, the after visit summary with patients personalized plan was offered to patient via mail or my-chart. Patient would like to access on my-chart.   Marta Antu, LPN   [id number]  Nurse Health Advisor  Nurse Notes: None

## 2020-04-13 ENCOUNTER — Ambulatory Visit (INDEPENDENT_AMBULATORY_CARE_PROVIDER_SITE_OTHER): Payer: Medicare Other

## 2020-04-13 VITALS — Ht 65.5 in | Wt 210.0 lb

## 2020-04-13 DIAGNOSIS — Z0000 Encounter for general adult medical examination without abnormal findings: Secondary | ICD-10-CM

## 2020-04-13 NOTE — Patient Instructions (Signed)
Mr. Michael Hodge , Thank you for taking time to complete your Medicare Wellness Visit. I appreciate your ongoing commitment to your health goals. Please review the following plan we discussed and let me know if I can assist you in the future.   Screening recommendations/referrals: Colonoscopy: Completed 11/29/2015-Due 11/28/2020 Recommended yearly ophthalmology/optometry visit for glaucoma screening and checkup Recommended yearly dental visit for hygiene and checkup  Vaccinations: Influenza vaccine: Up to date Pneumococcal vaccine: Completed vaccines Tdap vaccine: Up to date- Due-02/06/2028 Shingles vaccine: Completed vaccines  Covid-19: Completed vaccines  Advanced directives: Please bring a copy for your chart  Conditions/risks identified: See problem list  Next appointment: Follow up in one year for your annual wellness visit. 04/19/2021 @ 2:15  Preventive Care 72 Years and Older, Male Preventive care refers to lifestyle choices and visits with your health care provider that can promote health and wellness. What does preventive care include?  A yearly physical exam. This is also called an annual well check.  Dental exams once or twice a year.  Routine eye exams. Ask your health care provider how often you should have your eyes checked.  Personal lifestyle choices, including:  Daily care of your teeth and gums.  Regular physical activity.  Eating a healthy diet.  Avoiding tobacco and drug use.  Limiting alcohol use.  Practicing safe sex.  Taking low doses of aspirin every day.  Taking vitamin and mineral supplements as recommended by your health care provider. What happens during an annual well check? The services and screenings done by your health care provider during your annual well check will depend on your age, overall health, lifestyle risk factors, and family history of disease. Counseling  Your health care provider may ask you questions about your:  Alcohol  use.  Tobacco use.  Drug use.  Emotional well-being.  Home and relationship well-being.  Sexual activity.  Eating habits.  History of falls.  Memory and ability to understand (cognition).  Work and work Statistician. Screening  You may have the following tests or measurements:  Height, weight, and BMI.  Blood pressure.  Lipid and cholesterol levels. These may be checked every 5 years, or more frequently if you are over 48 years old.  Skin check.  Lung cancer screening. You may have this screening every year starting at age 20 if you have a 30-pack-year history of smoking and currently smoke or have quit within the past 15 years.  Fecal occult blood test (FOBT) of the stool. You may have this test every year starting at age 37.  Flexible sigmoidoscopy or colonoscopy. You may have a sigmoidoscopy every 5 years or a colonoscopy every 10 years starting at age 57.  Prostate cancer screening. Recommendations will vary depending on your family history and other risks.  Hepatitis C blood test.  Hepatitis B blood test.  Sexually transmitted disease (STD) testing.  Diabetes screening. This is done by checking your blood sugar (glucose) after you have not eaten for a while (fasting). You may have this done every 1-3 years.  Abdominal aortic aneurysm (AAA) screening. You may need this if you are a current or former smoker.  Osteoporosis. You may be screened starting at age 39 if you are at high risk. Talk with your health care provider about your test results, treatment options, and if necessary, the need for more tests. Vaccines  Your health care provider may recommend certain vaccines, such as:  Influenza vaccine. This is recommended every year.  Tetanus, diphtheria, and acellular pertussis (  Tdap, Td) vaccine. You may need a Td booster every 10 years.  Zoster vaccine. You may need this after age 33.  Pneumococcal 13-valent conjugate (PCV13) vaccine. One dose is  recommended after age 31.  Pneumococcal polysaccharide (PPSV23) vaccine. One dose is recommended after age 63. Talk to your health care provider about which screenings and vaccines you need and how often you need them. This information is not intended to replace advice given to you by your health care provider. Make sure you discuss any questions you have with your health care provider. Document Released: 03/11/2015 Document Revised: 11/02/2015 Document Reviewed: 12/14/2014 Elsevier Interactive Patient Education  2017 [place] Prevention in the Home Falls can cause injuries. They can happen to people of all ages. There are many things you can do to make your home safe and to help prevent falls. What can I do on the outside of my home?  Regularly fix the edges of walkways and driveways and fix any cracks.  Remove anything that might make you trip as you walk through a door, such as a raised step or threshold.  Trim any bushes or trees on the path to your home.  Use bright outdoor lighting.  Clear any walking paths of anything that might make someone trip, such as rocks or tools.  Regularly check to see if handrails are loose or broken. Make sure that both sides of any steps have handrails.  Any raised decks and porches should have guardrails on the edges.  Have any leaves, snow, or ice cleared regularly.  Use sand or salt on walking paths during winter.  Clean up any spills in your garage right away. This includes oil or grease spills. What can I do in the bathroom?  Use night lights.  Install grab bars by the toilet and in the tub and shower. Do not use towel bars as grab bars.  Use non-skid mats or decals in the tub or shower.  If you need to sit down in the shower, use a plastic, non-slip stool.  Keep the floor dry. Clean up any water that spills on the floor as soon as it happens.  Remove soap buildup in the tub or shower regularly.  Attach bath mats  securely with double-sided non-slip rug tape.  Do not have throw rugs and other things on the floor that can make you trip. What can I do in the bedroom?  Use night lights.  Make sure that you have a light by your bed that is easy to reach.  Do not use any sheets or blankets that are too big for your bed. They should not hang down onto the floor.  Have a firm chair that has side arms. You can use this for support while you get dressed.  Do not have throw rugs and other things on the floor that can make you trip. What can I do in the kitchen?  Clean up any spills right away.  Avoid walking on wet floors.  Keep items that you use a lot in easy-to-reach places.  If you need to reach something above you, use a strong step stool that has a grab bar.  Keep electrical cords out of the way.  Do not use floor polish or wax that makes floors slippery. If you must use wax, use non-skid floor wax.  Do not have throw rugs and other things on the floor that can make you trip. What can I do with my stairs?  Do  not leave any items on the stairs.  Make sure that there are handrails on both sides of the stairs and use them. Fix handrails that are broken or loose. Make sure that handrails are as long as the stairways.  Check any carpeting to make sure that it is firmly attached to the stairs. Fix any carpet that is loose or worn.  Avoid having throw rugs at the top or bottom of the stairs. If you do have throw rugs, attach them to the floor with carpet tape.  Make sure that you have a light switch at the top of the stairs and the bottom of the stairs. If you do not have them, ask someone to add them for you. What else can I do to help prevent falls?  Wear shoes that:  Do not have high heels.  Have rubber bottoms.  Are comfortable and fit you well.  Are closed at the toe. Do not wear sandals.  If you use a stepladder:  Make sure that it is fully opened. Do not climb a closed  stepladder.  Make sure that both sides of the stepladder are locked into place.  Ask someone to hold it for you, if possible.  Clearly mark and make sure that you can see:  Any grab bars or handrails.  First and last steps.  Where the edge of each step is.  Use tools that help you move around (mobility aids) if they are needed. These include:  Canes.  Walkers.  Scooters.  Crutches.  Turn on the lights when you go into a dark area. Replace any light bulbs as soon as they burn out.  Set up your furniture so you have a clear path. Avoid moving your furniture around.  If any of your floors are uneven, fix them.  If there are any pets around you, be aware of where they are.  Review your medicines with your doctor. Some medicines can make you feel dizzy. This can increase your chance of falling. Ask your doctor what other things that you can do to help prevent falls. This information is not intended to replace advice given to you by your health care provider. Make sure you discuss any questions you have with your health care provider. Document Released: 12/09/2008 Document Revised: 07/21/2015 Document Reviewed: 03/19/2014 Elsevier Interactive Patient Education  2017 [employer].

## 2020-04-25 ENCOUNTER — Encounter: Payer: Medicare Other | Admitting: Family Medicine

## 2020-05-12 ENCOUNTER — Encounter: Payer: Self-pay | Admitting: Podiatry

## 2020-05-12 ENCOUNTER — Other Ambulatory Visit: Payer: Self-pay

## 2020-05-12 ENCOUNTER — Ambulatory Visit (INDEPENDENT_AMBULATORY_CARE_PROVIDER_SITE_OTHER): Payer: Medicare Other | Admitting: Podiatry

## 2020-05-12 DIAGNOSIS — M2041 Other hammer toe(s) (acquired), right foot: Secondary | ICD-10-CM

## 2020-05-12 DIAGNOSIS — M216X9 Other acquired deformities of unspecified foot: Secondary | ICD-10-CM | POA: Diagnosis not present

## 2020-05-15 NOTE — Progress Notes (Signed)
Subjective:   Patient ID: Michael Hodge, male   DOB: 69 y.o.   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  I recommended digital fusion along with shortening osteotomy screw fixation and I explained procedure risk to patient.  I do not think the second toe will necessarily contact the surface but will be straight which will give him relief of the pain he experiences I explained all this to him and he signed consent form after extensive review and is scheduled for outpatient surgery  X-rays indicate rigid contracture digit to right with elongated second metatarsal segment

## 2020-05-23 MED ORDER — HYDROCODONE-ACETAMINOPHEN 10-325 MG PO TABS: 1.0000 | ORAL_TABLET | Freq: Three times a day (TID) | ORAL | 0 refills | Status: AC | PRN

## 2020-05-23 NOTE — Addendum Note (Signed)
Addended by: Wallene Huh on: 05/23/2020 02:00 PM   Modules accepted: Orders

## 2020-05-24 ENCOUNTER — Encounter: Payer: Self-pay | Admitting: Podiatry

## 2020-05-24 DIAGNOSIS — M2041 Other hammer toe(s) (acquired), right foot: Secondary | ICD-10-CM | POA: Diagnosis not present

## 2020-05-24 DIAGNOSIS — M21541 Acquired clubfoot, right foot: Secondary | ICD-10-CM | POA: Diagnosis not present

## 2020-05-24 DIAGNOSIS — Q6689 Other  specified congenital deformities of feet: Secondary | ICD-10-CM | POA: Diagnosis not present

## 2020-05-24 HISTORY — PX: HAMMER TOE SURGERY: SHX385

## 2020-05-26 DIAGNOSIS — L570 Actinic keratosis: Secondary | ICD-10-CM | POA: Diagnosis not present

## 2020-05-26 DIAGNOSIS — D2261 Melanocytic nevi of right upper limb, including shoulder: Secondary | ICD-10-CM | POA: Diagnosis not present

## 2020-05-26 DIAGNOSIS — D1801 Hemangioma of skin and subcutaneous tissue: Secondary | ICD-10-CM | POA: Diagnosis not present

## 2020-05-26 DIAGNOSIS — D225 Melanocytic nevi of trunk: Secondary | ICD-10-CM | POA: Diagnosis not present

## 2020-05-26 DIAGNOSIS — Z85828 Personal history of other malignant neoplasm of skin: Secondary | ICD-10-CM | POA: Diagnosis not present

## 2020-05-26 DIAGNOSIS — L814 Other melanin hyperpigmentation: Secondary | ICD-10-CM | POA: Diagnosis not present

## 2020-05-26 DIAGNOSIS — D2262 Melanocytic nevi of left upper limb, including shoulder: Secondary | ICD-10-CM | POA: Diagnosis not present

## 2020-05-26 DIAGNOSIS — L821 Other seborrheic keratosis: Secondary | ICD-10-CM | POA: Diagnosis not present

## 2020-05-30 ENCOUNTER — Encounter: Payer: Medicare Other | Admitting: Podiatry

## 2020-05-30 ENCOUNTER — Other Ambulatory Visit: Payer: Self-pay

## 2020-05-30 NOTE — Progress Notes (Signed)
Office Note 05/31/2020  CC:  Chief Complaint  Patient presents with  . Follow-up    RCI, fasting    HPI:  Michael Hodge is a 70 y.o. White male who is here for 8 mo f/u DM 2, HTN, and HLD. A/P as of last visit: "1) DM 2, good response to metformin. Continue 500mg  q supper."  INTERIM HX: Doing well, got R foot 2nd toe hammertoe surgery last week and is doing well. Has a beach house. Goes to [employer] a lot as well, enjoys quail hunting. Exercise: some walking but not consistently.  Historically has been limited some by bilat knee stiffness, not so much pain.  Diclofenac gel helps some.  DM: occ home gluc monitoring.  Last one was 143 2H PP.   Feet->any burning, tingling, or numbness?->no.  HTN:  occ home bp check, consistently <130/80  HLD: tolerating atorva 40mg  qd. Making better food choices, eating smaller portion size.  Past Medical History:  Diagnosis Date  . Allergy   . Arthritis   . CAD (coronary artery disease)    CT demonstrated nonobstructive plaques (this test was prompted by borderline ETT.  . Diabetes mellitus without complication ([place]) dx'd 07/3147   Fasting gluc 136 and HbA1c 6.7% at diagnosis.   . Diverticulosis   . GERD (gastroesophageal reflux disease)    occ  . Hx of adenomatous colonic polyps   . Hyperlipidemia   . Hypertension    w/hypertensive retinopathy  . Internal hemorrhoids   . Obesity, Class II, BMI 35-39.9     Past Surgical History:  Procedure Laterality Date  . CARDIOVASCULAR STRESS TEST  02/2013   LexiScan/low-level exercise-Myoview (02/2013): No ischemia, EF 53%, occ symptomatic PVCs.  . COLONOSCOPY    . COLONOSCOPY W/ POLYPECTOMY  2005;08/21/10;11/29/15   adenomatous; also mild diverticulosis; recall 2017.  iFOB neg 09/2011.  11/2015: tubular adenoma x 1. Recall 5 yrs (11/2020)  . HAMMER TOE SURGERY Right 05/24/2020  . POLYPECTOMY    . remvoal sebaceous cyst  15-20 years ago  . TONSILLECTOMY      Family History  Problem  Relation Age of Onset  . Diabetes Father   . Stroke Father   . Heart attack Mother   . Stroke Other   . Colon cancer Neg Hx   . Colon polyps Neg Hx   . Rectal cancer Neg Hx   . Stomach cancer Neg Hx     Social History   Socioeconomic History  . Marital status: Married    Spouse name: Not on file  . Number of children: Not on file  . Years of education: Not on file  . Highest education level: Not on file  Occupational History  . Not on file  Tobacco Use  . Smoking status: Never Smoker  . Smokeless tobacco: Never Used  Vaping Use  . Vaping Use: Never used  Substance and Sexual Activity  . Alcohol use: Yes    Alcohol/week: 3.0 standard drinks    Types: 3 Shots of liquor per week    Comment: occ  . Drug use: No  . Sexual activity: Yes  Other Topics Concern  . Not on file  Social History Narrative   Married x 40 yrs.   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   No T/A/Ds.   Social Determinants of Health   Financial Resource Strain: Low Risk   . Difficulty of Paying Living Expenses: Not hard at all  Food Insecurity: No  Food Insecurity  . Worried About Charity fundraiser in the Last Year: Never true  . Ran Out of Food in the Last Year: Never true  Transportation Needs: No Transportation Needs  . Lack of Transportation (Medical): No  . Lack of Transportation (Non-Medical): No  Physical Activity: Inactive  . Days of Exercise per Week: 0 days  . Minutes of Exercise per Session: 0 min  Stress: No Stress Concern Present  . Feeling of Stress : Not at all  Social Connections: Socially Integrated  . Frequency of Communication with Friends and Family: More than three times a week  . Frequency of Social Gatherings with Friends and Family: More than three times a week  . Attends Religious Services: More than 4 times per year  . Active Member of Clubs or Organizations: Yes  . Attends Archivist Meetings: More than 4 times per year  .  Marital Status: Married  Human resources officer Violence: Not At Risk  . Fear of Current or Ex-Partner: No  . Emotionally Abused: No  . Physically Abused: No  . Sexually Abused: No    Outpatient Medications Prior to Visit  Medication Sig Dispense Refill  . aspirin 81 MG tablet Take 81 mg by mouth daily.    Marland Kitchen atorvastatin (LIPITOR) 40 MG tablet Take 1 tablet (40 mg total) by mouth daily. 90 tablet 3  . diclofenac (VOLTAREN) 75 MG EC tablet TAKE ONE TABLET BY MOUTH TWICE A DAY AS NEEDED 60 tablet 4  . glucose blood (FREESTYLE LITE) test strip USE AS INSTRUCTED 50 each 3  . hydrochlorothiazide (HYDRODIURIL) 25 MG tablet Take 1 tablet (25 mg total) by mouth daily. 90 tablet 3  . metFORMIN (GLUCOPHAGE) 500 MG tablet Take 1 tablet (500 mg total) by mouth daily with supper. 90 tablet 3  . potassium chloride SA (KLOR-CON M20) 20 MEQ tablet Take 2 tablets (40 mEq total) by mouth 2 (two) times daily. 360 tablet 3   No facility-administered medications prior to visit.    No Known Allergies  ROS Review of Systems  Constitutional: Negative for appetite change, chills, fatigue and fever.  HENT: Negative for congestion, dental problem, ear pain and sore throat.   Eyes: Negative for discharge, redness and visual disturbance.  Respiratory: Negative for cough, chest tightness, shortness of breath and wheezing.   Cardiovascular: Negative for chest pain, palpitations and leg swelling.  Gastrointestinal: Negative for abdominal pain, blood in stool, diarrhea, nausea and vomiting.  Genitourinary: Negative for difficulty urinating, dysuria, flank pain, frequency, hematuria and urgency.  Musculoskeletal: Negative for arthralgias, back pain, joint swelling, myalgias and neck stiffness.  Skin: Negative for pallor and rash.  Neurological: Negative for dizziness, speech difficulty, weakness and headaches.  Hematological: Negative for adenopathy. Does not bruise/bleed easily.  Psychiatric/Behavioral: Negative for  confusion and sleep disturbance. The patient is not nervous/anxious.     PE; Vitals with BMI 05/31/2020 04/13/2020 10/23/2019  Height 5' 5.5" 5' 5.5" 5' 5.5"  Weight 198 lbs 6 oz 210 lbs 205 lbs 6 oz  BMI 32.5 55.7 32.20  Systolic 254 - 270  Diastolic 75 - 80  Pulse 59 - 50   Gen: Alert, well appearing.  Patient is oriented to person, place, time, and situation. AFFECT: pleasant, lucid thought and speech. ENT: Ears: EACs clear, normal epithelium.  TMs with good light reflex and landmarks bilaterally.  Eyes: no injection, icteris, swelling, or exudate.  EOMI, PERRLA. Nose: no drainage or turbinate edema/swelling.  No injection or focal lesion.  Mouth: lips without lesion/swelling.  Oral mucosa pink and moist.  Dentition intact and without obvious caries or gingival swelling.  Oropharynx without erythema, exudate, or swelling.  Neck: supple/nontender.  No LAD, mass, or TM.  Carotid pulses 2+ bilaterally, without bruits. CV: RRR, no m/r/g.   LUNGS: CTA bilat, nonlabored resps, good aeration in all lung fields. ABD: soft, NT, ND, BS normal.  No hepatospenomegaly or mass.  No bruits. EXT: no clubbing, cyanosis, or edema.  Musculoskeletal: no joint swelling, erythema, warmth, or tenderness.  ROM of all joints intact. Skin - no sores or suspicious lesions or rashes or color changes Foot exam - no swelling, tenderness or skin or vascular lesions. Color and temperature is normal. Sensation is intact. Peripheral pulses are palpable. Toenails are normal.   Pertinent labs:  Lab Results  Component Value Date   TSH 3.12 07/31/2017   Lab Results  Component Value Date   WBC 6.0 09/09/2017   HGB 14.3 09/09/2017   HCT 43 09/09/2017   MCV 90.4 07/31/2017   PLT 181 09/09/2017   Lab Results  Component Value Date   CREATININE 0.97 07/03/2019   BUN 24 (H) 07/03/2019   NA 138 07/03/2019   K 3.6 07/03/2019   CL 100 07/03/2019   CO2 31 07/03/2019   Lab Results  Component Value Date   ALT 24  07/03/2019   AST 27 07/03/2019   ALKPHOS 66 07/03/2019   BILITOT 1.2 07/03/2019   Lab Results  Component Value Date   CHOL 96 07/03/2019   Lab Results  Component Value Date   HDL 38.00 (L) 07/03/2019   Lab Results  Component Value Date   LDLCALC 42 07/03/2019   Lab Results  Component Value Date   TRIG 83.0 07/03/2019   Lab Results  Component Value Date   CHOLHDL 3 07/03/2019   Lab Results  Component Value Date   PSA 1.36 10/14/2018   PSA 0.80 07/31/2017   PSA 1.93 03/06/2016   Lab Results  Component Value Date   HGBA1C 6.4 (A) 10/23/2019   HGBA1C 6.4 10/23/2019   HGBA1C 6.4 10/23/2019   HGBA1C 6.4 10/23/2019   ASSESSMENT AND PLAN:   1) DM: cont metformin 500mg  qd. Hba1c, lytes/cr, urine microalb/cr today. Feet exam today.  2) HTN: stable, cont hctz 25 qd. Lytes/cr today.  3) HLD: tolerating atorva 40mg  qd. FLP and hepatic panel today.  4) Preventative healthcare: Vaccines: ALL UTD. Labs: cmet, cbc, flp, a1c, psa Prostate ca screening: PSA today. Colon ca screening: next colonoscopy due 11/2020. Hep c screen neg 2018. Lung ca screening: does not qualify. AAA screening: does not qualify.  An After Visit Summary was printed and given to the patient.  FOLLOW UP:  Return in about 6 months (around 11/30/2020) for routine chronic illness f/u.  Signed:  Crissie Sickles, MD           05/31/2020

## 2020-05-31 ENCOUNTER — Ambulatory Visit (INDEPENDENT_AMBULATORY_CARE_PROVIDER_SITE_OTHER): Payer: Medicare Other | Admitting: Family Medicine

## 2020-05-31 ENCOUNTER — Encounter: Payer: Self-pay | Admitting: Family Medicine

## 2020-05-31 VITALS — BP 127/75 | HR 59 | Temp 97.4°F | Resp 16 | Ht 65.5 in | Wt 198.4 lb

## 2020-05-31 DIAGNOSIS — Z125 Encounter for screening for malignant neoplasm of prostate: Secondary | ICD-10-CM | POA: Diagnosis not present

## 2020-05-31 DIAGNOSIS — E119 Type 2 diabetes mellitus without complications: Secondary | ICD-10-CM

## 2020-05-31 DIAGNOSIS — Z0000 Encounter for general adult medical examination without abnormal findings: Secondary | ICD-10-CM

## 2020-05-31 DIAGNOSIS — E7800 Pure hypercholesterolemia, unspecified: Secondary | ICD-10-CM | POA: Diagnosis not present

## 2020-05-31 DIAGNOSIS — I10 Essential (primary) hypertension: Secondary | ICD-10-CM

## 2020-05-31 LAB — LIPID PANEL
Cholesterol: 93 mg/dL (ref 0–200)
HDL: 40.8 mg/dL (ref >39.00)
LDL Cholesterol: 32 mg/dL (ref 0–99)
NonHDL: 52.5
Total CHOL/HDL Ratio: 2
Triglycerides: 101 mg/dL (ref 0.0–149.0)
VLDL: 20.2 mg/dL (ref 0.0–40.0)

## 2020-05-31 LAB — COMPREHENSIVE METABOLIC PANEL
ALT: 23 U/L (ref 0–53)
AST: 22 U/L (ref 0–37)
Albumin: 4.6 g/dL (ref 3.5–5.2)
Alkaline Phosphatase: 67 U/L (ref 39–117)
BUN: 24 mg/dL — ABNORMAL HIGH (ref 6–23)
CO2: 28 mEq/L (ref 19–32)
Calcium: 10.1 mg/dL (ref 8.4–10.5)
Chloride: 99 mEq/L (ref 96–112)
Creatinine, Ser: 1.05 mg/dL (ref 0.40–1.50)
GFR: 71.44 mL/min (ref >60.00)
Glucose, Bld: 124 mg/dL — ABNORMAL HIGH (ref 70–99)
Potassium: 3.4 mEq/L — ABNORMAL LOW (ref 3.5–5.1)
Sodium: 138 mEq/L (ref 135–145)
Total Bilirubin: 1.3 mg/dL — ABNORMAL HIGH (ref 0.2–1.2)
Total Protein: 7.2 g/dL (ref 6.0–8.3)

## 2020-05-31 LAB — CBC WITH DIFFERENTIAL/PLATELET
Basophils Absolute: 0.1 10*3/uL (ref 0.0–0.1)
Basophils Relative: 0.8 % (ref 0.0–3.0)
Eosinophils Absolute: 0.1 10*3/uL (ref 0.0–0.7)
Eosinophils Relative: 1.7 % (ref 0.0–5.0)
HCT: 43.7 % (ref 39.0–52.0)
Hemoglobin: 15.1 g/dL (ref 13.0–17.0)
Lymphocytes Relative: 26.7 % (ref 12.0–46.0)
Lymphs Abs: 1.7 10*3/uL (ref 0.7–4.0)
MCHC: 34.5 g/dL (ref 30.0–36.0)
MCV: 89.7 fl (ref 78.0–100.0)
Monocytes Absolute: 0.8 10*3/uL (ref 0.1–1.0)
Monocytes Relative: 12 % (ref 3.0–12.0)
Neutro Abs: 3.8 10*3/uL (ref 1.4–7.7)
Neutrophils Relative %: 58.8 % (ref 43.0–77.0)
Platelets: 196 10*3/uL (ref 150.0–400.0)
RBC: 4.87 Mil/uL (ref 4.22–5.81)
RDW: 13.9 % (ref 11.5–15.5)
WBC: 6.4 10*3/uL (ref 4.0–10.5)

## 2020-05-31 LAB — PSA, MEDICARE: PSA: 0.89 ng/ml (ref 0.10–4.00)

## 2020-05-31 LAB — MICROALBUMIN / CREATININE URINE RATIO
Creatinine,U: 114.7 mg/dL
Microalb Creat Ratio: 0.6 mg/g (ref 0.0–30.0)
Microalb, Ur: <0.7 mg/dL (ref 0.0–1.9)

## 2020-05-31 LAB — HEMOGLOBIN A1C: Hgb A1c MFr Bld: 7 % — ABNORMAL HIGH (ref 4.6–6.5)

## 2020-05-31 NOTE — Patient Instructions (Signed)

## 2020-06-01 ENCOUNTER — Other Ambulatory Visit: Payer: Self-pay

## 2020-06-01 ENCOUNTER — Ambulatory Visit (INDEPENDENT_AMBULATORY_CARE_PROVIDER_SITE_OTHER): Payer: Medicare Other

## 2020-06-01 ENCOUNTER — Encounter: Payer: Self-pay | Admitting: Podiatry

## 2020-06-01 ENCOUNTER — Ambulatory Visit (INDEPENDENT_AMBULATORY_CARE_PROVIDER_SITE_OTHER): Payer: Medicare Other | Admitting: Podiatry

## 2020-06-01 DIAGNOSIS — M2041 Other hammer toe(s) (acquired), right foot: Secondary | ICD-10-CM

## 2020-06-01 MED ORDER — METFORMIN HCL 500 MG PO TABS: ORAL_TABLET | ORAL | 1 refills | Status: DC

## 2020-06-01 NOTE — Progress Notes (Signed)
Subjective:   Patient ID: Michael Hodge, male   DOB: 70 y.o.   MRN: [id number]   HPI Patient states doing very well with surgery very pleased and having only mild discomfort   ROS      Objective:  Physical Exam  Neurovascular status intact negative Bevelyn Buckles' sign noted second digit good alignment second metatarsal good alignment wound edges well coapted pins in place     Assessment:  Doing well post osteotomy digital fusion right     Plan:  Reviewed condition x-ray advised on continued elevation compression immobilization reapplied sterile dressing reappoint 4 weeks pin removal earlier if needed  X-rays indicate osteotomies healing well fixation in place digit in good alignment fixation in place

## 2020-06-20 ENCOUNTER — Other Ambulatory Visit: Payer: Self-pay | Admitting: Family Medicine

## 2020-06-24 ENCOUNTER — Ambulatory Visit (INDEPENDENT_AMBULATORY_CARE_PROVIDER_SITE_OTHER): Payer: Medicare Other | Admitting: Podiatry

## 2020-06-24 ENCOUNTER — Other Ambulatory Visit: Payer: Self-pay

## 2020-06-24 ENCOUNTER — Ambulatory Visit (INDEPENDENT_AMBULATORY_CARE_PROVIDER_SITE_OTHER): Payer: Medicare Other

## 2020-06-24 ENCOUNTER — Encounter: Payer: Self-pay | Admitting: Podiatry

## 2020-06-24 DIAGNOSIS — M2041 Other hammer toe(s) (acquired), right foot: Secondary | ICD-10-CM

## 2020-06-24 NOTE — Progress Notes (Signed)
Subjective:   Patient ID: Michael Hodge, male   DOB: 70 y.o.   MRN: [id number]   HPI Patient states he is healing well very pleased with how he is doing   ROS      Objective:  Physical Exam  Neurovascular status intact negative Bevelyn Buckles' sign noted pins in place second digit right good alignment second digit second metatarsal     Assessment:  Doing well post digital fusion osteotomy second right     Plan:  H&P pin removal sterile dressing applied reviewed x-rays dispensed ankle compression stocking begin gradual shoe gear usage and reappoint as needed  X-ray indicates osteotomy and digit are healing very well good alignment noted

## 2020-07-11 DIAGNOSIS — Z23 Encounter for immunization: Secondary | ICD-10-CM | POA: Diagnosis not present

## 2020-07-21 DIAGNOSIS — Z20822 Contact with and (suspected) exposure to covid-19: Secondary | ICD-10-CM | POA: Diagnosis not present

## 2020-07-25 DIAGNOSIS — Z20822 Contact with and (suspected) exposure to covid-19: Secondary | ICD-10-CM | POA: Diagnosis not present

## 2020-07-27 ENCOUNTER — Encounter: Payer: Self-pay | Admitting: Family Medicine

## 2020-07-27 DIAGNOSIS — K529 Noninfective gastroenteritis and colitis, unspecified: Secondary | ICD-10-CM

## 2020-07-27 DIAGNOSIS — K760 Fatty (change of) liver, not elsewhere classified: Secondary | ICD-10-CM

## 2020-07-27 HISTORY — DX: Fatty (change of) liver, not elsewhere classified: K76.0

## 2020-07-27 HISTORY — DX: Noninfective gastroenteritis and colitis, unspecified: K52.9

## 2020-07-27 MED ORDER — NIRMATRELVIR/RITONAVIR (PAXLOVID)TABLET: 3.0000 | ORAL_TABLET | Freq: Two times a day (BID) | ORAL | 0 refills | Status: AC

## 2020-07-27 MED ORDER — NIRMATRELVIR/RITONAVIR (PAXLOVID)TABLET: 3.0000 | ORAL_TABLET | Freq: Two times a day (BID) | ORAL | 0 refills | Status: DC

## 2020-07-27 NOTE — Telephone Encounter (Signed)
OK, I sent paxlovid to the pharmacy you requested.  I recommend you stop your atorvastatin while you are taking the paxlovid. Hope you feel better soon. --PM

## 2020-07-27 NOTE — Telephone Encounter (Signed)
OK, paxlovid sent (with GFR on rx) to [facility] [place], [place] per pt request.

## 2020-07-27 NOTE — Telephone Encounter (Signed)
Contacted [facility] in [place] regarding antiviral medication, they would prefer pt's GFR be included in sig.   Please Advise. Medication pending

## 2020-08-04 ENCOUNTER — Other Ambulatory Visit: Payer: Self-pay | Admitting: Family Medicine

## 2020-08-06 DIAGNOSIS — I10 Essential (primary) hypertension: Secondary | ICD-10-CM | POA: Diagnosis not present

## 2020-08-06 DIAGNOSIS — Z8616 Personal history of COVID-19: Secondary | ICD-10-CM | POA: Diagnosis not present

## 2020-08-06 DIAGNOSIS — E119 Type 2 diabetes mellitus without complications: Secondary | ICD-10-CM | POA: Diagnosis not present

## 2020-08-06 DIAGNOSIS — Z8719 Personal history of other diseases of the digestive system: Secondary | ICD-10-CM | POA: Diagnosis not present

## 2020-08-06 DIAGNOSIS — K529 Noninfective gastroenteritis and colitis, unspecified: Secondary | ICD-10-CM | POA: Diagnosis not present

## 2020-08-06 DIAGNOSIS — K6389 Other specified diseases of intestine: Secondary | ICD-10-CM | POA: Diagnosis not present

## 2020-08-06 DIAGNOSIS — K760 Fatty (change of) liver, not elsewhere classified: Secondary | ICD-10-CM | POA: Diagnosis not present

## 2020-08-06 HISTORY — DX: Personal history of other diseases of the digestive system: Z87.19

## 2020-08-06 LAB — PROTIME-INR: INR: 1.1 (ref 0.9–1.1)

## 2020-08-08 ENCOUNTER — Encounter: Payer: Self-pay | Admitting: Family Medicine

## 2020-08-08 NOTE — Telephone Encounter (Signed)
OK to wait until June 24th appt to see me. No need to see GI before that. Pls get records from the hospital he noted in his message-thx

## 2020-08-11 ENCOUNTER — Encounter: Payer: Self-pay | Admitting: Family Medicine

## 2020-08-19 ENCOUNTER — Ambulatory Visit (INDEPENDENT_AMBULATORY_CARE_PROVIDER_SITE_OTHER): Payer: Medicare Other | Admitting: Family Medicine

## 2020-08-19 ENCOUNTER — Encounter: Payer: Self-pay | Admitting: Family Medicine

## 2020-08-19 ENCOUNTER — Other Ambulatory Visit: Payer: Self-pay

## 2020-08-19 VITALS — BP 113/66 | HR 71 | Temp 97.4°F | Resp 16 | Ht 65.5 in | Wt 198.2 lb

## 2020-08-19 DIAGNOSIS — M25562 Pain in left knee: Secondary | ICD-10-CM

## 2020-08-19 DIAGNOSIS — M25561 Pain in right knee: Secondary | ICD-10-CM | POA: Diagnosis not present

## 2020-08-19 DIAGNOSIS — G8929 Other chronic pain: Secondary | ICD-10-CM

## 2020-08-19 DIAGNOSIS — M25661 Stiffness of right knee, not elsewhere classified: Secondary | ICD-10-CM | POA: Diagnosis not present

## 2020-08-19 DIAGNOSIS — K529 Noninfective gastroenteritis and colitis, unspecified: Secondary | ICD-10-CM

## 2020-08-19 DIAGNOSIS — M25662 Stiffness of left knee, not elsewhere classified: Secondary | ICD-10-CM

## 2020-08-19 DIAGNOSIS — Z23 Encounter for immunization: Secondary | ICD-10-CM | POA: Diagnosis not present

## 2020-08-19 NOTE — Progress Notes (Signed)
OFFICE VISIT  08/19/2020  CC:  Chief Complaint  Patient presents with   Follow-up    Recent GI illness; [facility]   HPI:    Patient is a 70 y.o. Caucasian male who presents for f/u recent GI illness, was out of town and went to an ED (08/06/20--13 d/a) and CT abd showed colitis.   He was treated with a 7 day course of augmentin.  I reviewed pt's CT abd report prior today's visit ("long segment circumferential wall thickening of descending colon").  His CBC was normal except WBCs were 15.4K, slight inc ANC, cmet normal except sodium mildly low at 132.  Glucose was 139.  Of note, I treated him with paxlovid on 07/27/20 for covid+ illness.  INTERIM HX: Feeling back to his baseline state of health for the last 5-6 days. Appetite good, no diarrhea.  Diet back to normal. (Pt described his sx's 08/06/20 were acute n/v/d, eventually saw BRB on tissue and some in toilet water.  No abd pain or fever but lots of gas/bloating).  He's expecting his first grandchild soon, daughter really wants him to get Tdap vaccine. He had Td 2019, has never had Tdap.  Has chronic bilat knee pain, worse last couple months with going up and down stairs a lot. No swelling or redness.  Seems to be more localized to front of knees, says not really much pain at all, just stiffness, some feeling that knees are weak when going down stairs.  ROS as above, plus--> no fevers, no CP, no SOB, no wheezing, no cough, no dizziness, no HAs, no rashes, no melena/hematochezia.  No polyuria or polydipsia.  No myalgias.  No focal weakness, paresthesias, or tremors.  No acute vision or hearing abnormalities.  No dysuria or unusual/new urinary urgency or frequency.  No recent changes in lower legs. No n/v/d or abd pain.  No palpitations.     Past Medical History:  Diagnosis Date   Allergy    Arthritis    CAD (coronary artery disease)    CT demonstrated nonobstructive plaques (this test was prompted by borderline ETT.    Diabetes mellitus without complication ([place]) dx'd 07/1441   Fasting gluc 136 and HbA1c 6.7% at diagnosis.    Diverticulosis    GERD (gastroesophageal reflux disease)    occ   Hepatic steatosis 07/2020   noted on CT abd/pelv done for GE illness   History of colitis 08/06/2020   presumed infectious. CT abd/pelv w/contrast at [facility]->long segment circumferential wall thickening of descending colon   Hx of adenomatous colonic polyps    Hyperlipidemia    Hypertension    w/hypertensive retinopathy   Internal hemorrhoids    Obesity, Class II, BMI 35-39.9     Past Surgical History:  Procedure Laterality Date   CARDIOVASCULAR STRESS TEST  02/2013   LexiScan/low-level exercise-Myoview (02/2013): No ischemia, EF 53%, occ symptomatic PVCs.   COLONOSCOPY     COLONOSCOPY W/ POLYPECTOMY  2005;08/21/10;11/29/15   adenomatous; also mild diverticulosis; recall 2017.  iFOB neg 09/2011.  11/2015: tubular adenoma x 1. Recall 5 yrs (11/2020)   HAMMER TOE SURGERY Right 05/24/2020   POLYPECTOMY     remvoal sebaceous cyst  15-20 years ago   TONSILLECTOMY      Outpatient Medications Prior to Visit  Medication Sig Dispense Refill   atorvastatin (LIPITOR) 40 MG tablet Take 1 tablet (40 mg total) by mouth daily. 90 tablet 3   diclofenac (VOLTAREN) 75 MG EC tablet TAKE ONE  TABLET BY MOUTH TWICE A DAY AS NEEDED 60 tablet 4   glucose blood (FREESTYLE LITE) test strip USE AS INSTRUCTED 50 each 3   hydrochlorothiazide (HYDRODIURIL) 25 MG tablet TAKE ONE TABLET BY MOUTH DAILY 90 tablet 1   KLOR-CON M20 20 MEQ tablet TAKE TWO TABLETS BY MOUTH TWICE A DAY 360 tablet 0   metFORMIN (GLUCOPHAGE) 500 MG tablet Take 1 tablet by mouth twice daily with supper. 180 tablet 1   aspirin 81 MG tablet Take 81 mg by mouth daily. (Patient not taking: Reported on 08/19/2020)     No facility-administered medications prior to visit.    No Known Allergies  ROS As per HPI  PE: Vitals with BMI 08/19/2020 05/31/2020 04/13/2020   Height 5' 5.5" 5' 5.5" 5' 5.5"  Weight 198 lbs 3 oz 198 lbs 6 oz 210 lbs  BMI 32.47 67.2 09.4  Systolic 709 628 -  Diastolic 66 75 -  Pulse 71 59 -    Gen: Alert, well appearing.  Patient is oriented to person, place, time, and situation. AFFECT: pleasant, lucid thought and speech. Knee exam - no erythema. full range of motion, no pain on motion, no effusion, tenderness except mild patellar tendon tenderness bilat.  No masses, ligamentous instability, or deformity noted.   LABS:  Lab Results  Component Value Date   TSH 3.12 07/31/2017   Lab Results  Component Value Date   WBC 6.4 05/31/2020   HGB 15.1 05/31/2020   HCT 43.7 05/31/2020   MCV 89.7 05/31/2020   PLT 196.0 05/31/2020   Lab Results  Component Value Date   CREATININE 1.05 05/31/2020   BUN 24 (H) 05/31/2020   NA 138 05/31/2020   K 3.4 (L) 05/31/2020   CL 99 05/31/2020   CO2 28 05/31/2020   Lab Results  Component Value Date   ALT 23 05/31/2020   AST 22 05/31/2020   ALKPHOS 67 05/31/2020   BILITOT 1.3 (H) 05/31/2020   Lab Results  Component Value Date   CHOL 93 05/31/2020   Lab Results  Component Value Date   HDL 40.80 05/31/2020   Lab Results  Component Value Date   LDLCALC 32 05/31/2020   Lab Results  Component Value Date   TRIG 101.0 05/31/2020   Lab Results  Component Value Date   CHOLHDL 2 05/31/2020   Lab Results  Component Value Date   PSA 0.89 05/31/2020   PSA 1.36 10/14/2018   PSA 0.80 07/31/2017   Lab Results  Component Value Date   HGBA1C 7.0 (H) 05/31/2020    IMPRESSION AND PLAN:  1) Gastroenteritis illness, CT showed Colitis, suspect infectious.  Completely resolved s/p augmentin.  No labs need repeating.  2) Bilat knee pain: exam more c/w patellar tendonitis.  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 I'll refer to ortho for their opinion.  Pt is in favor of this. In the meantime, continue  diclofenac 75mg  bid prn.  3) Need for Tdap-->Tdap given today.  An After Visit Summary was printed and given to the patient.  FOLLOW UP: Return for keep appt already arranged for 11/30/20. 11/30/20 [facility] f/u appt already set  Signed:  Crissie Sickles, MD           08/19/2020

## 2020-09-05 DIAGNOSIS — M25561 Pain in right knee: Secondary | ICD-10-CM | POA: Diagnosis not present

## 2020-09-05 DIAGNOSIS — M25562 Pain in left knee: Secondary | ICD-10-CM | POA: Diagnosis not present

## 2020-09-15 ENCOUNTER — Encounter: Payer: Self-pay | Admitting: Family Medicine

## 2020-09-21 ENCOUNTER — Other Ambulatory Visit: Payer: Self-pay | Admitting: Family Medicine

## 2020-11-30 ENCOUNTER — Other Ambulatory Visit: Payer: Self-pay | Admitting: Family Medicine

## 2020-11-30 ENCOUNTER — Ambulatory Visit: Payer: Medicare Other | Admitting: Family Medicine

## 2020-12-07 ENCOUNTER — Telehealth: Payer: Medicare Other | Admitting: Family

## 2020-12-07 DIAGNOSIS — J019 Acute sinusitis, unspecified: Secondary | ICD-10-CM | POA: Diagnosis not present

## 2020-12-07 MED ORDER — AMOXICILLIN-POT CLAVULANATE 875-125 MG PO TABS: 1.0000 | ORAL_TABLET | Freq: Two times a day (BID) | ORAL | 0 refills | Status: DC

## 2020-12-07 NOTE — Patient Instructions (Signed)

## 2020-12-07 NOTE — Progress Notes (Signed)
Virtual Visit Consent   MATIN MATTIOLI, you are scheduled for a virtual visit with a [facility] provider today.     Just as with appointments in the office, your consent must be obtained to participate.  Your consent will be active for this visit and any virtual visit you may have with one of our providers in the next 365 days.     If you have a MyChart account, a copy of this consent can be sent to you electronically.  All virtual visits are billed to your insurance company just like a traditional visit in the office.    As this is a virtual visit, video technology does not allow for your provider to perform a traditional examination.  This may limit your provider's ability to fully assess your condition.  If your provider identifies any concerns that need to be evaluated in person or the need to arrange testing (such as labs, EKG, etc.), we will make arrangements to do so.     Although advances in technology are sophisticated, we cannot ensure that it will always work on either your end or our end.  If the connection with a video visit is poor, the visit may have to be switched to a telephone visit.  With either a video or telephone visit, we are not always able to ensure that we have a secure connection.     I need to obtain your verbal consent now.   Are you willing to proceed with your visit today?    [name] has provided verbal consent on 12/07/2020 for a virtual visit (video or telephone).   Evelina Dun, FNP   Date: 12/07/2020 12:31 PM   Virtual Visit via Video Note   I, Evelina Dun, connected with  Michael Hodge  ([id number], 27-Apr-1950) on 12/07/20 at 12:15 PM EDT by a video-enabled telemedicine application and verified that I am speaking with the correct person using two identifiers.  Location: Patient: Virtual Visit Location Patient: Home Provider: Virtual Visit Location Provider: Home   I discussed the limitations of evaluation and management by telemedicine and  the availability of in person appointments. The patient expressed understanding and agreed to proceed.    History of Present Illness: Michael Hodge is a 70 y.o. who identifies as a male who was assigned male at birth, and is being seen today for cough, nasal congestion, sinus pressure that started 11 days ago. Had negative COVID test. He is flying soon and needs to feel better.   HPI: Sinusitis This is a new problem. The current episode started 1 to 4 weeks ago. The problem has been gradually worsening since onset. There has been no fever. His pain is at a severity of 4/10. The pain is moderate. Associated symptoms include congestion, coughing, headaches, a hoarse voice, sinus pressure, sneezing and a sore throat. Pertinent negatives include no chills, ear pain or swollen glands. Past treatments include oral decongestants. The treatment provided mild relief.   Problems:  Patient Active Problem List   Diagnosis Date Noted   Welcome to Medicare preventive visit 04/14/2015   Diabetes mellitus without complication ([place]) 92/42/6834   Sciatica 07/04/2013   Hypokalemia 07/04/2013   Type II or unspecified type diabetes mellitus without mention of complication, not stated as uncontrolled 10/11/2011   Health maintenance examination 09/17/2011   CAD (coronary artery disease) 09/08/2010   HTN (hypertension) 09/08/2010   Dyslipidemia 09/08/2010   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   ARTHRITIS 01/20/2008   ALLERGIC RHINITIS  10/30/2006    Allergies: No Known Allergies Medications:  Current Outpatient Medications:    amoxicillin-clavulanate (AUGMENTIN) 875-125 MG tablet, Take 1 tablet by mouth 2 (two) times daily., Disp: 14 tablet, Rfl: 0   aspirin 81 MG tablet, Take 81 mg by mouth daily. (Patient not taking: Reported on 08/19/2020), Disp: , Rfl:    atorvastatin (LIPITOR) 40 MG tablet, TAKE ONE TABLET BY MOUTH DAILY, Disp: 90 tablet, Rfl: 0   diclofenac (VOLTAREN) 75 MG EC tablet, TAKE ONE TABLET BY MOUTH  TWICE A DAY AS NEEDED, Disp: 60 tablet, Rfl: 4   glucose blood (FREESTYLE LITE) test strip, USE AS INSTRUCTED, Disp: 50 each, Rfl: 3   hydrochlorothiazide (HYDRODIURIL) 25 MG tablet, TAKE ONE TABLET BY MOUTH DAILY, Disp: 90 tablet, Rfl: 1   KLOR-CON M20 20 MEQ tablet, TAKE TWO TABLETS BY MOUTH TWICE A DAY, Disp: 360 tablet, Rfl: 0   metFORMIN (GLUCOPHAGE) 500 MG tablet, TAKE ONE TABLET BY MOUTH TWICE A DAY WITH SUPPER, Disp: 60 tablet, Rfl: 0  Observations/Objective: Patient is well-developed, well-nourished in no acute distress.  Resting comfortably  at home.  Head is normocephalic, atraumatic.  No labored breathing.  Speech is clear and coherent with logical content.  Patient is alert and oriented at baseline.  Nasal congestion   Assessment and Plan: 1. Acute sinusitis, recurrence not specified, unspecified location - amoxicillin-clavulanate (AUGMENTIN) 875-125 MG tablet; Take 1 tablet by mouth 2 (two) times daily.  Dispense: 14 tablet; Refill: 0 - Take meds as prescribed - Use a cool mist humidifier  -Use saline nose sprays frequently -Force fluids -For any cough or congestion  Use plain Mucinex- regular strength or max strength is fine -For fever or aces or pains- take tylenol or ibuprofen. -Throat lozenges if help - Follow up if symptoms worsen or do not improve   Follow Up Instructions: I discussed the assessment and treatment plan with the patient. The patient was provided an opportunity to ask questions and all were answered. The patient agreed with the plan and demonstrated an understanding of the instructions.  A copy of instructions were sent to the patient via MyChart unless otherwise noted below.     The patient was advised to call back or seek an in-person evaluation if the symptoms worsen or if the condition fails to improve as anticipated.  Time:  I spent 5 minutes with the patient via telehealth technology discussing the above problems/concerns.    Evelina Dun, FNP

## 2020-12-17 ENCOUNTER — Other Ambulatory Visit: Payer: Self-pay | Admitting: Family Medicine

## 2020-12-22 ENCOUNTER — Other Ambulatory Visit: Payer: Self-pay

## 2020-12-22 ENCOUNTER — Ambulatory Visit (INDEPENDENT_AMBULATORY_CARE_PROVIDER_SITE_OTHER): Payer: Medicare Other | Admitting: Family Medicine

## 2020-12-22 ENCOUNTER — Encounter: Payer: Self-pay | Admitting: Family Medicine

## 2020-12-22 VITALS — BP 112/64 | HR 59 | Temp 97.4°F | Resp 16 | Ht 65.5 in | Wt 195.4 lb

## 2020-12-22 DIAGNOSIS — E7800 Pure hypercholesterolemia, unspecified: Secondary | ICD-10-CM

## 2020-12-22 DIAGNOSIS — I10 Essential (primary) hypertension: Secondary | ICD-10-CM

## 2020-12-22 DIAGNOSIS — E119 Type 2 diabetes mellitus without complications: Secondary | ICD-10-CM

## 2020-12-22 LAB — BASIC METABOLIC PANEL
BUN: 30 mg/dL — ABNORMAL HIGH (ref 6–23)
CO2: 28 mEq/L (ref 19–32)
Calcium: 10.1 mg/dL (ref 8.4–10.5)
Chloride: 101 mEq/L (ref 96–112)
Creatinine, Ser: 1 mg/dL (ref 0.40–1.50)
GFR: 75.45 mL/min (ref >60.00)
Glucose, Bld: 118 mg/dL — ABNORMAL HIGH (ref 70–99)
Potassium: 4 mEq/L (ref 3.5–5.1)
Sodium: 137 mEq/L (ref 135–145)

## 2020-12-22 LAB — LIPID PANEL
Cholesterol: 103 mg/dL (ref 0–200)
HDL: 41.8 mg/dL (ref >39.00)
LDL Cholesterol: 25 mg/dL (ref 0–99)
NonHDL: 61.37
Total CHOL/HDL Ratio: 2
Triglycerides: 180 mg/dL — ABNORMAL HIGH (ref 0.0–149.0)
VLDL: 36 mg/dL (ref 0.0–40.0)

## 2020-12-22 LAB — HEMOGLOBIN A1C: Hgb A1c MFr Bld: 6.8 % — ABNORMAL HIGH (ref 4.6–6.5)

## 2020-12-22 MED ORDER — POTASSIUM CHLORIDE CRYS ER 20 MEQ PO TBCR: 40.0000 meq | EXTENDED_RELEASE_TABLET | Freq: Two times a day (BID) | ORAL | 3 refills | Status: DC

## 2020-12-22 MED ORDER — ATORVASTATIN CALCIUM 40 MG PO TABS: 40.0000 mg | ORAL_TABLET | Freq: Every day | ORAL | 3 refills | Status: DC

## 2020-12-22 MED ORDER — HYDROCHLOROTHIAZIDE 25 MG PO TABS: 25.0000 mg | ORAL_TABLET | Freq: Every day | ORAL | 3 refills | Status: DC

## 2020-12-22 MED ORDER — METFORMIN HCL ER 500 MG PO TB24: ORAL_TABLET | ORAL | 3 refills | Status: DC

## 2020-12-22 NOTE — Progress Notes (Signed)
OFFICE VISIT  12/22/2020  CC:  Chief Complaint  Patient presents with   Follow-up    RCI, pt is fasting   HPI:    Patient is a 70 y.o. male who presents for 6 mo f/u DM 2, HTN, HLD. A/P as of last visit: "1) DM: cont metformin 500mg  qd. Hba1c, lytes/cr, urine microalb/cr today. Feet exam today.   2) HTN: stable, cont hctz 25 qd. Lytes/cr today.   3) HLD: tolerating atorva 40mg  qd. FLP and hepatic panel today.   4) Preventative healthcare: Vaccines: ALL UTD. Labs: cmet, cbc, flp, a1c, psa Prostate ca screening: PSA today. Colon ca screening: next colonoscopy due 11/2020. Hep c screen neg 2018. Lung ca screening: does not qualify. AAA screening: does not qualify."  INTERIM HX: Michael Hodge is doing fine. Occasional home glucose check is within normal range fasting and 2-hour postprandial. Not checking home blood pressure much, did not recall any numbers. He feels like he is doing a good job with diet but is considering switching to intermittent fasting. 12/07/20 seen for VV and dx'd with sinusitis and rx'd augmentin.  Of note, he went to orthopedist after I saw him last and x-rays confirmed osteoarthritis and he got steroid injection in each knee and says it helped wonderfully.  His main issue with his knees is stiffness, not pain.  ROS as above, plus--> no fevers, no CP, no SOB, no wheezing, no cough, no dizziness, no HAs, no rashes, no melena/hematochezia.  No polyuria or polydipsia.  No myalgias.  No focal weakness, paresthesias, or tremors.  No acute vision or hearing abnormalities.  No dysuria or unusual/new urinary urgency or frequency.  No recent changes in lower legs. No n/v/d or abd pain.  No palpitations.     Past Medical History:  Diagnosis Date   Allergy    CAD (coronary artery disease)    CT demonstrated nonobstructive plaques (this test was prompted by borderline ETT.   Diabetes mellitus without complication ([place]) dx'd 07/4401   Fasting gluc 136 and HbA1c 6.7%  at diagnosis.    Diverticulosis    GERD (gastroesophageal reflux disease)    occ   Hepatic steatosis 07/2020   noted on CT abd/pelv done for GE illness   History of colitis 08/06/2020   presumed infectious. CT abd/pelv w/contrast at [facility] med c[facility]->long segment circumferential wall thickening of descending colon   Hx of adenomatous colonic polyps    Hyperlipidemia    Hypertension    w/hypertensive retinopathy   Internal hemorrhoids    Obesity, Class II, BMI 35-39.9    Osteoarthritis of both knees    mod to severe 2022, steroid injections by ortho    Past Surgical History:  Procedure Laterality Date   CARDIOVASCULAR STRESS TEST  02/2013   LexiScan/low-level exercise-Myoview (02/2013): No ischemia, EF 53%, occ symptomatic PVCs.   COLONOSCOPY     COLONOSCOPY W/ POLYPECTOMY  2005;08/21/10;11/29/15   adenomatous; also mild diverticulosis; recall 2017.  iFOB neg 09/2011.  11/2015: tubular adenoma x 1. Recall 5 yrs (11/2020)   HAMMER TOE SURGERY Right 05/24/2020   POLYPECTOMY     remvoal sebaceous cyst  15-20 years ago   TONSILLECTOMY      Outpatient Medications Prior to Visit  Medication Sig Dispense Refill   diclofenac (VOLTAREN) 75 MG EC tablet TAKE ONE TABLET BY MOUTH TWICE A DAY AS NEEDED 60 tablet 4   glucose blood (FREESTYLE LITE) test strip USE AS INSTRUCTED 50 each 3   amoxicillin-clavulanate (AUGMENTIN) 875-125 MG tablet  Take 1 tablet by mouth 2 (two) times daily. (Patient not taking: Reported on 12/22/2020) 14 tablet 0   aspirin 81 MG tablet Take 81 mg by mouth daily. (Patient not taking: Reported on 08/19/2020)     atorvastatin (LIPITOR) 40 MG tablet TAKE ONE TABLET BY MOUTH DAILY 90 tablet 0   hydrochlorothiazide (HYDRODIURIL) 25 MG tablet TAKE ONE TABLET BY MOUTH DAILY 90 tablet 1   KLOR-CON M20 20 MEQ tablet TAKE TWO TABLETS BY MOUTH TWICE A DAY 360 tablet 0   metFORMIN (GLUCOPHAGE) 500 MG tablet TAKE ONE TABLET BY MOUTH TWICE A DAY WITH SUPPER 60 tablet 0   No  facility-administered medications prior to visit.    No Known Allergies  ROS As per HPI  PE: Vitals with BMI 12/22/2020 08/19/2020 05/31/2020  Height 5' 5.5" 5' 5.5" 5' 5.5"  Weight 195 lbs 6 oz 198 lbs 3 oz 198 lbs 6 oz  BMI 32.01 31.49 70.2  Systolic 637 858 850  Diastolic 64 66 75  Pulse 59 71 59   Gen: Alert, well appearing.  Patient is oriented to person, place, time, and situation. AFFECT: pleasant, lucid thought and speech. CV: RRR, no m/r/g.   LUNGS: CTA bilat, nonlabored resps, good aeration in all lung fields. EXT: no clubbing or cyanosis.  no edema.    LABS:  Lab Results  Component Value Date   TSH 3.12 07/31/2017   Lab Results  Component Value Date   WBC 6.4 05/31/2020   HGB 15.1 05/31/2020   HCT 43.7 05/31/2020   MCV 89.7 05/31/2020   PLT 196.0 05/31/2020   Lab Results  Component Value Date   CREATININE 1.05 05/31/2020   BUN 24 (H) 05/31/2020   NA 138 05/31/2020   K 3.4 (L) 05/31/2020   CL 99 05/31/2020   CO2 28 05/31/2020   Lab Results  Component Value Date   ALT 23 05/31/2020   AST 22 05/31/2020   ALKPHOS 67 05/31/2020   BILITOT 1.3 (H) 05/31/2020   Lab Results  Component Value Date   CHOL 93 05/31/2020   Lab Results  Component Value Date   HDL 40.80 05/31/2020   Lab Results  Component Value Date   LDLCALC 32 05/31/2020   Lab Results  Component Value Date   TRIG 101.0 05/31/2020   Lab Results  Component Value Date   CHOLHDL 2 05/31/2020   Lab Results  Component Value Date   PSA 0.89 05/31/2020   PSA 1.36 10/14/2018   PSA 0.80 07/31/2017   Lab Results  Component Value Date   HGBA1C 7.0 (H) 05/31/2020    IMPRESSION AND PLAN:  #1: Diabetes, well controlled.  He is having some intermittent gastrointestinal upset and we decided to switch to extended release metformin at 1000 mg at supper. Hemoglobin A1c checked today.  2.  Hypertension, well controlled on HCTZ 25 a day.  He takes supplemental potassium for history of  hypokalemia on this medication.  Checking electrolytes and creatinine today.  #3 hyperlipidemia.  Taking atorvastatin 40 mg/day.  Last LDL was 32 6 months ago.  He is fasting today so we will check lipid panel and hepatic panel.  #4: Vaccines: ALL UTD. Prostate ca screening: PSA UTD, rpt 05/2021. Colon ca screening: next colonoscopy due--any time now (as of 11/2020)--he'll call them. Hep c screen neg 2018. Lung ca screening: does not qualify. AAA screening: does not qualify.  An After Visit Summary was printed and given to the patient.  FOLLOW UP: Return in  about 6 months (around 06/22/2021) for routine chronic illness f/u.  Signed:  Crissie Sickles, MD           12/22/2020

## 2021-01-03 DIAGNOSIS — Z23 Encounter for immunization: Secondary | ICD-10-CM | POA: Diagnosis not present

## 2021-01-04 DIAGNOSIS — M25561 Pain in right knee: Secondary | ICD-10-CM | POA: Diagnosis not present

## 2021-01-04 DIAGNOSIS — M25562 Pain in left knee: Secondary | ICD-10-CM | POA: Diagnosis not present

## 2021-01-17 DIAGNOSIS — H2513 Age-related nuclear cataract, bilateral: Secondary | ICD-10-CM | POA: Diagnosis not present

## 2021-01-17 DIAGNOSIS — H52223 Regular astigmatism, bilateral: Secondary | ICD-10-CM | POA: Diagnosis not present

## 2021-01-17 DIAGNOSIS — H5213 Myopia, bilateral: Secondary | ICD-10-CM | POA: Diagnosis not present

## 2021-01-17 DIAGNOSIS — H524 Presbyopia: Secondary | ICD-10-CM | POA: Diagnosis not present

## 2021-01-17 DIAGNOSIS — I10 Essential (primary) hypertension: Secondary | ICD-10-CM | POA: Diagnosis not present

## 2021-01-17 DIAGNOSIS — H35033 Hypertensive retinopathy, bilateral: Secondary | ICD-10-CM | POA: Diagnosis not present

## 2021-01-17 DIAGNOSIS — E119 Type 2 diabetes mellitus without complications: Secondary | ICD-10-CM | POA: Diagnosis not present

## 2021-01-22 ENCOUNTER — Encounter: Payer: Self-pay | Admitting: Family Medicine

## 2021-02-26 HISTORY — PX: COLONOSCOPY: SHX174

## 2021-03-06 ENCOUNTER — Ambulatory Visit (AMBULATORY_SURGERY_CENTER): Payer: Medicare Other | Admitting: *Deleted

## 2021-03-06 ENCOUNTER — Other Ambulatory Visit: Payer: Self-pay

## 2021-03-06 VITALS — Ht 66.0 in | Wt 193.0 lb

## 2021-03-06 DIAGNOSIS — Z86010 Personal history of colonic polyps: Secondary | ICD-10-CM

## 2021-03-06 MED ORDER — NA SULFATE-K SULFATE-MG SULF 17.5-3.13-1.6 GM/177ML PO SOLN: 1.0000 | ORAL | 0 refills | Status: DC

## 2021-03-06 NOTE — Progress Notes (Signed)
Patient's pre-visit was done today over the phone with the patient. Name,DOB and address verified. Patient denies any allergies to Eggs and Soy. Patient denies any problems with anesthesia/sedation. Patient is not taking any diet pills or blood thinners. No home Oxygen.  Prep instructions sent to pt's MyChart (if activated).Patient understands to call us back with any questions or concerns. Patient is aware of our care-partner policy and [id number] safety protocol.   EMMI education assigned to the patient for the procedure, sent to [facility].   The patient is COVID-19 vaccinated.

## 2021-03-08 ENCOUNTER — Other Ambulatory Visit: Payer: Self-pay | Admitting: Family Medicine

## 2021-03-13 ENCOUNTER — Encounter: Payer: Self-pay | Admitting: Internal Medicine

## 2021-03-13 ENCOUNTER — Ambulatory Visit (AMBULATORY_SURGERY_CENTER): Payer: Medicare PPO | Admitting: Internal Medicine

## 2021-03-13 ENCOUNTER — Other Ambulatory Visit: Payer: Self-pay

## 2021-03-13 VITALS — BP 145/76 | HR 53 | Temp 98.6°F | Resp 12 | Ht 65.5 in | Wt 193.0 lb

## 2021-03-13 DIAGNOSIS — D120 Benign neoplasm of cecum: Secondary | ICD-10-CM

## 2021-03-13 DIAGNOSIS — K635 Polyp of colon: Secondary | ICD-10-CM | POA: Diagnosis not present

## 2021-03-13 DIAGNOSIS — Z86010 Personal history of colonic polyps: Secondary | ICD-10-CM

## 2021-03-13 MED ORDER — SODIUM CHLORIDE 0.9 % IV SOLN: 500.0000 mL | Freq: Once | INTRAVENOUS | Status: DC

## 2021-03-13 NOTE — Op Note (Signed)
[place] Patient Name: Michael Hodge Procedure Date: 03/13/2021 7:33 AM MRN: [id number] Endoscopist: Docia Chuck. Henrene Pastor , MD Age: 71 Referring MD:  Date of Birth: 1950/08/12 Gender: Male Account #: [id number] Procedure:                Colonoscopy with cold snare polypectomy x 1 Indications:              High risk colon cancer surveillance: Personal                            history of non-advanced adenoma, High risk colon                            cancer surveillance: Personal history of sessile                            serrated colon polyp (less than 10 mm in size) with                            no dysplasia. Previous examinations 2005, 2012, 2017 Medicines:                Monitored Anesthesia Care Procedure:                Pre-Anesthesia Assessment:                           - Prior to the procedure, a History and Physical                            was performed, and patient medications and                            allergies were reviewed. The patient's tolerance of                            previous anesthesia was also reviewed. The risks                            and benefits of the procedure and the sedation                            options and risks were discussed with the patient.                            All questions were answered, and informed consent                            was obtained. Prior Anticoagulants: The patient has                            taken no previous anticoagulant or antiplatelet                            agents. ASA Grade Assessment: II - A patient with  mild systemic disease. After reviewing the risks                            and benefits, the patient was deemed in                            satisfactory condition to undergo the procedure.                           After obtaining informed consent, the colonoscope                            was passed under direct vision. Throughout the                             procedure, the patient's blood pressure, pulse, and                            oxygen saturations were monitored continuously. The                            CF [id number] #[id number] was introduced through the anus                            and advanced to the the cecum, identified by                            appendiceal orifice and ileocecal valve. The                            ileocecal valve, appendiceal orifice, and rectum                            were photographed. The quality of the bowel                            preparation was excellent. The colonoscopy was                            performed without difficulty. The patient tolerated                            the procedure well. The bowel preparation used was                            SUPREP via split dose instruction. Scope In: 8:15:32 AM Scope Out: 8:30:13 AM Scope Withdrawal Time: 0 hours 10 minutes 32 seconds  Total Procedure Duration: 0 hours 14 minutes 41 seconds  Findings:                 A 1 mm polyp was found in the cecum. The polyp was                            removed with a cold snare.  Resection and retrieval                            were complete.                           Many diverticula were found in the entire colon.                            Small internal hemorrhoids.                           The exam was otherwise without abnormality on                            direct and retroflexion views. Complications:            No immediate complications. Estimated blood loss:                            None. Estimated Blood Loss:     Estimated blood loss: none. Impression:               - One 1 mm polyp in the cecum, removed with a cold                            snare. Resected and retrieved.                           - Diverticulosis in the entire examined colon.                           - The examination was otherwise normal on direct                            and retroflexion  views. Recommendation:           - Repeat colonoscopy in 5 years for surveillance.                           - Patient has a contact number available for                            emergencies. The signs and symptoms of potential                            delayed complications were discussed with the                            patient. Return to normal activities tomorrow.                            Written discharge instructions were provided to the                            patient.                           -  Resume previous diet.                           - Continue present medications.                           - Await pathology results. Docia Chuck. Henrene Pastor, MD 03/13/2021 8:41:03 AM This report has been signed electronically.

## 2021-03-13 NOTE — Progress Notes (Signed)
Called to room to assist during endoscopic procedure.  Patient ID and intended procedure confirmed with present staff. Received instructions for my participation in the procedure from the performing physician.  

## 2021-03-13 NOTE — Patient Instructions (Signed)
Please read handouts provided. Continue present medications. Await pathology results.   YOU HAD AN ENDOSCOPIC PROCEDURE TODAY AT THE [facility]:   Refer to the procedure report that was given to you for any specific questions about what was found during the examination.  If the procedure report does not answer your questions, please call your gastroenterologist to clarify.  If you requested that your care partner not be given the details of your procedure findings, then the procedure report has been included in a sealed envelope for you to review at your convenience later.  YOU SHOULD EXPECT: Some feelings of bloating in the abdomen. Passage of more gas than usual.  Walking can help get rid of the air that was put into your GI tract during the procedure and reduce the bloating. If you had a lower endoscopy (such as a colonoscopy or flexible sigmoidoscopy) you may notice spotting of blood in your stool or on the toilet paper. If you underwent a bowel prep for your procedure, you may not have a normal bowel movement for a few days.  Please Note:  You might notice some irritation and congestion in your nose or some drainage.  This is from the oxygen used during your procedure.  There is no need for concern and it should clear up in a day or so.  SYMPTOMS TO REPORT IMMEDIATELY:  Following lower endoscopy (colonoscopy or flexible sigmoidoscopy):  Excessive amounts of blood in the stool  Significant tenderness or worsening of abdominal pains  Swelling of the abdomen that is new, acute  Fever of 100F or higher   For urgent or emergent issues, a gastroenterologist can be reached at any hour by calling [phone]. Do not use MyChart messaging for urgent concerns.    DIET:  We do recommend a small meal at first, but then you may proceed to your regular diet.  Drink plenty of fluids but you should avoid alcoholic beverages for 24 hours.  ACTIVITY:  You should plan to take it easy  for the rest of today and you should NOT DRIVE or use heavy machinery until tomorrow (because of the sedation medicines used during the test).    FOLLOW UP: Our staff will call the number listed on your records 48-72 hours following your procedure to check on you and address any questions or concerns that you may have regarding the information given to you following your procedure. If we do not reach you, we will leave a message.  We will attempt to reach you two times.  During this call, we will ask if you have developed any symptoms of COVID 19. If you develop any symptoms (ie: fever, flu-like symptoms, shortness of breath, cough etc.) before then, please call [phone].  If you test positive for Covid 19 in the 2 weeks post procedure, please call and report this information to us.    If any biopsies were taken you will be contacted by phone or by letter within the next 1-3 weeks.  Please call us at [phone] if you have not heard about the biopsies in 3 weeks.    SIGNATURES/CONFIDENTIALITY: You and/or your care partner have signed paperwork which will be entered into your electronic medical record.  These signatures attest to the fact that that the information above on your After Visit Summary has been reviewed and is understood.  Full responsibility of the confidentiality of this discharge information lies with you and/or your care-partner.  

## 2021-03-13 NOTE — Progress Notes (Signed)
Sedate, gd SR, tolerated procedure well, VSS, report to RN 

## 2021-03-13 NOTE — Progress Notes (Signed)
HISTORY OF PRESENT ILLNESS:  Michael Hodge is a 71 y.o. male with a history of sessile serrated polyps and adenomatous polyps.  Presents today for follow-up.  No active complaints  REVIEW OF SYSTEMS:  All non-GI ROS negative. Past Medical History:  Diagnosis Date   Allergy    CAD (coronary artery disease)    CT demonstrated nonobstructive plaques (this test was prompted by borderline ETT.   Cancer [facility])    basal   Colitis 07/2020   Went to ER in [place], [place], resolved with Augmentin   Diabetes mellitus without complication ([place]) dx'd 02/7913   Fasting gluc 136 and HbA1c 6.7% at diagnosis.    Diverticulosis    GERD (gastroesophageal reflux disease)    occ   Hepatic steatosis 07/2020   noted on CT abd/pelv done for GE illness   History of colitis 08/06/2020   presumed infectious. CT abd/pelv w/contrast at [facility] med c[facility]->long segment circumferential wall thickening of descending colon   Hx of adenomatous colonic polyps    Hyperlipidemia    Hypertension    w/hypertensive retinopathy   Internal hemorrhoids    Obesity, Class II, BMI 35-39.9    Osteoarthritis of both knees    mod to severe 2022, steroid injections by ortho HELPED VERY WELL---pt interested in periodic steroid injections in future if worsening    Past Surgical History:  Procedure Laterality Date   CARDIOVASCULAR STRESS TEST  02/2013   LexiScan/low-level exercise-Myoview (02/2013): No ischemia, EF 53%, occ symptomatic PVCs.   COLONOSCOPY     COLONOSCOPY W/ POLYPECTOMY  2005;08/21/10;11/29/15   adenomatous; also mild diverticulosis; recall 2017.  iFOB neg 09/2011.  11/2015: tubular adenoma x 1. Recall 5 yrs (11/2020)   HAMMER TOE SURGERY Right 05/24/2020   POLYPECTOMY     remvoal sebaceous cyst  15-20 years ago   [place]  reports that he has never smoked. He has never used smokeless tobacco. He reports current alcohol use of about 3.0 standard drinks per week. He reports that he  does not use drugs.  family history includes Diabetes in his father; Heart attack in his mother; Stroke in his father and another family member.  No Known Allergies     PHYSICAL EXAMINATION:  Vital signs: BP (!) 157/57    Pulse 60    Temp 98.6 F (37 C)    Ht 5' 5.5" (1.664 m)    Wt 193 lb (87.5 kg)    SpO2 98%    BMI 31.63 kg/m  General: Well-developed, well-nourished, no acute distress HEENT: Sclerae are anicteric, conjunctiva pink. Oral mucosa intact Lungs: Clear Heart: Regular Abdomen: soft, nontender, nondistended, no obvious ascites, no peritoneal signs, normal bowel sounds. No organomegaly. Extremities: No edema Psychiatric: alert and oriented x3. Cooperative     ASSESSMENT:  1.  History of sessile serrated polyps and tubular adenoma.  Due for follow-up colonoscopy   PLAN:   1.  Surveillance colonoscopy

## 2021-03-15 ENCOUNTER — Telehealth: Payer: Self-pay | Admitting: *Deleted

## 2021-03-15 ENCOUNTER — Telehealth: Payer: Self-pay

## 2021-03-15 NOTE — Telephone Encounter (Signed)
°  Follow up Call-  Call back number 03/13/2021  Post procedure Call Back phone  # [phone]  Permission to leave phone message Yes  Some recent data might be hidden     Patient questions:  Do you have a fever, pain , or abdominal swelling? No. Pain Score  0 *  Have you tolerated food without any problems? Yes.    Have you been able to return to your normal activities? Yes.    Do you have any questions about your discharge instructions: Diet   No. Medications  No. Follow up visit  No.  Do you have questions or concerns about your Care? No.  Actions: * If pain score is 4 or above: No action needed, pain <4.

## 2021-03-15 NOTE — Telephone Encounter (Signed)
Left message on f/u call 

## 2021-03-16 ENCOUNTER — Encounter: Payer: Self-pay | Admitting: Internal Medicine

## 2021-04-19 ENCOUNTER — Ambulatory Visit: Payer: Medicare Other

## 2021-04-19 ENCOUNTER — Ambulatory Visit: Payer: BLUE CROSS/BLUE SHIELD

## 2021-04-20 ENCOUNTER — Encounter: Payer: Self-pay | Admitting: Family Medicine

## 2021-04-20 ENCOUNTER — Telehealth (INDEPENDENT_AMBULATORY_CARE_PROVIDER_SITE_OTHER): Payer: Medicare PPO | Admitting: Family Medicine

## 2021-04-20 ENCOUNTER — Other Ambulatory Visit: Payer: Self-pay

## 2021-04-20 VITALS — BP 119/70 | Temp 99.1°F | Ht 66.0 in | Wt 193.0 lb

## 2021-04-20 DIAGNOSIS — J988 Other specified respiratory disorders: Secondary | ICD-10-CM | POA: Diagnosis not present

## 2021-04-20 DIAGNOSIS — U071 COVID-19: Secondary | ICD-10-CM | POA: Diagnosis not present

## 2021-04-20 MED ORDER — HYDROCODONE BIT-HOMATROP MBR 5-1.5 MG/5ML PO SOLN: ORAL | 0 refills | Status: DC

## 2021-04-20 MED ORDER — NIRMATRELVIR/RITONAVIR (PAXLOVID)TABLET: 3.0000 | ORAL_TABLET | Freq: Two times a day (BID) | ORAL | 0 refills | Status: AC

## 2021-04-20 NOTE — Progress Notes (Signed)
Virtual Visit via Video Note  I connected with Michael Hodge on 04/20/21 at 11:15 AM EST by a video enabled telemedicine application and verified that I am speaking with the correct person using two identifiers.  Location patient: [name] Location provider:work or home office Persons participating in the virtual visit: patient, provider  I discussed the limitations and requested verbal permission for telemedicine visit. The patient expressed understanding and agreed to proceed.   HPI: 71 y/o male being seen today for positive home covid test. Patient describes onset of nasal congestion, sneezing, runny nose, and slight cough 2 days ago. The cough has increased in severity.  He tested himself for COVID at home yesterday and was positive. Tmax 99.  He has some mild fatigue and body aches. No shortness of breath, wheezing, or chest pain.  No nausea vomiting or diarrhea.  Drinking fluids well.  -Pertinent medication allergies:No Known Allergies  Immunization History  Administered Date(s) Administered   DTaP 02/27/2007   Fluad Quad(high Dose 65+) 11/26/2018, 01/03/2021   Hepatitis B 02/27/2007   IPV 02/27/2007   Influenza Split 01/23/2011, 11/14/2012   Influenza, High Dose Seasonal PF 11/09/2015, 11/30/2016, 01/15/2018   Influenza,inj,Quad PF,6+ Mos 12/25/2013   Influenza-Unspecified 11/19/2014, 01/20/2020   PFIZER(Purple Top)SARS-COV-2 Vaccination 03/18/2019, 04/09/2019, 12/28/2019, 07/11/2020   Pfizer Covid-19 Vaccine Bivalent Booster 47yrs & up 01/03/2021   Pneumococcal Conjugate-13 11/14/2012   Pneumococcal Polysaccharide-23 04/14/2015   Td 01/20/2008, 02/05/2018   Tdap 08/19/2020   Zoster Recombinat (Shingrix) 12/26/2016, 04/18/2017   Zoster, Live 04/12/2009   ROS: See pertinent positives and negatives per HPI.  Past Medical History:  Diagnosis Date   Allergy    CAD (coronary artery disease)    CT demonstrated nonobstructive plaques (this test was prompted by borderline ETT.   Cancer  [facility])    basal   Colitis 07/2020   Went to ER in [place], [place], resolved with Augmentin   Diabetes mellitus without complication ([name]) dx'd 02/4780   Fasting gluc 136 and HbA1c 6.7% at diagnosis.    Diverticulosis    GERD (gastroesophageal reflux disease)    occ   Hepatic steatosis 07/2020   noted on CT abd/pelv done for GE illness   History of colitis 08/06/2020   presumed infectious. CT abd/pelv w/contrast at [facility]->long segment circumferential wall thickening of descending colon   Hx of adenomatous colonic polyps    Hyperlipidemia    Hypertension    w/hypertensive retinopathy   Internal hemorrhoids    Obesity, Class II, BMI 35-39.9    Osteoarthritis of both knees    mod to severe 2022, steroid injections by ortho HELPED VERY WELL---pt interested in periodic steroid injections in future if worsening    Past Surgical History:  Procedure Laterality Date   CARDIOVASCULAR STRESS TEST  02/2013   LexiScan/low-level exercise-Myoview (02/2013): No ischemia, EF 53%, occ symptomatic PVCs.   COLONOSCOPY     COLONOSCOPY W/ POLYPECTOMY  2005;08/21/10;11/29/15   adenomatous; also mild diverticulosis; recall 2017.  iFOB neg 09/2011.  11/2015: tubular adenoma x 1. Recall 5 yrs (11/2020)   HAMMER TOE SURGERY Right 05/24/2020   POLYPECTOMY     remvoal sebaceous cyst  15-20 years ago   TONSILLECTOMY       Current Outpatient Medications:    atorvastatin (LIPITOR) 40 MG tablet, Take 1 tablet (40 mg total) by mouth daily., Disp: 90 tablet, Rfl: 3   diclofenac (VOLTAREN) 75 MG EC tablet, TAKE ONE TABLET BY MOUTH TWICE A DAY AS NEEDED, Disp: 60 tablet, Rfl:  2   glucose blood (FREESTYLE LITE) test strip, USE AS INSTRUCTED, Disp: 50 each, Rfl: 3   hydrochlorothiazide (HYDRODIURIL) 25 MG tablet, Take 1 tablet (25 mg total) by mouth daily., Disp: 90 tablet, Rfl: 3   metFORMIN (GLUCOPHAGE XR) 500 MG 24 hr tablet, 2 tabs po daily with supper, Disp: 180 tablet, Rfl: 3   potassium chloride SA (KLOR-CON  M20) 20 MEQ tablet, Take 2 tablets (40 mEq total) by mouth 2 (two) times daily., Disp: 360 tablet, Rfl: 3  EXAM:  VITALS per patient if applicable:  Vitals with BMI 04/20/2021 03/13/2021 03/13/2021  Height 5\' 6"  - -  Weight 193 lbs - -  BMI 44.81 - -  Systolic 856 314 970  Diastolic 70 76 68  Pulse - 53 55  Temp 99.1 O2 sat 96%   GENERAL: alert, oriented, appears well and in no acute distress  HEENT: atraumatic, conjunttiva clear, no obvious abnormalities on inspection of external nose and ears  NECK: normal movements of the head and neck  LUNGS: on inspection no signs of respiratory distress, breathing rate appears normal, no obvious gross SOB, gasping or wheezing  CV: no obvious cyanosis  MS: moves all visible extremities without noticeable abnormality  PSYCH/NEURO: pleasant and cooperative, no obvious depression or anxiety, speech and thought processing grossly intact  LaBS:    Chemistry      Component Value Date/Time   NA 137 12/22/2020 1048   NA 141 09/09/2017 0000   K 4.0 12/22/2020 1048   CL 101 12/22/2020 1048   CO2 28 12/22/2020 1048   BUN 30 (H) 12/22/2020 1048   BUN 29 (A) 09/09/2017 0000   CREATININE 1.00 12/22/2020 1048   GLU 117 09/09/2017 0000      Component Value Date/Time   CALCIUM 10.1 12/22/2020 1048   ALKPHOS 67 05/31/2020 1006   AST 22 05/31/2020 1006   ALT 23 05/31/2020 1006   BILITOT 1.3 (H) 05/31/2020 1006     ASSESSMENT AND PLAN:  Discussed the following assessment and plan:  COVID-19 respiratory illness. Patient's risk factors for complications from COVID are: Age greater than 25, diabetes. GFR 75 ml/min Oct 2022. Prescribed Paxlovid today.  Stop atorvastatin while taking Paxlovid. Hycodan, 1 to 2 teaspoons twice daily as needed, #120 mils. Take over-the-counter Mucinex DM every morning and use saline nasal spray. Quarantine precautions discussed.  I discussed the assessment and treatment plan with the patient. The patient was  provided an opportunity to ask questions and all were answered. The patient agreed with the plan and demonstrated an understanding of the instructions.   F/u: if not signif improved in 4-5d  Signed:  Crissie Sickles, MD           04/20/2021

## 2021-04-26 ENCOUNTER — Ambulatory Visit (INDEPENDENT_AMBULATORY_CARE_PROVIDER_SITE_OTHER): Payer: Medicare PPO

## 2021-04-26 ENCOUNTER — Other Ambulatory Visit: Payer: Self-pay

## 2021-04-26 DIAGNOSIS — Z0000 Encounter for general adult medical examination without abnormal findings: Secondary | ICD-10-CM | POA: Diagnosis not present

## 2021-04-26 NOTE — Patient Instructions (Signed)
Mr. Michael Hodge , Thank you for taking time to come for your Medicare Wellness Visit. I appreciate your ongoing commitment to your health goals. Please review the following plan we discussed and let me know if I can assist you in the future.   Screening recommendations/referrals: Colonoscopy: Done 03/13/21 repeat every 5 years  Recommended yearly ophthalmology/optometry visit for glaucoma screening and checkup Recommended yearly dental visit for hygiene and checkup  Vaccinations: Influenza vaccine: Done 01/03/21 repeat every year Pneumococcal vaccine: Up to date Tdap vaccine: Done 08/19/20 repeat every 10 years  Shingles vaccine: Completed 12/26/16 & 04/18/17   Covid-19: Completed 1/20, 2/11, 12/28/19 & 5/16, 01/03/21  Advanced directives: Please bring a copy of your health care power of attorney and living will to the office at your convenience.   Conditions/risks identified: Drop a few lbs  Next appointment: Follow up in one year for your annual wellness visit.   Preventive Care 36 Years and Older, Male Preventive care refers to lifestyle choices and visits with your health care provider that can promote health and wellness. What does preventive care include? A yearly physical exam. This is also called an annual well check. Dental exams once or twice a year. Routine eye exams. Ask your health care provider how often you should have your eyes checked. Personal lifestyle choices, including: Daily care of your teeth and gums. Regular physical activity. Eating a healthy diet. Avoiding tobacco and drug use. Limiting alcohol use. Practicing safe sex. Taking low doses of aspirin every day. Taking vitamin and mineral supplements as recommended by your health care provider. What happens during an annual well check? The services and screenings done by your health care provider during your annual well check will depend on your age, overall health, lifestyle risk factors, and family history of  disease. Counseling  Your health care provider may ask you questions about your: Alcohol use. Tobacco use. Drug use. Emotional well-being. Home and relationship well-being. Sexual activity. Eating habits. History of falls. Memory and ability to understand (cognition). Work and work Statistician. Screening  You may have the following tests or measurements: Height, weight, and BMI. Blood pressure. Lipid and cholesterol levels. These may be checked every 5 years, or more frequently if you are over 60 years old. Skin check. Lung cancer screening. You may have this screening every year starting at age 71 if you have a 30-pack-year history of smoking and currently smoke or have quit within the past 15 years. Fecal occult blood test (FOBT) of the stool. You may have this test every year starting at age 54. Flexible sigmoidoscopy or colonoscopy. You may have a sigmoidoscopy every 5 years or a colonoscopy every 10 years starting at age 20. Prostate cancer screening. Recommendations will vary depending on your family history and other risks. Hepatitis C blood test. Hepatitis B blood test. Sexually transmitted disease (STD) testing. Diabetes screening. This is done by checking your blood sugar (glucose) after you have not eaten for a while (fasting). You may have this done every 1-3 years. Abdominal aortic aneurysm (AAA) screening. You may need this if you are a current or former smoker. Osteoporosis. You may be screened starting at age 59 if you are at high risk. Talk with your health care provider about your test results, treatment options, and if necessary, the need for more tests. Vaccines  Your health care provider may recommend certain vaccines, such as: Influenza vaccine. This is recommended every year. Tetanus, diphtheria, and acellular pertussis (Tdap, Td) vaccine. You may need  a Td booster every 10 years. Zoster vaccine. You may need this after age 69. Pneumococcal 13-valent  conjugate (PCV13) vaccine. One dose is recommended after age 13. Pneumococcal polysaccharide (PPSV23) vaccine. One dose is recommended after age 58. Talk to your health care provider about which screenings and vaccines you need and how often you need them. This information is not intended to replace advice given to you by your health care provider. Make sure you discuss any questions you have with your health care provider. Document Released: 03/11/2015 Document Revised: 11/02/2015 Document Reviewed: 12/14/2014 Elsevier Interactive Patient Education  2017 [place] Prevention in the Home Falls can cause injuries. They can happen to people of all ages. There are many things you can do to make your home safe and to help prevent falls. What can I do on the outside of my home? Regularly fix the edges of walkways and driveways and fix any cracks. Remove anything that might make you trip as you walk through a door, such as a raised step or threshold. Trim any bushes or trees on the path to your home. Use bright outdoor lighting. Clear any walking paths of anything that might make someone trip, such as rocks or tools. Regularly check to see if handrails are loose or broken. Make sure that both sides of any steps have handrails. Any raised decks and porches should have guardrails on the edges. Have any leaves, snow, or ice cleared regularly. Use sand or salt on walking paths during winter. Clean up any spills in your garage right away. This includes oil or grease spills. What can I do in the bathroom? Use night lights. Install grab bars by the toilet and in the tub and shower. Do not use towel bars as grab bars. Use non-skid mats or decals in the tub or shower. If you need to sit down in the shower, use a plastic, non-slip stool. Keep the floor dry. Clean up any water that spills on the floor as soon as it happens. Remove soap buildup in the tub or shower regularly. Attach bath mats  securely with double-sided non-slip rug tape. Do not have throw rugs and other things on the floor that can make you trip. What can I do in the bedroom? Use night lights. Make sure that you have a light by your bed that is easy to reach. Do not use any sheets or blankets that are too big for your bed. They should not hang down onto the floor. Have a firm chair that has side arms. You can use this for support while you get dressed. Do not have throw rugs and other things on the floor that can make you trip. What can I do in the kitchen? Clean up any spills right away. Avoid walking on wet floors. Keep items that you use a lot in easy-to-reach places. If you need to reach something above you, use a strong step stool that has a grab bar. Keep electrical cords out of the way. Do not use floor polish or wax that makes floors slippery. If you must use wax, use non-skid floor wax. Do not have throw rugs and other things on the floor that can make you trip. What can I do with my stairs? Do not leave any items on the stairs. Make sure that there are handrails on both sides of the stairs and use them. Fix handrails that are broken or loose. Make sure that handrails are as long as the stairways. Check  any carpeting to make sure that it is firmly attached to the stairs. Fix any carpet that is loose or worn. Avoid having throw rugs at the top or bottom of the stairs. If you do have throw rugs, attach them to the floor with carpet tape. Make sure that you have a light switch at the top of the stairs and the bottom of the stairs. If you do not have them, ask someone to add them for you. What else can I do to help prevent falls? Wear shoes that: Do not have high heels. Have rubber bottoms. Are comfortable and fit you well. Are closed at the toe. Do not wear sandals. If you use a stepladder: Make sure that it is fully opened. Do not climb a closed stepladder. Make sure that both sides of the stepladder  are locked into place. Ask someone to hold it for you, if possible. Clearly mark and make sure that you can see: Any grab bars or handrails. First and last steps. Where the edge of each step is. Use tools that help you move around (mobility aids) if they are needed. These include: Canes. Walkers. Scooters. Crutches. Turn on the lights when you go into a dark area. Replace any light bulbs as soon as they burn out. Set up your furniture so you have a clear path. Avoid moving your furniture around. If any of your floors are uneven, fix them. If there are any pets around you, be aware of where they are. Review your medicines with your doctor. Some medicines can make you feel dizzy. This can increase your chance of falling. Ask your doctor what other things that you can do to help prevent falls. This information is not intended to replace advice given to you by your health care provider. Make sure you discuss any questions you have with your health care provider. Document Released: 12/09/2008 Document Revised: 07/21/2015 Document Reviewed: 03/19/2014 Elsevier Interactive Patient Education  2017 [employer].

## 2021-04-26 NOTE — Progress Notes (Signed)
Virtual Visit via Telephone Note  I connected with  Michael Hodge on 04/26/21 at  8:45 AM EST by telephone and verified that I am speaking with the correct person using two identifiers.  Medicare Annual Wellness visit completed telephonically due to Covid-19 pandemic.   Persons participating in this call: This Health Coach and this patient.   Location: Patient: Home Provider: Office   I discussed the limitations, risks, security and privacy concerns of performing an evaluation and management service by telephone and the availability of in person appointments. The patient expressed understanding and agreed to proceed.  Unable to perform video visit due to video visit attempted and failed and/or patient does not have video capability.   Some vital signs may be absent or patient reported.   Willette Brace, LPN   Subjective:   Michael Hodge is a 71 y.o. male who presents for Medicare Annual/Subsequent preventive examination.  Review of Systems     Cardiac Risk Factors include: advanced age (>45men, >26 women);diabetes mellitus;dyslipidemia;male gender;hypertension;obesity (BMI >30kg/m2)     Objective:    There were no vitals filed for this visit. There is no height or weight on file to calculate BMI.  Advanced Directives 04/26/2021 04/13/2020 11/30/2016 11/07/2015  Does Patient Have a Medical Advance Directive? Yes Yes Yes No  Type of Paramedic of [place];Living will [place];Living will -  Copy of [place] in Chart? No - copy requested No - copy requested No - copy requested -    Current Medications (verified) Outpatient Encounter Medications as of 04/26/2021  Medication Sig   atorvastatin (LIPITOR) 40 MG tablet Take 1 tablet (40 mg total) by mouth daily.   diclofenac (VOLTAREN) 75 MG EC tablet TAKE ONE TABLET BY MOUTH TWICE A DAY AS NEEDED   glucose blood (FREESTYLE LITE) test strip  USE AS INSTRUCTED   hydrochlorothiazide (HYDRODIURIL) 25 MG tablet Take 1 tablet (25 mg total) by mouth daily.   HYDROcodone bit-homatropine (HYCODAN) 5-1.5 MG/5ML syrup 1-2 tsp po bid prn cough   metFORMIN (GLUCOPHAGE XR) 500 MG 24 hr tablet 2 tabs po daily with supper   potassium chloride SA (KLOR-CON M20) 20 MEQ tablet Take 2 tablets (40 mEq total) by mouth 2 (two) times daily.   No facility-administered encounter medications on file as of 04/26/2021.    Allergies (verified) Patient has no known allergies.   History: Past Medical History:  Diagnosis Date   Allergy    CAD (coronary artery disease)    CT demonstrated nonobstructive plaques (this test was prompted by borderline ETT.   Cancer [facility])    basal   Colitis 07/2020   Went to ER in [place], [place], resolved with Augmentin   Diabetes mellitus without complication ([place]) dx'd 03/2023   Fasting gluc 136 and HbA1c 6.7% at diagnosis.    Diverticulosis    GERD (gastroesophageal reflux disease)    occ   Hepatic steatosis 07/2020   noted on CT abd/pelv done for GE illness   History of colitis 08/06/2020   presumed infectious. CT abd/pelv w/contrast at [facility]->long segment circumferential wall thickening of descending colon   Hx of adenomatous colonic polyps    Hyperlipidemia    Hypertension    w/hypertensive retinopathy   Internal hemorrhoids    Obesity, Class II, BMI 35-39.9    Osteoarthritis of both knees    mod to severe 2022, steroid injections by ortho HELPED VERY WELL---pt interested  in periodic steroid injections in future if worsening   Past Surgical History:  Procedure Laterality Date   CARDIOVASCULAR STRESS TEST  02/2013   LexiScan/low-level exercise-Myoview (02/2013): No ischemia, EF 53%, occ symptomatic PVCs.   COLONOSCOPY     COLONOSCOPY W/ POLYPECTOMY  2005;08/21/10;11/29/15   adenomatous; also mild diverticulosis; recall 2017.  iFOB neg 09/2011.  11/2015: tubular adenoma x 1. Recall 5 yrs (11/2020)   HAMMER TOE  SURGERY Right 05/24/2020   POLYPECTOMY     remvoal sebaceous cyst  15-20 years ago   TONSILLECTOMY     Family History  Problem Relation Age of Onset   Heart attack Mother    Diabetes Father    Stroke Father    Stroke Other    Colon cancer Neg Hx    Colon polyps Neg Hx    Rectal cancer Neg Hx    Stomach cancer Neg Hx    Esophageal cancer Neg Hx    Social History   Socioeconomic History   Marital status: Married    Spouse name: Not on file   Number of children: Not on file   Years of education: Not on file   Highest education level: Not on file  Occupational History   Not on file  Tobacco Use   Smoking status: Never   Smokeless tobacco: Never  Vaping Use   Vaping Use: Never used  Substance and Sexual Activity   Alcohol use: Yes    Alcohol/week: 3.0 standard drinks    Types: 3 Shots of liquor per week    Comment: occ   Drug use: No   Sexual activity: Yes  Other Topics Concern   Not on file  Social History Narrative   Married x 40 yrs.   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   No T/A/Ds.   Social Determinants of Health   Financial Resource Strain: Low Risk    Difficulty of Paying Living Expenses: Not hard at all  Food Insecurity: No Food Insecurity   Worried About Charity fundraiser in the Last Year: Never true   [place] in the Last Year: Never true  Transportation Needs: No Transportation Needs   Lack of Transportation (Medical): No   Lack of Transportation (Non-Medical): No  Physical Activity: Insufficiently Active   Days of Exercise per Week: 2 days   Minutes of Exercise per Session: 60 min  Stress: No Stress Concern Present   Feeling of Stress : Not at all  Social Connections: Socially Integrated   Frequency of Communication with Friends and Family: More than three times a week   Frequency of Social Gatherings with Friends and Family: More than three times a week   Attends Religious Services: More than 4 times per  year   Active Member of [employer] or Organizations: Yes   Attends Archivist Meetings: 1 to 4 times per year   Marital Status: Married    Tobacco Counseling Counseling given: Not Answered   Clinical Intake:  Pre-visit preparation completed: Yes  Pain : No/denies pain     BMI - recorded: 31.17 Nutritional Status: BMI > 30  Obese Nutritional Risks: None Diabetes: Yes CBG done?: No Did pt. bring in CBG monitor from home?: No  How often do you need to have someone help you when you read instructions, pamphlets, or other written materials from your doctor or pharmacy?: 1 - Never  Diabetic?Nutrition Risk Assessment:  Has the patient had any N/V/D  within the last 2 months?  No  Does the patient have any non-healing wounds?  No  Has the patient had any unintentional weight loss or weight gain?  No   Diabetes:  Is the patient diabetic?  Yes  If diabetic, was a CBG obtained today?  No  Did the patient bring in their glucometer from home?  No  How often do you monitor your CBG's? As needed .   Financial Strains and Diabetes Management:  Are you having any financial strains with the device, your supplies or your medication? No .  Does the patient want to be seen by Chronic Care Management for management of their diabetes?  No  Would the patient like to be referred to a Nutritionist or for Diabetic Management?  No   Diabetic Exams:  Diabetic eye exam 01/17/21 Diabetic Foot Exam: Completed 05/31/20   Interpreter Needed?: No  Information entered by :: Charlott Rakes, LPN   Activities of Daily Living In your present state of health, do you have any difficulty performing the following activities: 04/26/2021  Hearing? N  Vision? N  Difficulty concentrating or making decisions? N  Walking or climbing stairs? N  Dressing or bathing? N  Doing errands, shopping? N  Preparing Food and eating ? N  Using the Toilet? N  In the past six months, have you accidently leaked  urine? N  Do you have problems with loss of bowel control? N  Managing your Medications? N  Managing your Finances? N  Housekeeping or managing your Housekeeping? N  Some recent data might be hidden    Patient Care Team: Tammi Sou, MD as PCP - General (Family Medicine) Marica Otter, [place] as Consulting Physician (Optometry) Irene Shipper, MD as Consulting Physician (Gastroenterology) Minus Breeding, MD as Consulting Physician (Cardiology) Regal, Tamala Fothergill, DPM as Consulting Physician (Podiatry) Renette Butters, MD as Consulting Physician (Orthopedic Surgery)  Indicate any recent Medical Services you may have received from other than [facility] providers in the past year (date may be approximate).     Assessment:   This is a routine wellness examination for [place].  Hearing/Vision screen Hearing Screening - Comments:: Pt denies any hearing issues  Vision Screening - Comments:: Pt follows up with Dr Marica Otter for annual eye exams   Dietary issues and exercise activities discussed: Current Exercise Habits: Home exercise routine, Type of exercise: walking, Time (Minutes): 60, Frequency (Times/Week): 2, Weekly Exercise (Minutes/Week): 120   Goals Addressed             This Visit's Progress    Patient Stated       Drop a few pounds        Depression Screen PHQ 2/9 Scores 04/26/2021 12/22/2020 04/13/2020 10/23/2019 08/26/2018 01/29/2018 11/30/2016  PHQ - 2 Score 0 0 0 0 0 0 0    Fall Risk Fall Risk  04/26/2021 12/22/2020 04/13/2020 10/23/2019 08/26/2018  Falls in the past year? 0 1 0 1 0  Comment - - - - -  Number falls in past yr: 0 0 0 0 0  Injury with Fall? 0 0 0 0 0  Risk for fall due to : Impaired vision - - - -  Follow up Falls prevention discussed Falls evaluation completed Falls prevention discussed Falls evaluation completed Falls evaluation completed    FALL RISK PREVENTION PERTAINING TO THE HOME:  Any stairs in or around the home? Yes  If so, are there any  without handrails? No  Home free  of loose throw rugs in walkways, pet beds, electrical cords, etc? Yes  Adequate lighting in your home to reduce risk of falls? Yes   ASSISTIVE DEVICES UTILIZED TO PREVENT FALLS:  Life alert? No  Use of a cane, walker or w/c? No  Grab bars in the bathroom? Yes  Shower chair or bench in shower? No  Elevated toilet seat or a handicapped toilet? No   TIMED UP AND GO:  Was the test performed? No .  Cognitive Function:     6CIT Screen 04/26/2021  What Year? 0 points  What month? 0 points  What time? 0 points  Count back from 20 0 points  Months in reverse 0 points  Repeat phrase 0 points  Total Score 0    Immunizations Immunization History  Administered Date(s) Administered   DTaP 02/27/2007   Fluad Quad(high Dose 65+) 11/26/2018, 01/03/2021   Hepatitis B 02/27/2007   IPV 02/27/2007   Influenza Split 01/23/2011, 11/14/2012   Influenza, High Dose Seasonal PF 11/09/2015, 11/30/2016, 01/15/2018   Influenza,inj,Quad PF,6+ Mos 12/25/2013   Influenza-Unspecified 11/19/2014, 01/20/2020   PFIZER(Purple Top)SARS-COV-2 Vaccination 03/18/2019, 04/09/2019, 12/28/2019, 07/11/2020   Pfizer Covid-19 Vaccine Bivalent Booster 21yrs & up 01/03/2021   Pneumococcal Conjugate-13 11/14/2012   Pneumococcal Polysaccharide-23 04/14/2015   Td 01/20/2008, 02/05/2018   Tdap 08/19/2020   Zoster Recombinat (Shingrix) 12/26/2016, 04/18/2017   Zoster, Live 04/12/2009    TDAP status: Up to date  Flu Vaccine status: Up to date  Pneumococcal vaccine status: Up to date  Covid-19 vaccine status: Completed vaccines  Qualifies for Shingles Vaccine? Yes   Zostavax completed No   Shingrix Completed?: No.    Education has been provided regarding the importance of this vaccine. Patient has been advised to call insurance company to determine out of pocket expense if they have not yet received this vaccine. Advised may also receive vaccine at local pharmacy or Health Dept.  Verbalized acceptance and understanding.  Screening Tests Health Maintenance  Topic Date Due   URINE MICROALBUMIN  05/31/2021   FOOT EXAM  05/31/2021   HEMOGLOBIN A1C  06/22/2021   OPHTHALMOLOGY EXAM  01/17/2022   COLONOSCOPY (Pts 45-6yrs Insurance coverage will need to be confirmed)  03/13/2026   TETANUS/TDAP  08/20/2030   Pneumonia Vaccine 66+ Years old  Completed   INFLUENZA VACCINE  Completed   COVID-19 Vaccine  Completed   Hepatitis C Screening  Completed   Zoster Vaccines- Shingrix  Completed   HPV VACCINES  Aged Out    Health Maintenance  Health Maintenance Due  Topic Date Due   URINE MICROALBUMIN  05/31/2021    Colorectal cancer screening: Type of screening: Colonoscopy. Completed 03/13/21. Repeat every 5 years   Additional Screening:  Hepatitis C Screening:  Completed 03/06/16  Vision Screening: Recommended annual ophthalmology exams for early detection of glaucoma and other disorders of the eye. Is the patient up to date with their annual eye exam?  Yes  Who is the provider or what is the name of the office in which the patient attends annual eye exams? Dr Marica Otter  If pt is not established with a provider, would they like to be referred to a provider to establish care? No .   Dental Screening: Recommended annual dental exams for proper oral hygiene  Community Resource Referral / Chronic Care Management: CRR required this visit?  No   CCM required this visit?  No      Plan:     I have personally reviewed and  noted the following in the patients chart:   Medical and social history Use of alcohol, tobacco or illicit drugs  Current medications and supplements including opioid prescriptions. Patient is not currently taking opioid prescriptions. Functional ability and status Nutritional status Physical activity Advanced directives List of other physicians Hospitalizations, surgeries, and ER visits in previous 12 months Vitals Screenings to include  cognitive, depression, and falls Referrals and appointments  In addition, I have reviewed and discussed with patient certain preventive protocols, quality metrics, and best practice recommendations. A written personalized care plan for preventive services as well as general preventive health recommendations were provided to patient.     Willette Brace, LPN   07/30/8175   Nurse Notes: None

## 2021-05-29 DIAGNOSIS — Z85828 Personal history of other malignant neoplasm of skin: Secondary | ICD-10-CM | POA: Diagnosis not present

## 2021-05-29 DIAGNOSIS — D1801 Hemangioma of skin and subcutaneous tissue: Secondary | ICD-10-CM | POA: Diagnosis not present

## 2021-05-29 DIAGNOSIS — D485 Neoplasm of uncertain behavior of skin: Secondary | ICD-10-CM | POA: Diagnosis not present

## 2021-05-29 DIAGNOSIS — L814 Other melanin hyperpigmentation: Secondary | ICD-10-CM | POA: Diagnosis not present

## 2021-05-29 DIAGNOSIS — L821 Other seborrheic keratosis: Secondary | ICD-10-CM | POA: Diagnosis not present

## 2021-05-29 DIAGNOSIS — D2339 Other benign neoplasm of skin of other parts of face: Secondary | ICD-10-CM | POA: Diagnosis not present

## 2021-05-29 DIAGNOSIS — L570 Actinic keratosis: Secondary | ICD-10-CM | POA: Diagnosis not present

## 2021-06-22 ENCOUNTER — Encounter: Payer: Self-pay | Admitting: Family Medicine

## 2021-06-22 ENCOUNTER — Ambulatory Visit: Payer: Medicare PPO | Admitting: Family Medicine

## 2021-06-22 ENCOUNTER — Telehealth: Payer: Self-pay

## 2021-06-22 VITALS — BP 120/64 | HR 59 | Temp 97.8°F | Ht 66.0 in | Wt 197.2 lb

## 2021-06-22 DIAGNOSIS — E119 Type 2 diabetes mellitus without complications: Secondary | ICD-10-CM | POA: Diagnosis not present

## 2021-06-22 DIAGNOSIS — Z0000 Encounter for general adult medical examination without abnormal findings: Secondary | ICD-10-CM | POA: Diagnosis not present

## 2021-06-22 DIAGNOSIS — I10 Essential (primary) hypertension: Secondary | ICD-10-CM

## 2021-06-22 DIAGNOSIS — Z125 Encounter for screening for malignant neoplasm of prostate: Secondary | ICD-10-CM

## 2021-06-22 DIAGNOSIS — E7800 Pure hypercholesterolemia, unspecified: Secondary | ICD-10-CM

## 2021-06-22 LAB — LIPID PANEL
Cholesterol: 100 mg/dL (ref 0–200)
HDL: 44.5 mg/dL (ref >39.00)
LDL Cholesterol: 38 mg/dL (ref 0–99)
NonHDL: 55.47
Total CHOL/HDL Ratio: 2
Triglycerides: 89 mg/dL (ref 0.0–149.0)
VLDL: 17.8 mg/dL (ref 0.0–40.0)

## 2021-06-22 LAB — COMPREHENSIVE METABOLIC PANEL
ALT: 25 U/L (ref 0–53)
AST: 26 U/L (ref 0–37)
Albumin: 4.5 g/dL (ref 3.5–5.2)
Alkaline Phosphatase: 63 U/L (ref 39–117)
BUN: 29 mg/dL — ABNORMAL HIGH (ref 6–23)
CO2: 26 mEq/L (ref 19–32)
Calcium: 9.9 mg/dL (ref 8.4–10.5)
Chloride: 101 mEq/L (ref 96–112)
Creatinine, Ser: 0.9 mg/dL (ref 0.40–1.50)
GFR: 85.32 mL/min (ref >60.00)
Glucose, Bld: 98 mg/dL (ref 70–99)
Potassium: 3.7 mEq/L (ref 3.5–5.1)
Sodium: 136 mEq/L (ref 135–145)
Total Bilirubin: 1 mg/dL (ref 0.2–1.2)
Total Protein: 6.8 g/dL (ref 6.0–8.3)

## 2021-06-22 LAB — CBC
HCT: 40.9 % (ref 39.0–52.0)
Hemoglobin: 13.9 g/dL (ref 13.0–17.0)
MCHC: 34 g/dL (ref 30.0–36.0)
MCV: 91 fl (ref 78.0–100.0)
Platelets: 189 10*3/uL (ref 150.0–400.0)
RBC: 4.5 Mil/uL (ref 4.22–5.81)
RDW: 14.3 % (ref 11.5–15.5)
WBC: 6.9 10*3/uL (ref 4.0–10.5)

## 2021-06-22 LAB — MICROALBUMIN / CREATININE URINE RATIO
Creatinine,U: 56.9 mg/dL
Microalb Creat Ratio: 1.2 mg/g (ref 0.0–30.0)
Microalb, Ur: <0.7 mg/dL (ref 0.0–1.9)

## 2021-06-22 LAB — PSA, MEDICARE: PSA: 0.68 ng/ml (ref 0.10–4.00)

## 2021-06-22 LAB — HEMOGLOBIN A1C: Hgb A1c MFr Bld: 6.8 % — ABNORMAL HIGH (ref 4.6–6.5)

## 2021-06-22 MED ORDER — FREESTYLE LITE TEST VI STRP: ORAL_STRIP | 3 refills | Status: DC

## 2021-06-22 MED ORDER — DICLOFENAC SODIUM 75 MG PO TBEC: 75.0000 mg | DELAYED_RELEASE_TABLET | Freq: Two times a day (BID) | ORAL | 2 refills | Status: DC | PRN

## 2021-06-22 NOTE — Progress Notes (Signed)
Office Note ?06/22/2021 ? ?CC:  ?Chief Complaint  ?Patient presents with  ? Diabetes  ? Hyperlipidemia  ?  Pt is fasting  ? Hypertension  ? ?Patient is a 70 y.o. male who is here for annual health maintenance exam and 53-monthfollow-up diabetes, hypertension, hyperlipidemia. ?I last saw him 12/22/20.  ?A/P as of that visit: ?"#1: Diabetes, well controlled.  He is having some intermittent gastrointestinal upset and we decided to switch to extended release metformin at 1000 mg at supper. ?Hemoglobin A1c checked today. ? ?2.  Hypertension, well controlled on HCTZ 25 a day.  He takes supplemental potassium for history of hypokalemia on this medication.  Checking electrolytes and creatinine today. ?  ?#3 hyperlipidemia.  Taking atorvastatin 40 mg/day.  Last LDL was 32 6 months ago.  He is fasting today so we will check lipid panel and hepatic panel. ?  ?#4: Vaccines: ALL UTD. ?Prostate ca screening: PSA UTD, rpt 05/2021. ?Colon ca screening: next colonoscopy due--any time now (as of 11/2020)--he'll call them. ?Hep c screen neg 2018. ?Lung ca screening: does not qualify. ?AAA screening: does not qualify." ? ?INTERIM HX: ?Hemoglobin A1c 6.8% last visit, triglycerides 180. ?No changes made. ? ?He feels well. ?Did some [place]ing [place] since I last saw him. ? ?Changing to the extended release metformin has helped minimize GI side effects. ?He has occasional tingling in the arches of his feet but nothing persistent and nothing in the toes. ? ?He is having ongoing pain in the right knee for which he has been followed by orthopedics. ?Going down stairs or standing for prolonged period seem to exacerbate it the worst.  No swelling. ?Visco injections no help. ? ?Past Medical History:  ?Diagnosis Date  ? CAD (coronary artery disease)   ? CT demonstrated nonobstructive plaques (this test was prompted by borderline ETT.  ? [place] 07/2020  [place]  ?  Diabetes mellitus [place] complication [place] dx[place] 75/0354 ? Fasting gluc 136 and HbA1c 6.7% at diagnosis.   ? Diverticulosis   ? GERD (gastroesophageal reflux disease)   ? occ  ? Hepatic steatosis 07/2020  ? noted on CT abd/pelv done for GE illness  ? History of colitis 08/06/2020  ? presumed infectious. [facility]long segment circumferential wall thickening of descending colon  ? History of nonmelanoma skin cancer   ? BCC  ? Hx of adenomatous colonic polyps   ? Hyperlipidemia   ? Hypertension   ? w/hypertensive retinopathy  ? Internal hemorrhoids   ? Obesity, Class II, BMI 35-39.9   ? Osteoarthritis of both knees   ? mod to severe 2022, steroid injections by ortho helped some.  Visco injections on the right --no significant help.  ? ? ?Past Surgical History:  ?Procedure Laterality Date  ? CARDIOVASCULAR STRESS TEST  02/2013  ? LexiScan/low-level exercise-Myoview (02/2013): No ischemia, EF 53%, occ symptomatic PVCs.  ? COLONOSCOPY    ? COLONOSCOPY W/ POLYPECTOMY  2005;08/21/10;11/29/15  ? adenomatous; also mild diverticulosis; recall 2017.  iFOB neg 09/2011.  11/2015: tubular adenoma x 1. Recall 5 yrs (11/2020)  ? HAMMER TOE SURGERY Right 05/24/2020  ? POLYPECTOMY    ? remvoal sebaceous cyst  15-20 years ago  ? TONSILLECTOMY    ? ? ?Family History  ?Problem Relation Age of Onset  ? Heart attack Mother   ? Diabetes Father   ? Stroke Father   ? Stroke Other   ?  Colon cancer Neg Hx   ? Colon polyps Neg Hx   ? Rectal cancer Neg Hx   ? Stomach cancer Neg Hx   ? Esophageal cancer Neg Hx   ? ? ?Social History  ? ?Socioeconomic History  ? Marital status: Married  ?  Spouse name: Not on file  ? Number of children: Not on file  ? Years of education: Not on file  ? Highest education level: Bachelor's degree (e.g., BA, AB, BS)  ?Occupational History  ? Not on file  ?Tobacco Use  ? Smoking status: Never  ? Smokeless tobacco: Never  ?Vaping Use  ? Vaping Use: Never used  ?Substance and Sexual Activity   ? Alcohol use: Yes  ?  Alcohol/week: 3.0 standard drinks  ?  Types: 3 Shots of liquor per week  ?  Comment: occ  ? Drug use: No  ? Sexual activity: Yes  ?Other Topics Concern  ? Not on file  ?Social History Narrative  ? Married x 40 yrs.  ? 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  ? No T/A/Ds.  ? ?Social Determinants of Health  ? ?Financial Resource Strain: Low Risk   ? Difficulty of Paying Living Expenses: Not hard at all  ?Food Insecurity: No Food Insecurity  ? Worried About Charity fundraiser in the Last Year: Never true  ? Ran Out of Food in the Last Year: Never true  ?Transportation Needs: No Transportation Needs  ? Lack of Transportation (Medical): No  ? Lack of Transportation (Non-Medical): No  ?Physical Activity: Sufficiently Active  ? Days of Exercise per Week: 4 days  ? Minutes of Exercise per Session: 50 min  ?Stress: No Stress Concern Present  ? Feeling of Stress : Only a little  ?Social Connections: Socially Integrated  ? Frequency of Communication with Friends and Family: More than three times a week  ? Frequency of Social Gatherings with Friends and Family: More than three times a week  ? Attends Religious Services: More than 4 times per year  ? Active Member of Clubs or Organizations: Yes  ? Attends Archivist Meetings: More than 4 times per year  ? Marital Status: Married  ?Intimate Partner Violence: Not At Risk  ? Fear of Current or Ex-Partner: No  ? Emotionally Abused: No  ? Physically Abused: No  ? Sexually Abused: No  ? ? ?Outpatient Medications Prior to Visit  ?Medication Sig Dispense Refill  ? ASPIRIN 81 PO Take 81 mg by mouth as needed.    ? atorvastatin (LIPITOR) 40 MG tablet Take 1 tablet (40 mg total) by mouth daily. 90 tablet 3  ? hydrochlorothiazide (HYDRODIURIL) 25 MG tablet Take 1 tablet (25 mg total) by mouth daily. 90 tablet 3  ? metFORMIN (GLUCOPHAGE XR) 500 MG 24 hr tablet 2 tabs po daily with supper 180 tablet 3  ? potassium chloride SA  (KLOR-CON M20) 20 MEQ tablet Take 2 tablets (40 mEq total) by mouth 2 (two) times daily. 360 tablet 3  ? diclofenac (VOLTAREN) 75 MG EC tablet TAKE ONE TABLET BY MOUTH TWICE A DAY AS NEEDED 60 tablet 2  ? glucose blood (FREESTYLE LITE) test strip USE AS INSTRUCTED 50 each 3  ? HYDROcodone bit-homatropine (HYCODAN) 5-1.5 MG/5ML syrup 1-2 tsp po bid prn cough 120 mL 0  ? ?No facility-administered medications prior to visit.  ? ? ?No Known Allergies ? ?ROS ?Review of Systems  ?Constitutional:  Negative for appetite change, chills, fatigue  and fever.  ?HENT:  Negative for congestion, dental problem, ear pain and sore throat.   ?Eyes:  Negative for discharge, redness and visual disturbance.  ?Respiratory:  Negative for cough, chest tightness, shortness of breath and wheezing.   ?Cardiovascular:  Negative for chest pain, palpitations and leg swelling.  ?Gastrointestinal:  Negative for abdominal pain, blood in stool, diarrhea, nausea and vomiting.  ?Genitourinary:  Negative for difficulty urinating, dysuria, flank pain, frequency, hematuria and urgency.  ?Musculoskeletal:  Positive for arthralgias (R>L knee pain). Negative for back pain, joint swelling, myalgias and neck stiffness.  ?Skin:  Negative for pallor and rash.  ?Neurological:  Negative for dizziness, speech difficulty, weakness and headaches.  ?Hematological:  Negative for adenopathy. Does not bruise/bleed easily.  ?Psychiatric/Behavioral:  Negative for confusion and sleep disturbance. The patient is not nervous/anxious.   ? ?PE; ? ?  06/22/2021  ?  9:41 AM 04/20/2021  ? 11:11 AM 03/13/2021  ?  8:53 AM  ?Vitals with BMI  ?Height '5\' 6"'$  '5\' 6"'$    ?Weight 197 lbs 3 oz 193 lbs   ?BMI 31.84 31.17   ?Systolic 160 109 323  ?Diastolic 64 70 76  ?Pulse 59  53  ? ?Gen: Alert, well appearing.  Patient is oriented to person, place, time, and situation. ?AFFECT: pleasant, lucid thought and speech. ?ENT: Ears: EACs clear, normal epithelium.  TMs with good light reflex and landmarks  bilaterally.  Eyes: no injection, icteris, swelling, or exudate.  EOMI, PERRLA. ?Nose: no drainage or turbinate edema/swelling.  No injection or focal lesion.  Mouth: lips without lesion/swelling.  Oral mucos

## 2021-06-22 NOTE — Telephone Encounter (Signed)
New rx sent for pt ?

## 2021-06-22 NOTE — Addendum Note (Signed)
Addended by: Deveron Furlong D on: 06/22/2021 11:33 AM ? ? Modules accepted: Orders ? ?

## 2021-06-22 NOTE — Patient Instructions (Signed)
Health Maintenance, Male Adopting a healthy lifestyle and getting preventive care are important in promoting health and wellness. Ask your health care provider about: The right schedule for you to have regular tests and exams. Things you can do on your own to prevent diseases and keep yourself healthy. What should I know about diet, weight, and exercise? Eat a healthy diet  Eat a diet that includes plenty of vegetables, fruits, low-fat dairy products, and lean protein. Do not eat a lot of foods that are high in solid fats, added sugars, or sodium. Maintain a healthy weight Body mass index (BMI) is a measurement that can be used to identify possible weight problems. It estimates body fat based on height and weight. Your health care provider can help determine your BMI and help you achieve or maintain a healthy weight. Get regular exercise Get regular exercise. This is one of the most important things you can do for your health. Most adults should: Exercise for at least 150 minutes each week. The exercise should increase your heart rate and make you sweat (moderate-intensity exercise). Do strengthening exercises at least twice a week. This is in addition to the moderate-intensity exercise. Spend less time sitting. Even light physical activity can be beneficial. Watch cholesterol and blood lipids Have your blood tested for lipids and cholesterol at 73 years of age, then have this test every 5 years. You may need to have your cholesterol levels checked more often if: Your lipid or cholesterol levels are high. You are older than 73 years of age. You are at high risk for heart disease. What should I know about cancer screening? Many types of cancers can be detected early and may often be prevented. Depending on your health history and family history, you may need to have cancer screening at various ages. This may include screening for: Colorectal cancer. Prostate cancer. Skin cancer. Lung  cancer. What should I know about heart disease, diabetes, and high blood pressure? Blood pressure and heart disease High blood pressure causes heart disease and increases the risk of stroke. This is more likely to develop in people who have high blood pressure readings or are overweight. Talk with your health care provider about your target blood pressure readings. Have your blood pressure checked: Every 3-5 years if you are 18-39 years of age. Every year if you are 40 years old or older. If you are between the ages of 65 and 75 and are a current or former smoker, ask your health care provider if you should have a one-time screening for abdominal aortic aneurysm (AAA). Diabetes Have regular diabetes screenings. This checks your fasting blood sugar level. Have the screening done: Once every three years after age 45 if you are at a normal weight and have a low risk for diabetes. More often and at a younger age if you are overweight or have a high risk for diabetes. What should I know about preventing infection? Hepatitis B If you have a higher risk for hepatitis B, you should be screened for this virus. Talk with your health care provider to find out if you are at risk for hepatitis B infection. Hepatitis C Blood testing is recommended for: Everyone born from 1945 through 1965. Anyone with known risk factors for hepatitis C. Sexually transmitted infections (STIs) You should be screened each year for STIs, including gonorrhea and chlamydia, if: You are sexually active and are younger than 73 years of age. You are older than 73 years of age and your   health care provider tells you that you are at risk for this type of infection. Your sexual activity has changed since you were last screened, and you are at increased risk for chlamydia or gonorrhea. Ask your health care provider if you are at risk. Ask your health care provider about whether you are at high risk for HIV. Your health care provider  may recommend a prescription medicine to help prevent HIV infection. If you choose to take medicine to prevent HIV, you should first get tested for HIV. You should then be tested every 3 months for as long as you are taking the medicine. Follow these instructions at home: Alcohol use Do not drink alcohol if your health care provider tells you not to drink. If you drink alcohol: Limit how much you have to 0-2 drinks a day. Know how much alcohol is in your drink. In the [place] one drink equals one 12 oz bottle of beer (355 mL), one 5 oz glass of wine (148 mL), or one 1 oz glass of hard liquor (44 mL). Lifestyle Do not use any products that contain nicotine or tobacco. These products include cigarettes, chewing tobacco, and vaping devices, such as e-cigarettes. If you need help quitting, ask your health care provider. Do not use street drugs. Do not share needles. Ask your health care provider for help if you need support or information about quitting drugs. General instructions Schedule regular health, dental, and eye exams. Stay current with your vaccines. Tell your health care provider if: You often feel depressed. You have ever been abused or do not feel safe at home. Summary Adopting a healthy lifestyle and getting preventive care are important in promoting health and wellness. Follow your health care provider's instructions about healthy diet, exercising, and getting tested or screened for diseases. Follow your health care provider's instructions on monitoring your cholesterol and blood pressure. This information is not intended to replace advice given to you by your health care provider. Make sure you discuss any questions you have with your health care provider. Document Revised: 07/04/2020 Document Reviewed: 07/04/2020 Elsevier Patient Education  2023 Elsevier Inc.  

## 2021-06-22 NOTE — Telephone Encounter (Signed)
[facility] called stating that the Rx for test strips need to have how often pt is to check CBG in the instructions. Please resend a new Rx with information needed ?

## 2021-08-21 DIAGNOSIS — M170 Bilateral primary osteoarthritis of knee: Secondary | ICD-10-CM | POA: Diagnosis not present

## 2021-11-06 ENCOUNTER — Telehealth: Payer: Medicare PPO | Admitting: Family Medicine

## 2021-11-06 ENCOUNTER — Telehealth: Payer: Medicare PPO | Admitting: Physician Assistant

## 2021-11-06 DIAGNOSIS — B9689 Other specified bacterial agents as the cause of diseases classified elsewhere: Secondary | ICD-10-CM | POA: Diagnosis not present

## 2021-11-06 DIAGNOSIS — J019 Acute sinusitis, unspecified: Secondary | ICD-10-CM | POA: Diagnosis not present

## 2021-11-06 MED ORDER — AMOXICILLIN-POT CLAVULANATE 875-125 MG PO TABS: 1.0000 | ORAL_TABLET | Freq: Two times a day (BID) | ORAL | 0 refills | Status: DC

## 2021-11-06 NOTE — Progress Notes (Signed)
Virtual Visit Consent   Michael Hodge, you are scheduled for a virtual visit with a [facility] provider today. Just as with appointments in the office, your consent must be obtained to participate. Your consent will be active for this visit and any virtual visit you may have with one of our providers in the next 365 days. If you have a MyChart account, a copy of this consent can be sent to you electronically.  As this is a virtual visit, video technology does not allow for your provider to perform a traditional examination. This may limit your provider's ability to fully assess your condition. If your provider identifies any concerns that need to be evaluated in person or the need to arrange testing (such as labs, EKG, etc.), we will make arrangements to do so. Although advances in technology are sophisticated, we cannot ensure that it will always work on either your end or our end. If the connection with a video visit is poor, the visit may have to be switched to a telephone visit. With either a video or telephone visit, we are not always able to ensure that we have a secure connection.  By engaging in this virtual visit, you consent to the provision of healthcare and authorize for your insurance to be billed (if applicable) for the services provided during this visit. Depending on your insurance coverage, you may receive a charge related to this service.  I need to obtain your verbal consent now. Are you willing to proceed with your visit today? Michael Hodge has provided verbal consent on 11/06/2021 for a virtual visit (video or telephone). Mar Daring, PA-C  Date: 11/06/2021 8:25 AM  Virtual Visit via Video Note   I, Mar Daring, connected with  Michael Hodge  ([id number], 09/07/1950) on 11/06/21 at  8:15 AM EDT by a video-enabled telemedicine application and verified that I am speaking with the correct person using two identifiers.  Location: Patient: Virtual Visit Location  Patient: Home Provider: Virtual Visit Location Provider: Home Office   I discussed the limitations of evaluation and management by telemedicine and the availability of in person appointments. The patient expressed understanding and agreed to proceed.    History of Present Illness: Michael Hodge is a 71 y.o. who identifies as a male who was assigned male at birth, and is being seen today for URI symptoms.  HPI: URI  This is a new problem. The current episode started in the past 7 days (10/31/21). The problem has been gradually worsening. Associated symptoms include congestion, coughing, headaches, a plugged ear sensation, rhinorrhea (post nasal drainage), sinus pain and a sore throat. Pertinent negatives include no ear pain. He has tried steam and increased fluids (saline nasal rinses, coridin hbp, mucinex, hycodan cough syrup) for the symptoms. The treatment provided mild relief.  At home Covid 19 testing done on 09/06, 09/08, and 09/10 all negative.   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   ARTHRITIS 01/20/2008   ALLERGIC RHINITIS 10/30/2006    Allergies: No Known Allergies Medications:  Current  Outpatient Medications:    amoxicillin-clavulanate (AUGMENTIN) 875-125 MG tablet, Take 1 tablet by mouth 2 (two) times daily., Disp: 20 tablet, Rfl: 0   ASPIRIN 81 PO, Take 81 mg by mouth as needed., Disp: , Rfl:    atorvastatin (LIPITOR) 40 MG tablet, Take 1 tablet (40 mg total) by mouth daily., Disp: 90 tablet, Rfl: 3    diclofenac (VOLTAREN) 75 MG EC tablet, Take 1 tablet (75 mg total) by mouth 2 (two) times daily as needed., Disp: 60 tablet, Rfl: 2   glucose blood (FREESTYLE LITE) test strip, USE TO CHECK BLOOD SUGAR 1-2 TIMES DAILY AS INSTRUCTED., Disp: 50 each, Rfl: 3   hydrochlorothiazide (HYDRODIURIL) 25 MG tablet, Take 1 tablet (25 mg total) by mouth daily., Disp: 90 tablet, Rfl: 3   metFORMIN (GLUCOPHAGE XR) 500 MG 24 hr tablet, 2 tabs po daily with supper, Disp: 180 tablet, Rfl: 3   potassium chloride SA (KLOR-CON M20) 20 MEQ tablet, Take 2 tablets (40 mEq total) by mouth 2 (two) times daily., Disp: 360 tablet, Rfl: 3  Observations/Objective: Patient is well-developed, well-nourished in no acute distress.  Resting comfortably at home.  Head is normocephalic, atraumatic.  No labored breathing.  Speech is clear and coherent with logical content.  Patient is alert and oriented at baseline.    Assessment and Plan: 1. Acute bacterial sinusitis - amoxicillin-clavulanate (AUGMENTIN) 875-125 MG tablet; Take 1 tablet by mouth 2 (two) times daily.  Dispense: 20 tablet; Refill: 0  - Worsening symptoms that have not responded to OTC medications.  - Will give Augmentin - Continue allergy medications.  - Steam and humidifier can help - Stay well hydrated and get plenty of rest.  - Seek in person evaluation if no symptom improvement or if symptoms worsen   Follow Up Instructions: I discussed the assessment and treatment plan with the patient. The patient was provided an opportunity to ask questions and all were answered. The patient agreed with the plan and demonstrated an understanding of the instructions.  A copy of instructions were sent to the patient via MyChart unless otherwise noted below.    The patient was advised to call back or seek an in-person evaluation if the symptoms worsen or if the condition fails to improve as anticipated.  Time:  I spent 12 minutes with the patient via telehealth  technology discussing the above problems/concerns.    Mar Daring, PA-C

## 2021-11-06 NOTE — Patient Instructions (Signed)
Tania Ade, thank you for joining Mar Daring, PA-C for today's virtual visit.  While this provider is not your primary care provider (PCP), if your PCP is located in our provider database this encounter information will be shared with them immediately following your visit.  Consent: (Patient) Michael Hodge provided verbal consent for this virtual visit at the beginning of the encounter.  Current Medications:  Current Outpatient Medications:    amoxicillin-clavulanate (AUGMENTIN) 875-125 MG tablet, Take 1 tablet by mouth 2 (two) times daily., Disp: 20 tablet, Rfl: 0   ASPIRIN 81 PO, Take 81 mg by mouth as needed., Disp: , Rfl:    atorvastatin (LIPITOR) 40 MG tablet, Take 1 tablet (40 mg total) by mouth daily., Disp: 90 tablet, Rfl: 3   diclofenac (VOLTAREN) 75 MG EC tablet, Take 1 tablet (75 mg total) by mouth 2 (two) times daily as needed., Disp: 60 tablet, Rfl: 2   glucose blood (FREESTYLE LITE) test strip, USE TO CHECK BLOOD SUGAR 1-2 TIMES DAILY AS INSTRUCTED., Disp: 50 each, Rfl: 3   hydrochlorothiazide (HYDRODIURIL) 25 MG tablet, Take 1 tablet (25 mg total) by mouth daily., Disp: 90 tablet, Rfl: 3   metFORMIN (GLUCOPHAGE XR) 500 MG 24 hr tablet, 2 tabs po daily with supper, Disp: 180 tablet, Rfl: 3   potassium chloride SA (KLOR-CON M20) 20 MEQ tablet, Take 2 tablets (40 mEq total) by mouth 2 (two) times daily., Disp: 360 tablet, Rfl: 3   Medications ordered in this encounter:  Meds ordered this encounter  Medications   amoxicillin-clavulanate (AUGMENTIN) 875-125 MG tablet    Sig: Take 1 tablet by mouth 2 (two) times daily.    Dispense:  20 tablet    Refill:  0    Order Specific Question:   Supervising Provider    Answer:   Chase Picket [id number]     *If you need refills on other medications prior to your next appointment, please contact your pharmacy*  Follow-Up: Call back or seek an in-person evaluation if the symptoms worsen or if the condition fails to  improve as anticipated.  Other Instructions Sinus Infection, Adult A sinus infection, also called sinusitis, is inflammation of your sinuses. Sinuses are hollow spaces in the bones around your face. Your sinuses are located: Around your eyes. In the middle of your forehead. Behind your nose. In your cheekbones. Mucus normally drains out of your sinuses. When your nasal tissues become inflamed or swollen, mucus can become trapped or blocked. This allows bacteria, viruses, and fungi to grow, which leads to infection. Most infections of the sinuses are caused by a virus. A sinus infection can develop quickly. It can last for up to 4 weeks (acute) or for more than 12 weeks (chronic). A sinus infection often develops after a cold. What are the causes? This condition is caused by anything that creates swelling in the sinuses or stops mucus from draining. This includes: Allergies. Asthma. Infection from bacteria or viruses. Deformities or blockages in your nose or sinuses. Abnormal growths in the nose (nasal polyps). Pollutants, such as chemicals or irritants in the air. Infection from fungi. This is rare. What increases the risk? You are more likely to develop this condition if you: Have a weak body defense system (immune system). Do a lot of swimming or diving. Overuse nasal sprays. Smoke. What are the signs or symptoms? The main symptoms of this condition are pain and a feeling of pressure around the affected sinuses. Other symptoms include: Stuffy  nose or congestion that makes it difficult to breathe through your nose. Thick yellow or greenish drainage from your nose. Tenderness, swelling, and warmth over the affected sinuses. A cough that may get worse at night. Decreased sense of smell and taste. Extra mucus that collects in the throat or the back of the nose (postnasal drip) causing a sore throat or bad breath. Tiredness (fatigue). Fever. How is this diagnosed? This condition is  diagnosed based on: Your symptoms. Your medical history. A physical exam. Tests to find out if your condition is acute or chronic. This may include: Checking your nose for nasal polyps. Viewing your sinuses using a device that has a light (endoscope). Testing for allergies or bacteria. Imaging tests, such as an MRI or CT scan. In rare cases, a bone biopsy may be done to rule out more serious types of fungal sinus disease. How is this treated? Treatment for a sinus infection depends on the cause and whether your condition is chronic or acute. If caused by a virus, your symptoms should go away on their own within 10 days. You may be given medicines to relieve symptoms. They include: Medicines that shrink swollen nasal passages (decongestants). A spray that eases inflammation of the nostrils (topical intranasal corticosteroids). Rinses that help get rid of thick mucus in your nose (nasal saline washes). Medicines that treat allergies (antihistamines). Over-the-counter pain relievers. If caused by bacteria, your health care provider may recommend waiting to see if your symptoms improve. Most bacterial infections will get better without antibiotic medicine. You may be given antibiotics if you have: A severe infection. A weak immune system. If caused by narrow nasal passages or nasal polyps, surgery may be needed. Follow these instructions at home: Medicines Take, use, or apply over-the-counter and prescription medicines only as told by your health care provider. These may include nasal sprays. If you were prescribed an antibiotic medicine, take it as told by your health care provider. Do not stop taking the antibiotic even if you start to feel better. Hydrate and humidify  Drink enough fluid to keep your urine pale yellow. Staying hydrated will help to thin your mucus. Use a cool mist humidifier to keep the humidity level in your home above 50%. Inhale steam for 10-15 minutes, 3-4 times a  day, or as told by your health care provider. You can do this in the bathroom while a hot shower is running. Limit your exposure to cool or dry air. Rest Rest as much as possible. Sleep with your head raised (elevated). Make sure you get enough sleep each night. General instructions  Apply a warm, moist washcloth to your face 3-4 times a day or as told by your health care provider. This will help with discomfort. Use nasal saline washes as often as told by your health care provider. Wash your hands often with soap and water to reduce your exposure to germs. If soap and water are not available, use hand sanitizer. Do not smoke. Avoid being around people who are smoking (secondhand smoke). Keep all follow-up visits. This is important. Contact a health care provider if: You have a fever. Your symptoms get worse. Your symptoms do not improve within 10 days. Get help right away if: You have a severe headache. You have persistent vomiting. You have severe pain or swelling around your face or eyes. You have vision problems. You develop confusion. Your neck is stiff. You have trouble breathing. These symptoms may be an emergency. Get help right away. Call 911.  Do not wait to see if the symptoms will go away. Do not drive yourself to the hospital. Summary A sinus infection is soreness and inflammation of your sinuses. Sinuses are hollow spaces in the bones around your face. This condition is caused by nasal tissues that become inflamed or swollen. The swelling traps or blocks the flow of mucus. This allows bacteria, viruses, and fungi to grow, which leads to infection. If you were prescribed an antibiotic medicine, take it as told by your health care provider. Do not stop taking the antibiotic even if you start to feel better. Keep all follow-up visits. This is important. This information is not intended to replace advice given to you by your health care provider. Make sure you discuss any  questions you have with your health care provider. Document Revised: 01/17/2021 Document Reviewed: 01/17/2021 Elsevier Patient Education  [employer].    If you have been instructed to have an in-person evaluation today at a local Urgent Care facility, please use the link below. It will take you to a list of all of our available [facility], including address, phone number and hours of operation. Please do not delay care.  [facility]  If you or a family member do not have a primary care provider, use the link below to schedule a visit and establish care. When you choose a [facility] primary care physician or advanced practice provider, you gain a long-term partner in health. Find a Primary Care Provider  Learn more about [facility] in-office and virtual care options: [facility]

## 2021-12-22 ENCOUNTER — Encounter: Payer: Self-pay | Admitting: Family Medicine

## 2021-12-22 ENCOUNTER — Ambulatory Visit: Payer: Medicare PPO | Admitting: Family Medicine

## 2021-12-22 VITALS — BP 126/76 | HR 76 | Temp 97.8°F | Ht 66.0 in | Wt 195.2 lb

## 2021-12-22 DIAGNOSIS — I10 Essential (primary) hypertension: Secondary | ICD-10-CM

## 2021-12-22 DIAGNOSIS — Z23 Encounter for immunization: Secondary | ICD-10-CM | POA: Diagnosis not present

## 2021-12-22 DIAGNOSIS — I4891 Unspecified atrial fibrillation: Secondary | ICD-10-CM | POA: Diagnosis not present

## 2021-12-22 DIAGNOSIS — E119 Type 2 diabetes mellitus without complications: Secondary | ICD-10-CM | POA: Diagnosis not present

## 2021-12-22 DIAGNOSIS — R002 Palpitations: Secondary | ICD-10-CM | POA: Diagnosis not present

## 2021-12-22 DIAGNOSIS — E7800 Pure hypercholesterolemia, unspecified: Secondary | ICD-10-CM | POA: Diagnosis not present

## 2021-12-22 DIAGNOSIS — R000 Tachycardia, unspecified: Secondary | ICD-10-CM | POA: Diagnosis not present

## 2021-12-22 LAB — POCT GLYCOSYLATED HEMOGLOBIN (HGB A1C)
HbA1c POC (<> result, manual entry): 6.2 % (ref 4.0–5.6)
HbA1c, POC (controlled diabetic range): 6.2 % (ref 0.0–7.0)
HbA1c, POC (prediabetic range): 6.2 % (ref 5.7–6.4)
Hemoglobin A1C: 6.2 % — AB (ref 4.0–5.6)

## 2021-12-22 LAB — COMPREHENSIVE METABOLIC PANEL
ALT: 22 U/L (ref 0–53)
AST: 22 U/L (ref 0–37)
Albumin: 4.4 g/dL (ref 3.5–5.2)
Alkaline Phosphatase: 61 U/L (ref 39–117)
BUN: 30 mg/dL — ABNORMAL HIGH (ref 6–23)
CO2: 29 mEq/L (ref 19–32)
Calcium: 9.7 mg/dL (ref 8.4–10.5)
Chloride: 100 mEq/L (ref 96–112)
Creatinine, Ser: 1.09 mg/dL (ref 0.40–1.50)
GFR: 67.56 mL/min (ref >60.00)
Glucose, Bld: 104 mg/dL — ABNORMAL HIGH (ref 70–99)
Potassium: 3.5 mEq/L (ref 3.5–5.1)
Sodium: 137 mEq/L (ref 135–145)
Total Bilirubin: 1 mg/dL (ref 0.2–1.2)
Total Protein: 6.6 g/dL (ref 6.0–8.3)

## 2021-12-22 LAB — LIPID PANEL
Cholesterol: 88 mg/dL (ref 0–200)
HDL: 37.4 mg/dL — ABNORMAL LOW (ref >39.00)
LDL Cholesterol: 33 mg/dL (ref 0–99)
NonHDL: 50.49
Total CHOL/HDL Ratio: 2
Triglycerides: 85 mg/dL (ref 0.0–149.0)
VLDL: 17 mg/dL (ref 0.0–40.0)

## 2021-12-22 LAB — TSH: TSH: 3.35 u[IU]/mL (ref 0.35–5.50)

## 2021-12-22 LAB — MAGNESIUM: Magnesium: 1.6 mg/dL (ref 1.5–2.5)

## 2021-12-22 MED ORDER — POTASSIUM CHLORIDE CRYS ER 20 MEQ PO TBCR
40.0000 meq | EXTENDED_RELEASE_TABLET | Freq: Two times a day (BID) | ORAL | 3 refills | Status: DC
Start: 2021-12-22 — End: 2022-12-07

## 2021-12-22 MED ORDER — APIXABAN (ELIQUIS) VTE STARTER PACK (10MG AND 5MG): ORAL_TABLET | ORAL | 0 refills | Status: DC

## 2021-12-22 MED ORDER — ATORVASTATIN CALCIUM 40 MG PO TABS: 40.0000 mg | ORAL_TABLET | Freq: Every day | ORAL | 3 refills | Status: DC

## 2021-12-22 MED ORDER — METOPROLOL TARTRATE 25 MG PO TABS: 25.0000 mg | ORAL_TABLET | Freq: Two times a day (BID) | ORAL | 1 refills | Status: DC

## 2021-12-22 MED ORDER — METFORMIN HCL ER 500 MG PO TB24: ORAL_TABLET | ORAL | 3 refills | Status: DC

## 2021-12-22 MED ORDER — HYDROCHLOROTHIAZIDE 25 MG PO TABS
25.0000 mg | ORAL_TABLET | Freq: Every day | ORAL | 3 refills | Status: DC
Start: 2021-12-22 — End: 2022-12-07

## 2021-12-22 NOTE — Progress Notes (Signed)
OFFICE VISIT  12/22/2021  CC:  Chief Complaint  Patient presents with   Diabetes   Hypertension   Hyperlipidemia    Pt is fasting   Patient is a 71 y.o. male who presents for 62-monthfollow-up diabetes, hypertension, and hyperlipidemia.  INTERIM HX: In the last 10 days or so Michael Hodge had 2 episodes of feeling palpitations, describes a vague indigestion feeling in the upper chest.  No chest aching or pressure.  Some mild left shoulder ache.  He checked his pulse during the palpitations and it was in the 150s.  No dizziness, no diaphoresis, no nausea.  Episodes were fairly brief.  Otherwise he has been feeling well. He only occasionally drinks caffeine.  Past Medical History:  Diagnosis Date   CAD (coronary artery disease)    CT demonstrated nonobstructive plaques (this test was prompted by borderline ETT.   Colitis 07/2020   [place]   Diabetes mellitus without complication ([place] dx'd 78/3419  Fasting gluc 136 and HbA1c 6.7% at diagnosis.    Diverticulosis    GERD (gastroesophageal reflux disease)    occ   Hepatic steatosis 07/2020   noted on CT abd/pelv done for GE illness   History of colitis 08/06/2020   presumed infectious. [facility] circumferential wall thickening of descending colon   History of nonmelanoma skin cancer    BCC   Hx of adenomatous colonic polyps    Hyperlipidemia    Hypertension    w/hypertensive retinopathy   Internal hemorrhoids    Obesity, Class II, BMI 35-39.9    Osteoarthritis of both knees    mod to severe 2022, steroid injections by ortho helped some.  Visco injections on the right --no significant help.    Past Surgical History:  Procedure Laterality Date   CARDIOVASCULAR STRESS TEST  02/2013   LexiScan/low-level exercise-Myoview (02/2013): No ischemia, EF 53%, occ symptomatic PVCs.   COLONOSCOPY  02/2021   2023 normal.  Recall 5 yrs   COLONOSCOPY W/  POLYPECTOMY  2005;08/21/10;11/29/15   adenomatous; also mild diverticulosis; recall 2017.  iFOB neg 09/2011.  11/2015: tubular adenoma x 1. 02/2021 normal->recall 592yr  HAMMER TOE SURGERY Right 05/24/2020   remvoal sebaceous cyst  15-20 years ago   TONSILLECTOMY      Outpatient Medications Prior to Visit  Medication Sig Dispense Refill   glucose blood (FREESTYLE LITE) test strip USE TO CHECK BLOOD SUGAR 1-2 TIMES DAILY AS INSTRUCTED. 50 each 3   ASPIRIN 81 PO Take 81 mg by mouth as needed.     diclofenac (VOLTAREN) 75 MG EC tablet Take 1 tablet (75 mg total) by mouth 2 (two) times daily as needed. 60 tablet 2   amoxicillin-clavulanate (AUGMENTIN) 875-125 MG tablet Take 1 tablet by mouth 2 (two) times daily. (Patient not taking: Reported on 12/22/2021) 20 tablet 0   atorvastatin (LIPITOR) 40 MG tablet Take 1 tablet (40 mg total) by mouth daily. 90 tablet 3   hydrochlorothiazide (HYDRODIURIL) 25 MG tablet Take 1 tablet (25 mg total) by mouth daily. 90 tablet 3   metFORMIN (GLUCOPHAGE XR) 500 MG 24 hr tablet 2 tabs po daily with supper 180 tablet 3   potassium chloride SA (KLOR-CON M20) 20 MEQ tablet Take 2 tablets (40 mEq total) by mouth 2 (two) times daily. 360 tablet 3   No facility-administered medications prior to visit.    No Known Allergies  ROS As per HPI  PE:    12/22/2021    8:20 AM 06/22/2021    9:41 AM 04/20/2021   11:11 AM  Vitals with BMI  Height '5\' 6"'$  '5\' 6"'$  '5\' 6"'$   Weight 195 lbs 3 oz 197 lbs 3 oz 193 lbs  BMI 31.52 47.42 59.56  Systolic 387 564 332  Diastolic 76 64 70  Pulse 76 59      Physical Exam  Gen: Alert, well appearing.  Patient is oriented to person, place, time, and situation. AFFECT: pleasant, lucid thought and speech. Cardiovascular: Irregularly irregular, rate about 150 intermittently.  Brief pauses intermittently.  No murmur. Chest is clear, no wheezing or rales. Normal symmetric air entry throughout both lung fields. No chest wall deformities or  tenderness.   LABS:  Last CBC Lab Results  Component Value Date   WBC 6.9 06/22/2021   HGB 13.9 06/22/2021   HCT 40.9 06/22/2021   MCV 91.0 06/22/2021   RDW 14.3 06/22/2021   PLT 189.0 95/18/8416   Last metabolic panel Lab Results  Component Value Date   GLUCOSE 98 06/22/2021   NA 136 06/22/2021   K 3.7 06/22/2021   CL 101 06/22/2021   CO2 26 06/22/2021   BUN 29 (H) 06/22/2021   CREATININE 0.90 06/22/2021   CALCIUM 9.9 06/22/2021   PROT 6.8 06/22/2021   ALBUMIN 4.5 06/22/2021   BILITOT 1.0 06/22/2021   ALKPHOS 63 06/22/2021   AST 26 06/22/2021   ALT 25 06/22/2021   Last lipids Lab Results  Component Value Date   CHOL 100 06/22/2021   HDL 44.50 06/22/2021   LDLCALC 38 06/22/2021   TRIG 89.0 06/22/2021   CHOLHDL 2 06/22/2021   Last hemoglobin A1c Lab Results  Component Value Date   HGBA1C 6.2 (A) 12/22/2021   HGBA1C 6.2 12/22/2021   HGBA1C 6.2 12/22/2021   HGBA1C 6.2 12/22/2021   Last thyroid functions Lab Results  Component Value Date   TSH 3.12 07/31/2017   Lab Results  Component Value Date   PSA 0.68 06/22/2021   PSA 0.89 05/31/2020   PSA 1.36 10/14/2018   12-lead EKG today: Atrial flutter -fibrillation, rate 75-80, no runs of ventricular tachycardia picked up on the EKG.  No ischemic changes.  Compared to EKG 10/25/2011 A-fib/flutter is new.  IMPRESSION AND PLAN:  #1 new diagnosis atrial flutter-fibrillation. Intermittent tachycardic response but patient asymptomatic today. Check basic metabolic panel, magnesium, and TSH. Echocardiogram ordered, A-fib clinic referral ordered. Start Eliquis 10 mg twice daily x7 days and then 5 mg twice daily. Start Lopressor 25 mg twice daily.  He will call this afternoon to report his heart rate and how he feels. Stop aspirin.  Avoid NSAIDs.  Avoid caffeine. Follow-up with me in 4 to 5 days.  2.  Diabetes, well controlled on metformin 1000 mg a day. Point-of-care A1c today is 6.2%.  3.  Hypertension, well  controlled on HCTZ 25 mg a day. Electrolytes today.  4.  Hyperlipidemia, doing well on atorvastatin 40 mg a day. Lipid panel today.  An After Visit Summary was printed and given to the patient.  Spent 45 min with pt today reviewing HPI, reviewing relevant past history, doing exam, interpreting and discussing EKG findings, discussing medications to start today, discussing future testing and referrals, reassessing patient, and formulating plans.  FOLLOW UP: Return for 4-5 d f/u a-fib.  Signed:  Crissie Sickles, MD           12/22/2021

## 2021-12-26 ENCOUNTER — Ambulatory Visit (INDEPENDENT_AMBULATORY_CARE_PROVIDER_SITE_OTHER): Payer: Medicare PPO | Admitting: Family Medicine

## 2021-12-26 ENCOUNTER — Encounter: Payer: Self-pay | Admitting: Family Medicine

## 2021-12-26 VITALS — BP 123/66 | HR 52 | Temp 97.7°F | Ht 66.0 in | Wt 198.0 lb

## 2021-12-26 DIAGNOSIS — I480 Paroxysmal atrial fibrillation: Secondary | ICD-10-CM

## 2021-12-26 MED ORDER — DICLOFENAC SODIUM 1 % EX GEL: 4.0000 g | Freq: Four times a day (QID) | CUTANEOUS | 3 refills | Status: DC

## 2021-12-26 NOTE — Progress Notes (Signed)
OFFICE VISIT  12/26/2021  CC:  Chief Complaint  Patient presents with   Follow-up    A-fib   Patient is a 71 y.o. male who presents for 4-day follow-up new diagnosis atrial fibrillation/flutter. A/P as of last visit: "#1 new diagnosis atrial flutter-fibrillation. Intermittent tachycardic response but patient asymptomatic today. Check basic metabolic panel, magnesium, and TSH. Echocardiogram ordered, A-fib clinic referral ordered. Start Eliquis 10 mg twice daily x7 days and then 5 mg twice daily. Start Lopressor 25 mg twice daily.  He will call this afternoon to report his heart rate and how he feels. Stop aspirin.  Avoid NSAIDs.  Avoid caffeine."  INTERIM HX: Labs last visit all normal. He is feeling well. He is taking metoprolol 25 mg twice daily and Eliquis as prescribed. Home blood pressures 120s to 150s over 60s to 90s.  Heart rate has been 40s to 60s.  Status of cardiology referral: Patient has appointment with Dr. Marlou Porch tomorrow.  Status of echocardiogram: He has not been contacted.   Past Medical History:  Diagnosis Date   CAD (coronary artery disease)    CT demonstrated nonobstructive plaques (this test was prompted by borderline ETT.   Colitis 07/2020   Went to ER in [place], [place], resolved with Augmentin   Diabetes mellitus without complication ([place]) dx'd 02/7508   Fasting gluc 136 and HbA1c 6.7% at diagnosis.    Diverticulosis    GERD (gastroesophageal reflux disease)    occ   Hepatic steatosis 07/2020   noted on CT abd/pelv done for GE illness   History of colitis 08/06/2020   presumed infectious. CT abd/pelv w/[facility]->long segment circumferential wall thickening of descending colon   History of nonmelanoma skin cancer    BCC   Hx of adenomatous colonic polyps    Hyperlipidemia    Hypertension    w/hypertensive retinopathy   Internal hemorrhoids    Obesity, Class II, BMI 35-39.9    Osteoarthritis of both knees    mod to severe 2022,  steroid injections by ortho helped some.  Visco injections on the right --no significant help.    Past Surgical History:  Procedure Laterality Date   CARDIOVASCULAR STRESS TEST  02/2013   LexiScan/low-level exercise-Myoview (02/2013): No ischemia, EF 53%, occ symptomatic PVCs.   COLONOSCOPY  02/2021   2023 normal.  Recall 5 yrs   COLONOSCOPY W/ POLYPECTOMY  2005;08/21/10;11/29/15   adenomatous; also mild diverticulosis; recall 2017.  iFOB neg 09/2011.  11/2015: tubular adenoma x 1. 02/2021 normal->recall 46yr   HAMMER TOE SURGERY Right 05/24/2020   remvoal sebaceous cyst  15-20 years ago   TONSILLECTOMY      Outpatient Medications Prior to Visit  Medication Sig Dispense Refill   APIXABAN (ELIQUIS) VTE STARTER PACK ('10MG'$  AND '5MG'$ ) Take as directed on package: start with two-'5mg'$  tablets twice daily for 7 days. On day 8, switch to one-'5mg'$  tablet twice daily. 1 each 0   atorvastatin (LIPITOR) 40 MG tablet Take 1 tablet (40 mg total) by mouth daily. 90 tablet 3   glucose blood (FREESTYLE LITE) test strip USE TO CHECK BLOOD SUGAR 1-2 TIMES DAILY AS INSTRUCTED. 50 each 3   hydrochlorothiazide (HYDRODIURIL) 25 MG tablet Take 1 tablet (25 mg total) by mouth daily. 90 tablet 3   metFORMIN (GLUCOPHAGE XR) 500 MG 24 hr tablet 2 tabs po daily with supper 180 tablet 3   metoprolol tartrate (LOPRESSOR) 25 MG tablet Take 1 tablet (25 mg total) by mouth 2 (two) times daily.  60 tablet 1   potassium chloride SA (KLOR-CON M20) 20 MEQ tablet Take 2 tablets (40 mEq total) by mouth 2 (two) times daily. 360 tablet 3   No facility-administered medications prior to visit.    No Known Allergies  ROS As per HPI  PE:    12/26/2021    9:55 AM 12/22/2021    8:20 AM 06/22/2021    9:41 AM  Vitals with BMI  Height '5\' 6"'$  '5\' 6"'$  '5\' 6"'$   Weight 198 lbs 195 lbs 3 oz 197 lbs 3 oz  BMI 31.97 41.96 22.29  Systolic 798 921 194  Diastolic 66 76 64  Pulse 52 76 59     Physical Exam  Gen: Alert, well appearing.   Patient is oriented to person, place, time, and situation. CV: Regular rhythm, brady (45), no murmur EXT: no clubbing or cyanosis.  no edema.    LABS:  Last CBC Lab Results  Component Value Date   WBC 6.9 06/22/2021   HGB 13.9 06/22/2021   HCT 40.9 06/22/2021   MCV 91.0 06/22/2021   RDW 14.3 06/22/2021   PLT 189.0 17/40/8144   Last metabolic panel Lab Results  Component Value Date   GLUCOSE 104 (H) 12/22/2021   NA 137 12/22/2021   K 3.5 12/22/2021   CL 100 12/22/2021   CO2 29 12/22/2021   BUN 30 (H) 12/22/2021   CREATININE 1.09 12/22/2021   CALCIUM 9.7 12/22/2021   PROT 6.6 12/22/2021   ALBUMIN 4.4 12/22/2021   BILITOT 1.0 12/22/2021   ALKPHOS 61 12/22/2021   AST 22 12/22/2021   ALT 22 12/22/2021   Last lipids Lab Results  Component Value Date   CHOL 88 12/22/2021   HDL 37.40 (L) 12/22/2021   LDLCALC 33 12/22/2021   TRIG 85.0 12/22/2021   CHOLHDL 2 12/22/2021   Last hemoglobin A1c Lab Results  Component Value Date   HGBA1C 6.2 (A) 12/22/2021   HGBA1C 6.2 12/22/2021   HGBA1C 6.2 12/22/2021   HGBA1C 6.2 12/22/2021   Last thyroid functions Lab Results  Component Value Date   TSH 3.35 12/22/2021   IMPRESSION AND PLAN:  #1 new diagnosis A-fib/flutter. Asymptomatic.  Doing well on recent start of Eliquis and Lopressor. Normal rhythm today. His cardiology consult is tomorrow. We will check on status of echocardiogram order. His labs were normal. No changes today.  #2 right knee osteoarthritis. Okay for Voltaren gel.  He sees his orthopedist tomorrow and they will discuss possible joint steroid injection.  An After Visit Summary was printed and given to the patient.  FOLLOW UP: Return in about 6 months (around 06/26/2022) for annual CPE (fasting).  Signed:  Crissie Sickles, MD           12/26/2021

## 2021-12-27 ENCOUNTER — Ambulatory Visit: Payer: Medicare PPO | Attending: Interventional Cardiology | Admitting: Cardiology

## 2021-12-27 ENCOUNTER — Encounter: Payer: Self-pay | Admitting: Cardiology

## 2021-12-27 VITALS — BP 120/70 | HR 55 | Ht 66.0 in | Wt 201.0 lb

## 2021-12-27 DIAGNOSIS — I480 Paroxysmal atrial fibrillation: Secondary | ICD-10-CM | POA: Diagnosis not present

## 2021-12-27 DIAGNOSIS — I2510 Atherosclerotic heart disease of native coronary artery without angina pectoris: Secondary | ICD-10-CM | POA: Diagnosis not present

## 2021-12-27 DIAGNOSIS — M170 Bilateral primary osteoarthritis of knee: Secondary | ICD-10-CM | POA: Diagnosis not present

## 2021-12-27 MED ORDER — METOPROLOL TARTRATE 25 MG PO TABS: 25.0000 mg | ORAL_TABLET | ORAL | 1 refills | Status: AC | PRN

## 2021-12-27 MED ORDER — APIXABAN 5 MG PO TABS: 5.0000 mg | ORAL_TABLET | Freq: Two times a day (BID) | ORAL | 6 refills | Status: DC

## 2021-12-27 NOTE — Progress Notes (Signed)
Cardiology Office Note:    Date:  12/27/2021   ID:  Michael Hodge, DOB 1950-09-02, MRN [id number]  PCP:  Tammi Sou, MD   [place] Providers Cardiologist:  Candee Furbish, MD     Referring MD: Tammi Sou, MD     History of Present Illness:    Michael Hodge is a 71 y.o. male with a hx of CAD, HTN, HLD, and DM presenting to the clinic today for evaluation of new onset A-fib.  He was seen by his PCP Dr. Anitra Lauth on 12/22/21 and had an EKG done due to complaints of 2 episodes of palpitations with an accompanying indigestion sensation in his upper chest. He was found to be in A-flutter/fibrillation at that visit. Although patient was asymptomatic with rates between 75-80bpm. He was started on Eliquis '5mg'$  BID and Lopressor '25mg'$  BID at that time.  Today, he states that he only felt these episodes of palpitations for 5-10 minutes or less. He has not had any recurrence of these symptomatic episodes of A-fib.  He denies any family history of A-fib. He states that he rarely snores and denies any witnessed apneic episodes.  He does not drink coffee but he does drink diet cokes a few times a month. He denies any other caffeine intake.  He denies any thyroid problems.  He socially drinks alcohol but denies any alcohol use for several days prior to the onset of his A-fib.  Patient does have HTN but this has been well controlled under 120/80 for the most part.  He denies any bleeding issues with the Eliquis.  The patient denies chest pain, chest pressure, dyspnea at rest or with exertion, PND, orthopnea, or leg swelling. Denies cough, fever, chills, nausea, or vomiting. Denies syncope, presyncope, or snoring. Denies dizziness or lightheadedness.   He states that he would like to have a TKA within the next year. He was seen by his orthopedist recently who stopped his PO Diclofenac due to the bleeding risks alongside his Eliquis. He is now trying to use topical Voltaren   instead for his knee pain.  He would like to try a Medrol dosepak if his pain flares up during an upcoming trip. He states that his orthopedist would like cardiology clearance for this.   Past Medical History:  Diagnosis Date   Atrial fibrillation ([place])    11/2021   CAD (coronary artery disease)    CT demonstrated nonobstructive plaques (this test was prompted by borderline ETT.   Colitis 07/2020   Went to ER in [place], [place], resolved with Augmentin   Diabetes mellitus without complication ([place]) dx'd 05/3152   Fasting gluc 136 and HbA1c 6.7% at diagnosis.    Diverticulosis    GERD (gastroesophageal reflux disease)    occ   Hepatic steatosis 07/2020   noted on CT abd/pelv done for GE illness   History of colitis 08/06/2020   presumed infectious. CT abd/pelv w/contrast at [facility] med [facility]->long segment circumferential wall thickening of descending colon   History of nonmelanoma skin cancer    BCC   Hx of adenomatous colonic polyps    Hyperlipidemia    Hypertension    w/hypertensive retinopathy   Internal hemorrhoids    Obesity, Class II, BMI 35-39.9    Osteoarthritis of both knees    mod to severe 2022, steroid injections by ortho helped some.  Visco injections on the right --no significant help.    Past Surgical History:  Procedure Laterality Date  CARDIOVASCULAR STRESS TEST  02/2013   LexiScan/low-level exercise-Myoview (02/2013): No ischemia, EF 53%, occ symptomatic PVCs.   COLONOSCOPY  02/2021   2023 normal.  Recall 5 yrs   COLONOSCOPY W/ POLYPECTOMY  2005;08/21/10;11/29/15   adenomatous; also mild diverticulosis; recall 2017.  iFOB neg 09/2011.  11/2015: tubular adenoma x 1. 02/2021 normal->recall 69yr   HAMMER TOE SURGERY Right 05/24/2020   remvoal sebaceous cyst  15-20 years ago   TONSILLECTOMY      Current Medications: Current Meds  Medication Sig   apixaban (ELIQUIS) 5 MG TABS tablet Take 1 tablet (5 mg total) by mouth 2 (two) times daily.   atorvastatin (LIPITOR) 40  MG tablet Take 1 tablet (40 mg total) by mouth daily.   diclofenac Sodium (VOLTAREN) 1 % GEL Apply 4 g topically 4 (four) times daily.   glucose blood (FREESTYLE LITE) test strip USE TO CHECK BLOOD SUGAR 1-2 TIMES DAILY AS INSTRUCTED.   hydrochlorothiazide (HYDRODIURIL) 25 MG tablet Take 1 tablet (25 mg total) by mouth daily.   metFORMIN (GLUCOPHAGE XR) 500 MG 24 hr tablet 2 tabs po daily with supper   potassium chloride SA (KLOR-CON M20) 20 MEQ tablet Take 2 tablets (40 mEq total) by mouth 2 (two) times daily.   [DISCONTINUED] APIXABAN (ELIQUIS) VTE STARTER PACK ('10MG'$  AND '5MG'$ ) Take as directed on package: start with two-'5mg'$  tablets twice daily for 7 days. On day 8, switch to one-'5mg'$  tablet twice daily.   [DISCONTINUED] metoprolol tartrate (LOPRESSOR) 25 MG tablet Take 1 tablet (25 mg total) by mouth 2 (two) times daily.     Allergies:   Patient has no known allergies.   Social History   Socioeconomic History   Marital status: Married    Spouse name: Not on file   Number of children: Not on file   Years of education: Not on file   Highest education level: Bachelor's degree (e.g., BA, AB, BS)  Occupational History   Not on file  Tobacco Use   Smoking status: Never   Smokeless tobacco: Never  Vaping Use   Vaping Use: Never used  Substance and Sexual Activity   Alcohol use: Yes    Alcohol/week: 3.0 standard drinks of alcohol    Types: 3 Shots of liquor per week    Comment: occ   Drug use: No   Sexual activity: Yes  Other Topics Concern   Not on file  Social History Narrative   Married x 40 yrs.   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   Social Determinants of Health   Financial Resource Strain: Low Risk  (06/18/2021)   Overall Financial Resource Strain (CARDIA)    Difficulty of Paying Living Expenses: Not hard at all  Food Insecurity: No Food Insecurity (06/18/2021)   Hunger Vital Sign    Worried About Running Out of Food in the Last  Year: Never true    Ran Out of Food in the Last Year: Never true  Transportation Needs: No Transportation Needs (06/18/2021)   PRAPARE - THydrologist(Medical): No    Lack of Transportation (Non-Medical): No  Physical Activity: Sufficiently Active (06/18/2021)   Exercise Vital Sign    Days of Exercise per Week: 4 days    Minutes of Exercise per Session: 50 min  Recent Concern: Physical Activity - Insufficiently Active (04/26/2021)   Exercise Vital Sign    Days of Exercise per Week: 2 days  Minutes of Exercise per Session: 60 min  Stress: No Stress Concern Present (06/18/2021)   [place]    Feeling of Stress : Only a little  Social Connections: Socially Integrated (06/18/2021)   Social Connection and Isolation Panel [NHANES]    Frequency of Communication with Friends and Family: More than three times a week    Frequency of Social Gatherings with Friends and Family: More than three times a week    Attends Religious Services: More than 4 times per year    Active Member of [employer] or Organizations: Yes    Attends Music therapist: More than 4 times per year    Marital Status: Married     Family History: The patient's family history includes Diabetes in his father; Heart attack in his mother; Stroke in his father and another family member. There is no history of Colon cancer, Colon polyps, Rectal cancer, Stomach cancer, or Esophageal cancer.  ROS:   Please see the history of present illness.    All other systems reviewed and are negative.  EKGs/Labs/Other Studies Reviewed:    The following studies were reviewed today:  CT calcium score 08/17/2010: Findings:  Calcium score  44.5 with discrete foci in the ostial RCA and  proximal LAD at the take off of D1  Coronary CTA:  Right dominant with no anomaly.  LM- less than 20%  calcific disease, LAD- less than 30% mixed plaque in the  proximal  segment, normal mid and distal.  D1-large vessel with less than 30%  calcific stenosis ostially., D2-normal, D3-small and normal.  Cirucmflex-normal, OM1, OM2- normal, RCA - dominant and normal.  Noncardiac: Soft tissue and lung windows reviewed with no  significant findings.  See separate report from [facility]  Radiology  Impression:  1)    Calcium score 44.5  2)    Right dominant coronary arteries without significant  stenosis.  See narrative above.  ASA and LDL goal under 100 given  mixed plaque in proximal LAD and calcium score    EKG:  EKG was ordered today. EKG done today was personally reviewed and demonstrates sinus bradycardia 49 12/22/21: A-fib rate of 90bpm.  Recent Labs: 06/22/2021: Hemoglobin 13.9; Platelets 189.0 12/22/2021: ALT 22; BUN 30; Creatinine, Ser 1.09; Magnesium 1.6; Potassium 3.5; Sodium 137; TSH 3.35  Recent Lipid Panel    Component Value Date/Time   CHOL 88 12/22/2021 0931   TRIG 85.0 12/22/2021 0931   HDL 37.40 (L) 12/22/2021 0931   CHOLHDL 2 12/22/2021 0931   VLDL 17.0 12/22/2021 0931   LDLCALC 33 12/22/2021 0931     Risk Assessment/Calculations:     CHA2DS2-VASc Score =     This indicates a  % annual risk of stroke. The patient's score is based upon:                 Physical Exam:    VS:  BP 120/70 (BP Location: Left Arm, Patient Position: Sitting, Cuff Size: Normal)   Pulse (!) 55   Ht '5\' 6"'$  (1.676 m)   Wt 201 lb (91.2 kg)   SpO2 95%   BMI 32.44 kg/m     Wt Readings from Last 3 Encounters:  12/27/21 201 lb (91.2 kg)  12/26/21 198 lb (89.8 kg)  12/22/21 195 lb 3.2 oz (88.5 kg)    GEN:  Well nourished, well developed in no acute distress HEENT: Normal NECK: No JVD; No carotid bruits LYMPHATICS: No lymphadenopathy CARDIAC: RRR,  no murmurs, rubs, gallops RESPIRATORY:  Clear to auscultation without rales, wheezing or rhonchi  ABDOMEN: Soft, non-tender, non-distended MUSCULOSKELETAL:  No edema; No deformity  SKIN:  Warm and dry NEUROLOGIC:  Alert and oriented x 3 PSYCHIATRIC:  Normal affect   ASSESSMENT:    1. Paroxysmal atrial fibrillation (HCC)   2. Coronary artery disease involving native coronary artery of native heart without angina pectoris    PLAN:    In order of problems listed above:  Paroxysmal atrial fibrillation - TSH is normal electrolytes were normal, modest alcohol.  No significant snoring.  Discussed with him that ages are biggest risk factor.  Weight as well. - Agree with start of Eliquis 5 mg twice a day. - He has metoprolol 25 mg twice a day as well.  Comfortable with him continuing that for now. - We discussed potential options down the road such as ablation.  He asked about cardioversion.  If he is in normal rhythm he does not need cardioversion.  Went over the physiology behind atrial fibrillation and the potential for stroke.  He seemed symptomatic at 1 point with his atrial fibrillation.  He has upcoming trip to [place], bird hunting in [place] Knee pain.  May need upcoming knee surgery.  This should be fine.  He would not need a Lovenox bridge.  Of course we may see perioperative atrial fibrillation.  I am fine with him taking a short-term Medrol Dosepak if necessary.  Try to avoid excessive caffeine.  Coronary artery disease - Previously diagnosed with nonobstructive calcified plaque.  Currently on atorvastatin 40 mg.  Excellent.  His LDL is 33 wonderful.  At goal.       Follow up: 4 months with Dr. Marlou Porch or APP.  Medication Adjustments/Labs and Tests Ordered: Current medicines are reviewed at length with the patient today.  Concerns regarding medicines are outlined above.  Orders Placed This Encounter  Procedures   EKG 12-Lead   ECHOCARDIOGRAM COMPLETE   Meds ordered this encounter  Medications   apixaban (ELIQUIS) 5 MG TABS tablet    Sig: Take 1 tablet (5 mg total) by mouth 2 (two) times daily.    Dispense:  60 tablet    Refill:  6    metoprolol tartrate (LOPRESSOR) 25 MG tablet    Sig: Take 1 tablet (25 mg total) by mouth as needed (racing heart beat). Take as needed for racing heart beat    Dispense:  60 tablet    Refill:  1    Patient Instructions  Medication Instructions:  Please take Metoprolol 25 mg as needed for racing heart beat. Take Eliquis 5 mg twice daily. Continue all other medications as listed.   *If you need a refill on your cardiac medications before your next appointment, please call your pharmacy*  Testing/Procedures: Your physician has requested that you have an echocardiogram. Echocardiography is a painless test that uses sound waves to create images of your heart. It provides your doctor with information about the size and shape of your heart and how well your heart's chambers and valves are working. This procedure takes approximately one hour. There are no restrictions for this procedure. Please do NOT wear cologne, perfume, aftershave, or lotions (deodorant is allowed). Please arrive 15 minutes prior to your appointment time.  Follow-Up: At [facility], you and your health needs are our priority.  As part of our continuing mission to provide you with exceptional heart care, we have created designated Provider Care Teams.  These Care Teams include your primary Cardiologist (physician) and Advanced Practice Providers (APPs -  Physician Assistants and Nurse Practitioners) who all work together to provide you with the care you need, when you need it.  We recommend signing up for the patient portal called "MyChart".  Sign up information is provided on this After Visit Summary.  MyChart is used to connect with patients for Virtual Visits (Telemedicine).  Patients are able to view lab/test results, encounter notes, upcoming appointments, etc.  Non-urgent messages can be sent to your provider as well.   To learn more about what you can do with MyChart, go to [email or web address].    Your next  appointment:   4 month(s)  The format for your next appointment:   In Person  Provider:   Dr Candee Furbish      Important Information About Sugar          I,Alexis Herring,acting as a scribe for Candee Furbish, MD.,have documented all relevant documentation on the behalf of Candee Furbish, MD,as directed by  Candee Furbish, MD while in the presence of Candee Furbish, MD.  I, Candee Furbish, MD, have reviewed all documentation for this visit. The documentation on 12/27/21 for the exam, diagnosis, procedures, and orders are all accurate and complete.   Signed, Candee Furbish, MD  12/27/2021 5:06 PM    [place]

## 2021-12-27 NOTE — Patient Instructions (Addendum)
Medication Instructions:  Please take Metoprolol 25 mg as needed for racing heart beat. Take Eliquis 5 mg twice daily. Continue all other medications as listed.   *If you need a refill on your cardiac medications before your next appointment, please call your pharmacy*  Testing/Procedures: Your physician has requested that you have an echocardiogram. Echocardiography is a painless test that uses sound waves to create images of your heart. It provides your doctor with information about the size and shape of your heart and how well your heart's chambers and valves are working. This procedure takes approximately one hour. There are no restrictions for this procedure. Please do NOT wear cologne, perfume, aftershave, or lotions (deodorant is allowed). Please arrive 15 minutes prior to your appointment time.  Follow-Up: At [facility], you and your health needs are our priority.  As part of our continuing mission to provide you with exceptional heart care, we have created designated Provider Care Teams.  These Care Teams include your primary Cardiologist (physician) and Advanced Practice Providers (APPs -  Physician Assistants and Nurse Practitioners) who all work together to provide you with the care you need, when you need it.  We recommend signing up for the patient portal called "MyChart".  Sign up information is provided on this After Visit Summary.  MyChart is used to connect with patients for Virtual Visits (Telemedicine).  Patients are able to view lab/test results, encounter notes, upcoming appointments, etc.  Non-urgent messages can be sent to your provider as well.   To learn more about what you can do with MyChart, go to [email or web address].    Your next appointment:   4 month(s)  The format for your next appointment:   In Person  Provider:   Dr Candee Furbish      Important Information About Sugar

## 2022-01-05 ENCOUNTER — Ambulatory Visit: Payer: Medicare PPO | Admitting: Interventional Cardiology

## 2022-01-17 ENCOUNTER — Encounter: Payer: Self-pay | Admitting: Family Medicine

## 2022-01-17 ENCOUNTER — Ambulatory Visit (HOSPITAL_COMMUNITY): Payer: Medicare PPO | Attending: Internal Medicine

## 2022-01-17 DIAGNOSIS — I480 Paroxysmal atrial fibrillation: Secondary | ICD-10-CM | POA: Diagnosis not present

## 2022-01-17 LAB — ECHOCARDIOGRAM COMPLETE
Area-P 1/2: 3.14 cm2
S' Lateral: 4.2 cm

## 2022-01-23 ENCOUNTER — Encounter: Payer: Self-pay | Admitting: Cardiology

## 2022-01-24 ENCOUNTER — Telehealth: Payer: Self-pay | Admitting: *Deleted

## 2022-01-24 NOTE — Telephone Encounter (Signed)
  [name], MD 01/17/2022 12:47 PM EST     Reassuring echocardiogram, normal pump function. Mildly dilated left atrium.  Atrial fibrillation noted. Candee Furbish, MD    Reviewed results with pt who states understanding.  Aware results are "reassuring" and no new orders were given based on these results.  Pt had no further questions at the time of the call.

## 2022-01-25 ENCOUNTER — Encounter: Payer: Self-pay | Admitting: Physician Assistant

## 2022-01-25 ENCOUNTER — Telehealth: Payer: Medicare PPO | Admitting: Physician Assistant

## 2022-01-25 DIAGNOSIS — J019 Acute sinusitis, unspecified: Secondary | ICD-10-CM

## 2022-01-25 NOTE — Progress Notes (Signed)
Patient currently in [place]. Michael Hodge be seen via [place] since we are not licensed there. Information sent so he can be seen via video tonight.  No charge for visit.

## 2022-01-30 ENCOUNTER — Encounter: Payer: Self-pay | Admitting: Family Medicine

## 2022-01-31 ENCOUNTER — Encounter: Payer: Self-pay | Admitting: Family Medicine

## 2022-01-31 ENCOUNTER — Ambulatory Visit (INDEPENDENT_AMBULATORY_CARE_PROVIDER_SITE_OTHER): Payer: Medicare PPO | Admitting: Family Medicine

## 2022-01-31 ENCOUNTER — Ambulatory Visit: Payer: Medicare PPO | Admitting: Family Medicine

## 2022-01-31 VITALS — BP 120/68 | HR 80 | Temp 98.7°F | Ht 66.0 in | Wt 201.2 lb

## 2022-01-31 DIAGNOSIS — J101 Influenza due to other identified influenza virus with other respiratory manifestations: Secondary | ICD-10-CM

## 2022-01-31 MED ORDER — HYDROCODONE BIT-HOMATROP MBR 5-1.5 MG/5ML PO SOLN: 5.0000 mL | Freq: Three times a day (TID) | ORAL | 0 refills | Status: DC | PRN

## 2022-01-31 NOTE — Patient Instructions (Signed)

## 2022-01-31 NOTE — Progress Notes (Signed)
[facility] [address] [place] [address] Dept: [phone] Dept Fax: [phone]  Office Visit  Subjective:    Patient ID: Michael Hodge, male    DOB: 09/04/1950, 71 y.o..   MRN: [id number]  Chief Complaint  Patient presents with   Acute Visit    C/o having chest congestion, cough, fever x x 3 days.    Was given Augmentin 5 days ago from Tele-doc.   Homc covid test negative.      History of Present Illness:  Patient is in today complaining of a 1 week history of an upper respiratory infection. Mr. Pott notes he started with nasal congestion and sinus pressure. after several days, he had a video visit with Mr. Hassell Done and was prescribed a course of Augmentin. This was the day before he left on a hunting trip to [place]. He started to feel better over the next couple of days. However, three days ago, he started to feel worse again, with chest congestion, cough, and subjective fever. He did a home COVID test which was neg. He has also been taking Claritin, Mucinex, Flonase, and Tylenol.  Past Medical History: Patient Active Problem List   Diagnosis Date Noted   Welcome to Medicare preventive visit 04/14/2015   Diabetes mellitus without complication ([place]) 00/86/7619   Sciatica 07/04/2013   Hypokalemia 07/04/2013   Type II or unspecified type diabetes mellitus without mention of complication, not stated as uncontrolled 10/11/2011   Health maintenance examination 09/17/2011   CAD (coronary artery disease) 09/08/2010   HTN (hypertension) 09/08/2010   Dyslipidemia 09/08/2010   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   ARTHRITIS 01/20/2008   ALLERGIC RHINITIS 10/30/2006   Past Surgical History:  Procedure Laterality Date   CARDIOVASCULAR STRESS TEST  02/2013   LexiScan/low-level exercise-Myoview (02/2013): No ischemia, EF 53%, occ symptomatic PVCs.   COLONOSCOPY  02/2021   2023 normal.  Recall 5 yrs   COLONOSCOPY W/  POLYPECTOMY  2005;08/21/10;11/29/15   adenomatous; also mild diverticulosis; recall 2017.  iFOB neg 09/2011.  11/2015: tubular adenoma x 1. 02/2021 normal->recall 35yr   HAMMER TOE SURGERY Right 05/24/2020   remvoal sebaceous cyst  15-20 years ago   TONSILLECTOMY     TRANSTHORACIC ECHOCARDIOGRAM     12/2021 EF 55-60%, grd I DD, mild LAE   Family History  Problem Relation Age of Onset   Heart attack Mother    Diabetes Father    Stroke Father    Stroke Other    Colon cancer Neg Hx    Colon polyps Neg Hx    Rectal cancer Neg Hx    Stomach cancer Neg Hx    Esophageal cancer Neg Hx    Outpatient Medications Prior to Visit  Medication Sig Dispense Refill   amoxicillin-clavulanate (AUGMENTIN XR) 1000-62.5 MG 12 hr tablet 1 tablet oral 7 days  every 12 hours     apixaban (ELIQUIS) 5 MG TABS tablet Take 1 tablet (5 mg total) by mouth 2 (two) times daily. 60 tablet 6   atorvastatin (LIPITOR) 40 MG tablet Take 1 tablet (40 mg total) by mouth daily. 90 tablet 3   diclofenac Sodium (VOLTAREN) 1 % GEL Apply 4 g topically 4 (four) times daily. 100 g 3   glucose blood (FREESTYLE LITE) test strip USE TO CHECK BLOOD SUGAR 1-2 TIMES DAILY AS INSTRUCTED. 50 each 3   hydrochlorothiazide (HYDRODIURIL) 25 MG tablet Take 1 tablet (25 mg total) by mouth daily. 90 tablet 3  metFORMIN (GLUCOPHAGE XR) 500 MG 24 hr tablet 2 tabs po daily with supper 180 tablet 3   metoprolol tartrate (LOPRESSOR) 25 MG tablet Take 1 tablet (25 mg total) by mouth as needed (racing heart beat). Take as needed for racing heart beat 60 tablet 1   potassium chloride SA (KLOR-CON M20) 20 MEQ tablet Take 2 tablets (40 mEq total) by mouth 2 (two) times daily. 360 tablet 3   No facility-administered medications prior to visit.   No Known Allergies    Objective:   Today's Vitals   01/31/22 1345  BP: 120/68  Pulse: 80  Temp: 98.7 F (37.1 C)  TempSrc: Temporal  SpO2: 97%  Weight: 201 lb 3.2 oz (91.3 kg)  Height: '5\' 6"'$  (1.676 m)    Body mass index is 32.47 kg/m.   General: Well developed, well nourished. No acute distress. HEENT: Normocephalic, non-traumatic. Eyes clear. External ears normal. EAC and TMs   normal bilaterally. Nose  with mild congestion. Mucous membranes moist. Oropharynx clear.   Good dentition. Neck: Supple. No lymphadenopathy. No thyromegaly. Lungs: Clear to auscultation bilaterally. No wheezing, rales or rhonchi. Moderate cough   noted. CV: RRR without murmurs or rubs. Pulses 2+ bilaterally. Psych: Alert and oriented. Normal mood and affect.  Health Maintenance Due  Topic Date Due   OPHTHALMOLOGY EXAM  01/17/2022   Lab Results POCT COVID: Neg. POCT Influenza A & B: Positive for A    Assessment & Plan:   1. Influenza A Discussed home care for viral illness, including rest, pushing fluids, and OTC medications as needed for symptom relief. Recommend hot tea with honey for sore throat symptoms. He is outside the window for treatment with Tamiflu. I will provide some cough medicine. Follow-up if needed for worsening or persistent symptoms.  - HYDROcodone bit-homatropine (HYCODAN) 5-1.5 MG/5ML syrup; Take 5 mLs by mouth every 8 (eight) hours as needed for cough.  Dispense: 120 mL; Refill: 0   Return if symptoms worsen or fail to improve.   Haydee Salter, MD

## 2022-02-01 ENCOUNTER — Ambulatory Visit: Payer: Medicare PPO | Admitting: Family Medicine

## 2022-02-06 ENCOUNTER — Telehealth: Payer: Medicare PPO | Admitting: Family Medicine

## 2022-02-06 ENCOUNTER — Encounter: Payer: Self-pay | Admitting: Family Medicine

## 2022-02-06 ENCOUNTER — Ambulatory Visit (INDEPENDENT_AMBULATORY_CARE_PROVIDER_SITE_OTHER): Payer: Medicare PPO | Admitting: Family Medicine

## 2022-02-06 VITALS — BP 138/82 | HR 79 | Temp 97.0°F | Wt 195.0 lb

## 2022-02-06 DIAGNOSIS — H6993 Unspecified Eustachian tube disorder, bilateral: Secondary | ICD-10-CM

## 2022-02-06 DIAGNOSIS — G9331 Postviral fatigue syndrome: Secondary | ICD-10-CM

## 2022-02-06 MED ORDER — PREDNISONE 10 MG (21) PO TBPK: ORAL_TABLET | ORAL | 0 refills | Status: DC

## 2022-02-06 MED ORDER — BENZONATATE 100 MG PO CAPS: 100.0000 mg | ORAL_CAPSULE | Freq: Three times a day (TID) | ORAL | 0 refills | Status: DC | PRN

## 2022-02-06 NOTE — Patient Instructions (Addendum)
Symptoms are likely related to post flu.  For ears, we are prescriping prednisone.  For cough, can use tessalon perles.   Please be sure to drink plenty of fluids.   You may take the following OTC medications to help with symptoms:   For headache, sore throat, fevers, muscle aches, chills, other pain, take tylenol  For congestion, use nasal spraysor antihistamines  Please follow up if no improvement.   Go to ED if you have severe chest pain, fevers, shortness of breath or other worrisome symptoms.

## 2022-02-06 NOTE — Progress Notes (Signed)
Assessment/Plan:   Problem List Items Addressed This Visit   None Visit Diagnoses     Post viral syndrome    -  Primary   Relevant Medications   predniSONE (STERAPRED UNI-PAK 21 TAB) 10 MG (21) TBPK tablet   benzonatate (TESSALON) 100 MG capsule   ETD (Eustachian tube dysfunction), bilateral       Relevant Medications   predniSONE (STERAPRED UNI-PAK 21 TAB) 10 MG (21) TBPK tablet   benzonatate (TESSALON) 100 MG capsule      Otalgia, fatigue, cough, and sinus congestion likely remnant of recent sinusitis, most likely from recent influenza.  Chest tightness, cannot rule out any cardiopulmonary disease, especially patient given history of A-fib  However, vital signs normal and physical exam normal, less likely to have respiratory or cardiac involvement at this time Prednisone for ETD/sinus congestion Tessalon Perles for ongoing cough, continue Hycodan as needed Counseled patient on postviral syndrome Provided reassurance that likely will improve soon Strict return and ED precautions discussed    Subjective:  HPI:  Michael Hodge is a 71 y.o. male who has ALLERGIC RHINITIS; ARTHRITIS; Exogenous obesity; CAD (coronary artery disease); HTN (hypertension); Dyslipidemia; Health maintenance examination; Type II or unspecified type diabetes mellitus without mention of complication, not stated as uncontrolled; Sciatica; Hypokalemia; Diabetes mellitus without complication ([place]); and Welcome to Medicare preventive visit on their problem list..   He  has a past medical history of Atrial fibrillation ([place]), CAD (coronary artery disease), Colitis (07/2020), Diabetes mellitus without complication ([place]) (dx'd 08/2011), Diverticulosis, GERD (gastroesophageal reflux disease), Hepatic steatosis (07/2020), History of colitis (08/06/2020), History of nonmelanoma skin cancer, adenomatous colonic polyps, Hyperlipidemia, Hypertension, Internal hemorrhoids, Obesity, Class II, BMI 35-39.9, and Osteoarthritis  of both knees.Marland Kitchen   He presents with chief complaint of Follow-up (Positive for Flu A on 01/31/22.  Sinus pressure and cough, right and left ear fullness ongoing. ) .   Postviral syndrome: Patient complains of follow up on follow up on sinusitis and recent influenza A case. Symptoms include bilateral ear pain, congestion, cough, and sinus pressure . Onset of symptoms was 2 weeks ago, patient was seen as an urgent care and prescribed a course of Augmentin, which she completed.  Patient reports that he initially had improvement in symptoms but then had recurrence.  Patient then came to clinic on 01/31/2022.  Patient saw Dr. Gena Fray.  At office patient tested positive for influenza A.  Patient was outside the window for Tamiflu.  Was prescribed Hycodan for cough and OTC remedy for supportive care.  Unchanged since that time.  Since last visit in office, patient reports ongoing cough, bilateral sinus congestion and left ear pressure.  He endorses some mild tightness of his chest with cough.  He also feels fatigued, but denies any chest pain, palpitations, wheezing, fevers, chills, hearing changes, abdominal pain, nausea, vomiting, diarrhea, or dyspnea.  Past Surgical History:  Procedure Laterality Date   CARDIOVASCULAR STRESS TEST  02/2013   LexiScan/low-level exercise-Myoview (02/2013): No ischemia, EF 53%, occ symptomatic PVCs.   COLONOSCOPY  02/2021   2023 normal.  Recall 5 yrs   COLONOSCOPY W/ POLYPECTOMY  2005;08/21/10;11/29/15   adenomatous; also mild diverticulosis; recall 2017.  iFOB neg 09/2011.  11/2015: tubular adenoma x 1. 02/2021 normal->recall 64yr   HAMMER TOE SURGERY Right 05/24/2020   remvoal sebaceous cyst  15-20 years ago   TONSILLECTOMY     TRANSTHORACIC ECHOCARDIOGRAM     12/2021 EF 55-60%, grd I DD, mild LAE    Outpatient Medications  Prior to Visit  Medication Sig Dispense Refill   apixaban (ELIQUIS) 5 MG TABS tablet Take 1 tablet (5 mg total) by mouth 2 (two) times daily. 60 tablet 6    atorvastatin (LIPITOR) 40 MG tablet Take 1 tablet (40 mg total) by mouth daily. 90 tablet 3   diclofenac Sodium (VOLTAREN) 1 % GEL Apply 4 g topically 4 (four) times daily. 100 g 3   glucose blood (FREESTYLE LITE) test strip USE TO CHECK BLOOD SUGAR 1-2 TIMES DAILY AS INSTRUCTED. 50 each 3   hydrochlorothiazide (HYDRODIURIL) 25 MG tablet Take 1 tablet (25 mg total) by mouth daily. 90 tablet 3   HYDROcodone bit-homatropine (HYCODAN) 5-1.5 MG/5ML syrup Take 5 mLs by mouth every 8 (eight) hours as needed for cough. 120 mL 0   metFORMIN (GLUCOPHAGE XR) 500 MG 24 hr tablet 2 tabs po daily with supper 180 tablet 3   metoprolol tartrate (LOPRESSOR) 25 MG tablet Take 1 tablet (25 mg total) by mouth as needed (racing heart beat). Take as needed for racing heart beat 60 tablet 1   potassium chloride SA (KLOR-CON M20) 20 MEQ tablet Take 2 tablets (40 mEq total) by mouth 2 (two) times daily. 360 tablet 3   amoxicillin-clavulanate (AUGMENTIN XR) 1000-62.5 MG 12 hr tablet 1 tablet oral 7 days  every 12 hours (Patient not taking: Reported on 02/06/2022)     No facility-administered medications prior to visit.    Family History  Problem Relation Age of Onset   Heart attack Mother    Diabetes Father    Stroke Father    Stroke Other    Colon cancer Neg Hx    Colon polyps Neg Hx    Rectal cancer Neg Hx    Stomach cancer Neg Hx    Esophageal cancer Neg Hx     Social History   Socioeconomic History   Marital status: Married    Spouse name: Not on file   Number of children: Not on file   Years of education: Not on file   Highest education level: Bachelor's degree (e.g., BA, AB, BS)  Occupational History   Not on file  Tobacco Use   Smoking status: Never   Smokeless tobacco: Never  Vaping Use   Vaping Use: Never used  Substance and Sexual Activity   Alcohol use: Yes    Alcohol/week: 3.0 standard drinks of alcohol    Types: 3 Shots of liquor per week    Comment: occ   Drug use: No   Sexual  activity: Yes  Other Topics Concern   Not on file  Social History Narrative   Married x 40 yrs.   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   No T/A/Ds.   Social Determinants of Health   Financial Resource Strain: Low Risk  (06/18/2021)   Overall Financial Resource Strain (CARDIA)    Difficulty of Paying Living Expenses: Not hard at all  Food Insecurity: No Food Insecurity (06/18/2021)   Hunger Vital Sign    Worried About Running Out of Food in the Last Year: Never true    Ran Out of Food in the Last Year: Never true  Transportation Needs: No Transportation Needs (06/18/2021)   PRAPARE - Hydrologist (Medical): No    Lack of Transportation (Non-Medical): No  Physical Activity: Sufficiently Active (06/18/2021)   Exercise Vital Sign    Days of Exercise per Week: 4 days    Minutes of Exercise  per Session: 50 min  Recent Concern: Physical Activity - Insufficiently Active (04/26/2021)   Exercise Vital Sign    Days of Exercise per Week: 2 days    Minutes of Exercise per Session: 60 min  Stress: No Stress Concern Present (06/18/2021)   [place]    Feeling of Stress : Only a little  Social Connections: Socially Integrated (06/18/2021)   Social Connection and Isolation Panel [NHANES]    Frequency of Communication with Friends and Family: More than three times a week    Frequency of Social Gatherings with Friends and Family: More than three times a week    Attends Religious Services: More than 4 times per year    Active Member of [employer] or Organizations: Yes    Attends Archivist Meetings: More than 4 times per year    Marital Status: Married  Human resources officer Violence: Not At Risk (04/26/2021)   Humiliation, Afraid, Rape, and Kick questionnaire    Fear of Current or Ex-Partner: No    Emotionally Abused: No    Physically Abused: No    Sexually Abused: No                                                                                                  Objective:  Physical Exam: BP 138/82 (BP Location: Left Arm, Patient Position: Sitting, Cuff Size: Large)   Pulse 79   Temp (!) 97 F (36.1 C) (Temporal)   Wt 195 lb (88.5 kg)   SpO2 98%   BMI 31.47 kg/m    General: No acute distress. Awake and conversant.  Eyes: Normal conjunctiva, anicteric. Round symmetric pupils.  ENT: Hearing grossly intact. No nasal discharge.  Bilateral tympanic membranes normal, auricles nontender, no oropharyngeal lesions, MMM Neck: Neck is supple. No masses or thyromegaly.  Respiratory: Respirations are non-labored.  CTAB Skin: Warm. No rashes or ulcers.  Psych: Alert and oriented. Cooperative, Appropriate mood and affect, Normal judgment.  CV: No cyanosis or JVD, RRR, no MRG MSK: Normal ambulation. No clubbing  Neuro: Sensation and CN II-XII grossly normal.        Alesia Banda, MD, MS

## 2022-02-20 ENCOUNTER — Ambulatory Visit: Payer: Medicare PPO | Admitting: Family Medicine

## 2022-03-02 ENCOUNTER — Telehealth: Payer: Self-pay

## 2022-03-02 DIAGNOSIS — M170 Bilateral primary osteoarthritis of knee: Secondary | ICD-10-CM | POA: Diagnosis not present

## 2022-03-02 NOTE — Telephone Encounter (Signed)
Surgical clearance forms received on 03/02/22 Forms have been placed on PCP desk

## 2022-03-04 ENCOUNTER — Encounter: Payer: Self-pay | Admitting: Cardiology

## 2022-03-04 ENCOUNTER — Encounter: Payer: Self-pay | Admitting: Family Medicine

## 2022-03-05 ENCOUNTER — Encounter: Payer: Self-pay | Admitting: Family Medicine

## 2022-03-05 ENCOUNTER — Telehealth: Payer: Self-pay | Admitting: *Deleted

## 2022-03-05 ENCOUNTER — Telehealth (INDEPENDENT_AMBULATORY_CARE_PROVIDER_SITE_OTHER): Payer: Medicare PPO | Admitting: Family Medicine

## 2022-03-05 DIAGNOSIS — J101 Influenza due to other identified influenza virus with other respiratory manifestations: Secondary | ICD-10-CM

## 2022-03-05 DIAGNOSIS — J189 Pneumonia, unspecified organism: Secondary | ICD-10-CM | POA: Diagnosis not present

## 2022-03-05 MED ORDER — PREDNISONE 10 MG PO TABS: ORAL_TABLET | ORAL | 0 refills | Status: DC

## 2022-03-05 MED ORDER — ALBUTEROL SULFATE HFA 108 (90 BASE) MCG/ACT IN AERS: 2.0000 | INHALATION_SPRAY | Freq: Four times a day (QID) | RESPIRATORY_TRACT | 1 refills | Status: DC | PRN

## 2022-03-05 MED ORDER — DOXYCYCLINE HYCLATE 100 MG PO TABS: ORAL_TABLET | ORAL | 0 refills | Status: DC

## 2022-03-05 MED ORDER — HYDROCODONE BIT-HOMATROP MBR 5-1.5 MG/5ML PO SOLN: 5.0000 mL | Freq: Three times a day (TID) | ORAL | 0 refills | Status: DC | PRN

## 2022-03-05 NOTE — Progress Notes (Signed)
Virtual Visit via Video Note  I connected with Michael Hodge on 03/05/22 at  4:00 PM EST by a video enabled telemedicine application and verified that I am speaking with the correct person using two identifiers.  Location patient: [place] Location provider:work or home office Persons participating in the virtual visit: patient, provider  I discussed the limitations and requested verbal permission for telemedicine visit. The patient expressed understanding and agreed to proceed.   HPI: 72-year-old male being seen today for cough. Background information: 01/25/2022 patient diagnosed with acute sinusitis by ED-visit and was prescribed Augmentin. Influenza A positive on 01/31/2022.  Hycodan prescribed. On 02/06/2022 was seen for ongoing respiratory symptoms and diagnosed with postviral syndrome and prescribed prednisone and Tessalon.  His symptoms gradually improved and then about 4 to 5 days ago his cough returned significantly.  No fever or SOB.  Coughing fits, some thick mucous production.  No CP.  No wheezing. Hycodan helps.  Minimal nasal/sinus congestion.  No n/v/d or abd pain.  No LE swelling/pain.  PMP AWARE reviewed today: most recent rx for Hydromet was filled 01/31/2022, # 120 mL, rx by Dr. Arlester Marker. No red flags.  ROS: See pertinent positives and negatives per HPI.  Past Medical History:  Diagnosis Date   Atrial fibrillation ([place])    11/2021   CAD (coronary artery disease)    CT demonstrated nonobstructive plaques (this test was prompted by borderline ETT.   Colitis 07/2020   Went to ER in [place], [place], resolved with Augmentin   Diabetes mellitus without complication ([place]) dx'd 02/6107   Fasting gluc 136 and HbA1c 6.7% at diagnosis.    Diverticulosis    GERD (gastroesophageal reflux disease)    occ   Hepatic steatosis 07/2020   noted on CT abd/pelv done for GE illness   History of colitis 08/06/2020   presumed infectious. CT abd/pelv w/contrast at [facility] med c[facility]->long segment  circumferential wall thickening of descending colon   History of nonmelanoma skin cancer    BCC   Hx of adenomatous colonic polyps    Hyperlipidemia    Hypertension    w/hypertensive retinopathy   Internal hemorrhoids    Obesity, Class II, BMI 35-39.9    Osteoarthritis of both knees    mod to severe 2022, steroid injections by ortho helped some.  Visco injections on the right --no significant help.    Past Surgical History:  Procedure Laterality Date   CARDIOVASCULAR STRESS TEST  02/2013   LexiScan/low-level exercise-Myoview (02/2013): No ischemia, EF 53%, occ symptomatic PVCs.   COLONOSCOPY  02/2021   2023 normal.  Recall 5 yrs   COLONOSCOPY W/ POLYPECTOMY  2005;08/21/10;11/29/15   adenomatous; also mild diverticulosis; recall 2017.  iFOB neg 09/2011.  11/2015: tubular adenoma x 1. 02/2021 normal->recall 57yr   HAMMER TOE SURGERY Right 05/24/2020   remvoal sebaceous cyst  15-20 years ago   TONSILLECTOMY     TRANSTHORACIC ECHOCARDIOGRAM     12/2021 EF 55-60%, grd I DD, mild LAE     Current Outpatient Medications:    amoxicillin-clavulanate (AUGMENTIN XR) 1000-62.5 MG 12 hr tablet, 1 tablet oral 7 days  every 12 hours (Patient not taking: Reported on 02/06/2022), Disp: , Rfl:    apixaban (ELIQUIS) 5 MG TABS tablet, Take 1 tablet (5 mg total) by mouth 2 (two) times daily., Disp: 60 tablet, Rfl: 6   atorvastatin (LIPITOR) 40 MG tablet, Take 1 tablet (40 mg total) by mouth daily., Disp: 90 tablet, Rfl: 3   benzonatate (TESSALON) 100 MG  capsule, Take 1 capsule (100 mg total) by mouth 3 (three) times daily as needed for cough., Disp: 20 capsule, Rfl: 0   diclofenac Sodium (VOLTAREN) 1 % GEL, Apply 4 g topically 4 (four) times daily., Disp: 100 g, Rfl: 3   glucose blood (FREESTYLE LITE) test strip, USE TO CHECK BLOOD SUGAR 1-2 TIMES DAILY AS INSTRUCTED., Disp: 50 each, Rfl: 3   hydrochlorothiazide (HYDRODIURIL) 25 MG tablet, Take 1 tablet (25 mg total) by mouth daily., Disp: 90 tablet, Rfl:  3   HYDROcodone bit-homatropine (HYCODAN) 5-1.5 MG/5ML syrup, Take 5 mLs by mouth every 8 (eight) hours as needed for cough., Disp: 120 mL, Rfl: 0   metFORMIN (GLUCOPHAGE XR) 500 MG 24 hr tablet, 2 tabs po daily with supper, Disp: 180 tablet, Rfl: 3   metoprolol tartrate (LOPRESSOR) 25 MG tablet, Take 1 tablet (25 mg total) by mouth as needed (racing heart beat). Take as needed for racing heart beat, Disp: 60 tablet, Rfl: 1   potassium chloride SA (KLOR-CON M20) 20 MEQ tablet, Take 2 tablets (40 mEq total) by mouth 2 (two) times daily., Disp: 360 tablet, Rfl: 3   predniSONE (STERAPRED UNI-PAK 21 TAB) 10 MG (21) TBPK tablet, Take as directed, Disp: 21 tablet, Rfl: 0  EXAM:  VITALS per patient if applicable:  GENERAL: alert, oriented, appears well and in no acute distress  HEENT: atraumatic, conjunttiva clear, no obvious abnormalities on inspection of external nose and ears  NECK: normal movements of the head and neck  LUNGS: on inspection no signs of respiratory distress, breathing rate appears normal, no obvious gross SOB, gasping or wheezing  CV: no obvious cyanosis  MS: moves all visible extremities without noticeable abnormality  PSYCH/NEURO: pleasant and cooperative, no obvious depression or anxiety, speech and thought processing grossly intact  LABS: none today    Chemistry      Component Value Date/Time   NA 137 12/22/2021 0931   NA 141 09/09/2017 0000   K 3.5 12/22/2021 0931   CL 100 12/22/2021 0931   CO2 29 12/22/2021 0931   BUN 30 (H) 12/22/2021 0931   BUN 29 (A) 09/09/2017 0000   CREATININE 1.09 12/22/2021 0931   GLU 117 09/09/2017 0000      Component Value Date/Time   CALCIUM 9.7 12/22/2021 0931   ALKPHOS 61 12/22/2021 0931   AST 22 12/22/2021 0931   ALT 22 12/22/2021 0931   BILITOT 1.0 12/22/2021 0931     Lab Results  Component Value Date   HGBA1C 6.2 (A) 12/22/2021   HGBA1C 6.2 12/22/2021   HGBA1C 6.2 12/22/2021   HGBA1C 6.2 12/22/2021     ASSESSMENT AND PLAN:  Discussed the following assessment and plan:  Prolonged resp illness (URI + flu 1 mo ago), now worrisome for CAP. Some signs of RAD component. Doxycycline 100 bid x 7d, prednisone taper (40 x 2, 30 x 2, 20 x 2, 1 x 2), albuterol inhaler, and hycodan susps rx'd today. Therapeutic expectations and side effect profile of medication discussed today.  Patient's questions answered.    I discussed the assessment and treatment plan with the patient. The patient was provided an opportunity to ask questions and all were answered. The patient agreed with the plan and demonstrated an understanding of the instructions.   F/u: if not signif improving in 2-3d  Signed:  Crissie Sickles, MD           03/05/2022

## 2022-03-05 NOTE — Telephone Encounter (Signed)
Form received and placed on PCP desk for review and completion

## 2022-03-05 NOTE — Telephone Encounter (Signed)
Patient with diagnosis of afib on Eliquis for anticoagulation.    Procedure:  RIGHT TOTAL KNEE REPLACEMENT  Date of procedure: TBD   CHA2DS2-VASc Score = 4   This indicates a 4.8% annual risk of stroke. The patient's score is based upon: CHF History: 0 HTN History: 1 Diabetes History: 1 Stroke History: 0 Vascular Disease History: 1 Age Score: 1 Gender Score: 0      CrCl 63 ml/min  Per office protocol, patient can hold Eliquis for 3 days prior to procedure.    **This guidance is not considered finalized until pre-operative APP has relayed final recommendations.**

## 2022-03-05 NOTE — Telephone Encounter (Signed)
   Name: Michael Hodge  DOB: 08/25/1950  MRN: [id number]  Primary Cardiologist: Candee Furbish, MD  Chart reviewed as part of pre-operative protocol coverage. Because of [name]'s past medical history and time since last visit, he will require a follow-up telephone visit in order to better assess preoperative cardiovascular risk.  Pre-op covering staff: - Please schedule appointment and call patient to inform them. If patient already had an upcoming appointment within acceptable timeframe, please add "pre-op clearance" to the appointment notes so provider is aware. - Please contact requesting surgeon's office via preferred method (i.e, phone, fax) to inform them of need for appointment prior to surgery.  Per office protocol, patient can hold Eliquis for 3 days prior to procedure.     Elgie Collard, PA-C  03/05/2022, 1:28 PM

## 2022-03-05 NOTE — Telephone Encounter (Signed)
   Pre-operative Risk Assessment    Patient Name: Michael Hodge  DOB: Jul 17, 1950 MRN: [id number]      Request for Surgical Clearance    Procedure:   RIGHT TOTAL KNEE REPLACEMENT  Date of Surgery:  Clearance TBD                                 Surgeon:  DR. Edmonia Lynch  Surgeon's Group or Practice Name:  [facility] Phone number:  [phone] ATTN: [place] EXT 5369 Fax number:  [phone]   Type of Clearance Requested:   - Medical  - Pharmacy:  Hold Apixaban (Eliquis)     Type of Anesthesia:  Spinal   Additional requests/questions:    Jiles Prows   03/05/2022, 8:42 AM

## 2022-03-06 ENCOUNTER — Telehealth: Payer: Self-pay | Admitting: *Deleted

## 2022-03-06 NOTE — Telephone Encounter (Signed)
Pt has been scheduled for tele pre op appt 03/08/22 @ 2:40. Med rec and consent are done.     Patient Consent for Virtual Visit        Michael Hodge has provided verbal consent on 03/06/2022 for a virtual visit (video or telephone).   CONSENT FOR VIRTUAL VISIT FOR:  Michael Hodge  By participating in this virtual visit I agree to the following:  I hereby voluntarily request, consent and authorize [facility] and its employed or contracted physicians, physician assistants, nurse practitioners or other licensed health care professionals (the Practitioner), to provide me with telemedicine health care services (the "Services") as deemed necessary by the treating Practitioner. I acknowledge and consent to receive the Services by the Practitioner via telemedicine. I understand that the telemedicine visit will involve communicating with the Practitioner through live audiovisual communication technology and the disclosure of certain medical information by electronic transmission. I acknowledge that I have been given the opportunity to request an in-person assessment or other available alternative prior to the telemedicine visit and am voluntarily participating in the telemedicine visit.  I understand that I have the right to withhold or withdraw my consent to the use of telemedicine in the course of my care at any time, without affecting my right to future care or treatment, and that the Practitioner or I may terminate the telemedicine visit at any time. I understand that I have the right to inspect all information obtained and/or recorded in the course of the telemedicine visit and may receive copies of available information for a reasonable fee.  I understand that some of the potential risks of receiving the Services via telemedicine include:  Delay or interruption in medical evaluation due to technological equipment failure or disruption; Information transmitted may not be sufficient (e.g. poor  resolution of images) to allow for appropriate medical decision making by the Practitioner; and/or  In rare instances, security protocols could fail, causing a breach of personal health information.  Furthermore, I acknowledge that it is my responsibility to provide information about my medical history, conditions and care that is complete and accurate to the best of my ability. I acknowledge that Practitioner's advice, recommendations, and/or decision may be based on factors not within their control, such as incomplete or inaccurate data provided by me or distortions of diagnostic images or specimens that may result from electronic transmissions. I understand that the practice of medicine is not an exact science and that Practitioner makes no warranties or guarantees regarding treatment outcomes. I acknowledge that a copy of this consent can be made available to me via my patient portal ([facility]), or I can request a printed copy by calling the office of [facility].    I understand that my insurance will be billed for this visit.   I have read or had this consent read to me. I understand the contents of this consent, which adequately explains the benefits and risks of the Services being provided via telemedicine.  I have been provided ample opportunity to ask questions regarding this consent and the Services and have had my questions answered to my satisfaction. I give my informed consent for the services to be provided through the use of telemedicine in my medical care

## 2022-03-06 NOTE — Telephone Encounter (Signed)
Pt has been scheduled for tele pre op appt 03/08/22 @ 2:40. Med rec and consent are done.

## 2022-03-07 ENCOUNTER — Telehealth: Payer: Self-pay | Admitting: Family Medicine

## 2022-03-07 NOTE — Progress Notes (Unsigned)
Virtual Visit via Telephone Note   Because of Michael Hodge's co-morbid illnesses, he is at least at moderate risk for complications without adequate follow up.  This format is felt to be most appropriate for this patient at this time.  The patient did not have access to video technology/had technical difficulties with video requiring transitioning to audio format only (telephone).  All issues noted in this document were discussed and addressed.  No physical exam could be performed with this format.  Please refer to the patient's chart for his consent to telehealth for [facility].  Evaluation Performed:  Preoperative cardiovascular risk assessment _____________   Date:  03/07/2022   Patient ID:  Michael Hodge, DOB Mar 27, 1950, MRN [id number] Patient Location:  Home Provider location:   Office  Primary Care Provider:  Tammi Sou, MD Primary Cardiologist:  Candee Furbish, MD  Chief Complaint / Patient Profile   72 y.o. y/o male with a h/o CAD with coronary calcium score 44.5 07/2010, HTN, HLD, diabetes, PAF on chronic anticoagulation who is pending right total knee replacement and presents today for telephonic preoperative cardiovascular risk assessment.  History of Present Illness    Michael Hodge is a 72 y.o. male who presents via audio/video conferencing for a telehealth visit today.  Pt was last seen in cardiology clinic on 12/27/21 by Dr. Marlou Porch.  At that time Michael Hodge was doing well and was planning to travel to [place].  The patient is now pending procedure as outlined above. Since his last visit, he denies chest pain, shortness of breath, lower extremity edema, fatigue, palpitations, melena, hematuria, hemoptysis, diaphoresis, weakness, presyncope, syncope, orthopnea, and PND. Is able to walk and do heavy lifting, but is unable to walk down an incline or stairs without going very slowly one step at the time.   Past Medical History    Past Medical History:   Diagnosis Date   Atrial fibrillation ([place])    11/2021   CAD (coronary artery disease)    CT demonstrated nonobstructive plaques (this test was prompted by borderline ETT.   Colitis 07/2020   Went to ER in [place], [place], resolved with Augmentin   Diabetes mellitus without complication ([place]) dx'd 04/8248   Fasting gluc 136 and HbA1c 6.7% at diagnosis.    Diverticulosis    GERD (gastroesophageal reflux disease)    occ   Hepatic steatosis 07/2020   noted on CT abd/pelv done for GE illness   History of colitis 08/06/2020   presumed infectious. CT abd/pelv w/contrast at [facility] med ctr->long segment circumferential wall thickening of descending colon   History of nonmelanoma skin cancer    BCC   Hx of adenomatous colonic polyps    Hyperlipidemia    Hypertension    w/hypertensive retinopathy   Internal hemorrhoids    Obesity, Class II, BMI 35-39.9    Osteoarthritis of both knees    mod to severe 2022, steroid injections by ortho helped some.  Visco injections on the right --no significant help.   Past Surgical History:  Procedure Laterality Date   CARDIOVASCULAR STRESS TEST  02/2013   LexiScan/low-level exercise-Myoview (02/2013): No ischemia, EF 53%, occ symptomatic PVCs.   COLONOSCOPY  02/2021   2023 normal.  Recall 5 yrs   COLONOSCOPY W/ POLYPECTOMY  2005;08/21/10;11/29/15   adenomatous; also mild diverticulosis; recall 2017.  iFOB neg 09/2011.  11/2015: tubular adenoma x 1. 02/2021 normal->recall 40yr   HAMMER TOE SURGERY Right 05/24/2020   remvoal sebaceous  cyst  15-20 years ago   TONSILLECTOMY     TRANSTHORACIC ECHOCARDIOGRAM     12/2021 EF 55-60%, grd I DD, mild LAE    Allergies  No Known Allergies  Home Medications    Prior to Admission medications   Medication Sig Start Date End Date Taking? Authorizing Provider  albuterol (VENTOLIN HFA) 108 (90 Base) MCG/ACT inhaler Inhale 2 puffs into the lungs every 6 (six) hours as needed for wheezing or shortness of breath. 03/05/22    McGowen, Adrian Blackwater, MD  apixaban (ELIQUIS) 5 MG TABS tablet Take 1 tablet (5 mg total) by mouth 2 (two) times daily. 12/27/21   [name], MD  atorvastatin (LIPITOR) 40 MG tablet Take 1 tablet (40 mg total) by mouth daily. 12/22/21   McGowen, Adrian Blackwater, MD  benzonatate (TESSALON) 100 MG capsule Take 1 capsule (100 mg total) by mouth 3 (three) times daily as needed for cough. Patient not taking: Reported on 03/05/2022 02/06/22   Bonnita Hollow, MD  diclofenac Sodium (VOLTAREN) 1 % GEL Apply 4 g topically 4 (four) times daily. 12/26/21   McGowen, Adrian Blackwater, MD  doxycycline (VIBRA-TABS) 100 MG tablet 1 tab p.o. twice daily 03/05/22   McGowen, Adrian Blackwater, MD  glucose blood (FREESTYLE LITE) test strip USE TO CHECK BLOOD SUGAR 1-2 TIMES DAILY AS INSTRUCTED. 06/22/21   McGowen, Adrian Blackwater, MD  hydrochlorothiazide (HYDRODIURIL) 25 MG tablet Take 1 tablet (25 mg total) by mouth daily. 12/22/21   McGowen, Adrian Blackwater, MD  HYDROcodone bit-homatropine (HYCODAN) 5-1.5 MG/5ML syrup Take 5 mLs by mouth every 8 (eight) hours as needed for cough. 03/05/22   Tammi Sou, MD  metFORMIN (GLUCOPHAGE XR) 500 MG 24 hr tablet 2 tabs po daily with supper 12/22/21   McGowen, Adrian Blackwater, MD  metoprolol tartrate (LOPRESSOR) 25 MG tablet Take 1 tablet (25 mg total) by mouth as needed (racing heart beat). Take as needed for racing heart beat 12/27/21   [name], MD  potassium chloride SA (KLOR-CON M20) 20 MEQ tablet Take 2 tablets (40 mEq total) by mouth 2 (two) times daily. 12/22/21   McGowen, Adrian Blackwater, MD  predniSONE (DELTASONE) 10 MG tablet 4 po qd x 2d then 3 po qd x 2d then 2 po qd x 2d then 1 po qd x 2d 03/05/22   McGowen, Adrian Blackwater, MD    Physical Exam    Vital Signs:  Tania Ade does not have vital signs available for review today.  Given telephonic nature of communication, physical exam is limited. AAOx3. NAD. Normal affect.  Speech and respirations are unlabored.  Accessory Clinical Findings     None  Assessment & Plan    1.  Preoperative Cardiovascular Risk Assessment: The patient is doing well from a cardiac perspective. Therefore, based on ACC/AHA guidelines, the patient would be at acceptable risk for the planned procedure without further cardiovascular testing.  According to the Revised Cardiac Risk Index (RCRI), his Perioperative Risk of Major Cardiac Event is (%): 0.9 His Functional Capacity in METs is: 7.59 according to the Duke Activity Status Index (DASI).  The patient was advised that if he develops new symptoms prior to surgery to contact our office to arrange for a follow-up visit, and he verbalized understanding.  Per office protocol, patient can hold Eliquis for 3 days prior to procedure.      A copy of this note will be routed to requesting surgeon.  Time:   Today, I have spent  8 minutes with the patient with telehealth technology discussing medical history, symptoms, and management plan.     Emmaline Life, NP-C  03/08/2022, 2:36 PM [address], [address] Office [phone] Fax [phone]

## 2022-03-07 NOTE — Telephone Encounter (Signed)
Michael Hodge was instructed to call if he was not any better. He states he is probably worse if anything. He had a virtual visit on Monday. Dr. Anitra Lauth doesnt have anything available until Monday.Marland KitchenMarland KitchenMarland KitchenHe is wanting to know if he can be worked in somehow.  Please give the patient a call.

## 2022-03-07 NOTE — Telephone Encounter (Signed)
Pt was seen 03/05/22 for virtual visit, advised to f/u 3-4 days if no improvement.

## 2022-03-07 NOTE — Telephone Encounter (Signed)
How about 11:20 Thursday

## 2022-03-08 ENCOUNTER — Ambulatory Visit (INDEPENDENT_AMBULATORY_CARE_PROVIDER_SITE_OTHER): Payer: Medicare PPO | Admitting: Family Medicine

## 2022-03-08 ENCOUNTER — Encounter: Payer: Self-pay | Admitting: Nurse Practitioner

## 2022-03-08 ENCOUNTER — Ambulatory Visit (INDEPENDENT_AMBULATORY_CARE_PROVIDER_SITE_OTHER)
Admission: RE | Admit: 2022-03-08 | Discharge: 2022-03-08 | Disposition: A | Discharge status: Home / Self Care | Payer: Medicare PPO | Source: Ambulatory Visit | Attending: Family Medicine | Admitting: Family Medicine

## 2022-03-08 ENCOUNTER — Ambulatory Visit: Payer: Medicare PPO | Attending: Cardiology | Admitting: Nurse Practitioner

## 2022-03-08 ENCOUNTER — Encounter: Payer: Self-pay | Admitting: Family Medicine

## 2022-03-08 VITALS — BP 121/61 | HR 72 | Temp 97.9°F | Wt 193.2 lb

## 2022-03-08 DIAGNOSIS — R059 Cough, unspecified: Secondary | ICD-10-CM | POA: Diagnosis not present

## 2022-03-08 DIAGNOSIS — R052 Subacute cough: Secondary | ICD-10-CM | POA: Diagnosis not present

## 2022-03-08 DIAGNOSIS — Z01810 Encounter for preprocedural cardiovascular examination: Secondary | ICD-10-CM

## 2022-03-08 MED ORDER — CEFTRIAXONE SODIUM 1 G IJ SOLR
1.0000 g | Freq: Once | INTRAMUSCULAR | Status: AC
  Administered 2022-03-08: 1 g via INTRAMUSCULAR

## 2022-03-08 MED ORDER — PREDNISONE 10 MG PO TABS: ORAL_TABLET | ORAL | 0 refills | Status: DC

## 2022-03-08 MED ORDER — CEFDINIR 300 MG PO CAPS: 300.0000 mg | ORAL_CAPSULE | Freq: Two times a day (BID) | ORAL | 0 refills | Status: DC

## 2022-03-08 NOTE — Progress Notes (Signed)
OFFICE VISIT  03/08/2022  CC:  Chief Complaint  Patient presents with   Cough    Patient is a 72 y.o. male who presents for ongoing respiratory concerns. I last saw him 3 days ago. A/P as of that visit: "Prolonged resp illness (URI + flu 1 mo ago), now worrisome for CAP. Some signs of RAD component. Doxycycline 100 bid x 7d, prednisone taper (40 x 2, 30 x 2, 20 x 2, 1 x 2), albuterol inhaler, and hycodan susps rx'd today."  HPI: He has not improved any regarding bad cough.  Symptoms get significantly worse at night. No fever.  Mild feeling of shortness of breath but mainly this is connected to his coughing fits. No wheezing. No problems with the medications. No URI symptoms at this time.  He is eating and drinking fine.  Past Medical History:  Diagnosis Date   Atrial fibrillation ([place])    11/2021   CAD (coronary artery disease)    CT demonstrated nonobstructive plaques (this test was prompted by borderline ETT.   Colitis 07/2020   Went to ER in [place], [place], resolved with Augmentin   Diabetes mellitus without complication ([place]) dx'd 04/8248   Fasting gluc 136 and HbA1c 6.7% at diagnosis.    Diverticulosis    GERD (gastroesophageal reflux disease)    occ   Hepatic steatosis 07/2020   noted on CT abd/pelv done for GE illness   History of colitis 08/06/2020   presumed infectious. CT abd/pelv w/contrast at [facility] med c[facility]->long segment circumferential wall thickening of descending colon   History of nonmelanoma skin cancer    BCC   Hx of adenomatous colonic polyps    Hyperlipidemia    Hypertension    w/hypertensive retinopathy   Internal hemorrhoids    Obesity, Class II, BMI 35-39.9    Osteoarthritis of both knees    mod to severe 2022, steroid injections by ortho helped some.  Visco injections on the right --no significant help.    Past Surgical History:  Procedure Laterality Date   CARDIOVASCULAR STRESS TEST  02/2013   LexiScan/low-level exercise-Myoview (02/2013):  No ischemia, EF 53%, occ symptomatic PVCs.   COLONOSCOPY  02/2021   2023 normal.  Recall 5 yrs   COLONOSCOPY W/ POLYPECTOMY  2005;08/21/10;11/29/15   adenomatous; also mild diverticulosis; recall 2017.  iFOB neg 09/2011.  11/2015: tubular adenoma x 1. 02/2021 normal->recall 48yr   HAMMER TOE SURGERY Right 05/24/2020   remvoal sebaceous cyst  15-20 years ago   TONSILLECTOMY     TRANSTHORACIC ECHOCARDIOGRAM     12/2021 EF 55-60%, grd I DD, mild LAE    Outpatient Medications Prior to Visit  Medication Sig Dispense Refill   albuterol (VENTOLIN HFA) 108 (90 Base) MCG/ACT inhaler Inhale 2 puffs into the lungs every 6 (six) hours as needed for wheezing or shortness of breath. 8 g 1   apixaban (ELIQUIS) 5 MG TABS tablet Take 1 tablet (5 mg total) by mouth 2 (two) times daily. 60 tablet 6   atorvastatin (LIPITOR) 40 MG tablet Take 1 tablet (40 mg total) by mouth daily. 90 tablet 3   benzonatate (TESSALON) 100 MG capsule Take 1 capsule (100 mg total) by mouth 3 (three) times daily as needed for cough. 20 capsule 0   diclofenac Sodium (VOLTAREN) 1 % GEL Apply 4 g topically 4 (four) times daily. 100 g 3   glucose blood (FREESTYLE LITE) test strip USE TO CHECK BLOOD SUGAR 1-2 TIMES DAILY AS INSTRUCTED. 50 each 3  hydrochlorothiazide (HYDRODIURIL) 25 MG tablet Take 1 tablet (25 mg total) by mouth daily. 90 tablet 3   HYDROcodone bit-homatropine (HYCODAN) 5-1.5 MG/5ML syrup Take 5 mLs by mouth every 8 (eight) hours as needed for cough. 120 mL 0   metFORMIN (GLUCOPHAGE XR) 500 MG 24 hr tablet 2 tabs po daily with supper 180 tablet 3   metoprolol tartrate (LOPRESSOR) 25 MG tablet Take 1 tablet (25 mg total) by mouth as needed (racing heart beat). Take as needed for racing heart beat 60 tablet 1   potassium chloride SA (KLOR-CON M20) 20 MEQ tablet Take 2 tablets (40 mEq total) by mouth 2 (two) times daily. 360 tablet 3   doxycycline (VIBRA-TABS) 100 MG tablet 1 tab p.o. twice daily 14 tablet 0   predniSONE  (DELTASONE) 10 MG tablet 4 po qd x 2d then 3 po qd x 2d then 2 po qd x 2d then 1 po qd x 2d 20 tablet 0   No facility-administered medications prior to visit.    No Known Allergies  Review of Systems  As per HPI  PE:    03/08/2022   11:01 AM 02/06/2022    2:47 PM 02/06/2022    2:15 PM  Vitals with BMI  Weight 193 lbs 3 oz  195 lbs  BMI   75.64  Systolic 332 951 884  Diastolic 61 82 84  Pulse 72  79  02 sat 99% RA today  Physical Exam  Gen: Alert, well appearing.  Patient is oriented to person, place, time, and situation. AFFECT: pleasant, lucid thought and speech. ZYS:AYTK: no injection, icteris, swelling, or exudate.  EOMI, PERRLA. Mouth: lips without lesion/swelling.  Oral mucosa pink and moist. Oropharynx without erythema, exudate, or swelling.  CV: Irreg irreg, rate 70-80, no m/r/g.   LUNGS: CTA bilat, nonlabored resps, good aeration in all lung fields. EXT: no clubbing or cyanosis.  no edema.    LABS:  Last CBC Lab Results  Component Value Date   WBC 6.9 06/22/2021   HGB 13.9 06/22/2021   HCT 40.9 06/22/2021   MCV 91.0 06/22/2021   RDW 14.3 06/22/2021   PLT 189.0 16/02/930   Last metabolic panel Lab Results  Component Value Date   GLUCOSE 104 (H) 12/22/2021   NA 137 12/22/2021   K 3.5 12/22/2021   CL 100 12/22/2021   CO2 29 12/22/2021   BUN 30 (H) 12/22/2021   CREATININE 1.09 12/22/2021   CALCIUM 9.7 12/22/2021   PROT 6.6 12/22/2021   ALBUMIN 4.4 12/22/2021   BILITOT 1.0 12/22/2021   ALKPHOS 61 12/22/2021   AST 22 12/22/2021   ALT 22 12/22/2021   Last hemoglobin A1c Lab Results  Component Value Date   HGBA1C 6.2 (A) 12/22/2021   HGBA1C 6.2 12/22/2021   HGBA1C 6.2 12/22/2021   HGBA1C 6.2 12/22/2021   IMPRESSION AND PLAN:  Prolonged resp illness (URI , + influenza 1 mo ago), now sx's worrisome for CAP. Not improved on 48 hours of prednisone and doxycycline as well as albuterol and Hydromet. Rapid covid test here today is negative. Will  obtain chest x-ray. Will stop doxycycline and start cefdinir tomorrow.  He was given a shot of Rocephin 1 g IM here today. He will take 40 mg of prednisone today, tomorrow, and the next day.  He will then be out of his current prednisone prescription.  I sent in a new prescription today for him to start at that time-- 30 mg a day x 2 days, 20  mg a day x 2 days, then 10 mg a day x 2 days. Continue Ventolin and Hydromet as needed. Also, start over-the-counter Prilosec or Prevacid at the 40 mg daily dose for a while.  An After Visit Summary was printed and given to the patient.  FOLLOW UP: Return if symptoms worsen or fail to improve.  Signed:  Crissie Sickles, MD           03/08/2022

## 2022-03-08 NOTE — Telephone Encounter (Signed)
Patient scheduled at 11:20AM today

## 2022-03-08 NOTE — Addendum Note (Signed)
Addended by: Beatrix Fetters on: 03/08/2022 03:56 PM   Modules accepted: Orders

## 2022-03-08 NOTE — Patient Instructions (Addendum)
Stop doxycycline. Take 4 prednisone tabs today, tomorrow, and Saturday. Then start the new prednisone rx as directed on the bottle. Take TWO of the over the counter strength prilosec or prevacid every day.

## 2022-03-09 ENCOUNTER — Encounter: Payer: Self-pay | Admitting: Family Medicine

## 2022-03-09 NOTE — Telephone Encounter (Signed)
No results available yet. Will call imaging for review.

## 2022-03-09 NOTE — Telephone Encounter (Signed)
Provider has contacted patient with results.

## 2022-03-09 NOTE — Telephone Encounter (Signed)
Chest XR reviewed.  Please review and advise

## 2022-03-14 DIAGNOSIS — H524 Presbyopia: Secondary | ICD-10-CM | POA: Diagnosis not present

## 2022-03-14 DIAGNOSIS — H40043 Steroid responder, bilateral: Secondary | ICD-10-CM | POA: Diagnosis not present

## 2022-03-14 DIAGNOSIS — E119 Type 2 diabetes mellitus without complications: Secondary | ICD-10-CM | POA: Diagnosis not present

## 2022-03-14 DIAGNOSIS — H35033 Hypertensive retinopathy, bilateral: Secondary | ICD-10-CM | POA: Diagnosis not present

## 2022-03-14 DIAGNOSIS — H2513 Age-related nuclear cataract, bilateral: Secondary | ICD-10-CM | POA: Diagnosis not present

## 2022-03-14 DIAGNOSIS — I10 Essential (primary) hypertension: Secondary | ICD-10-CM | POA: Diagnosis not present

## 2022-03-14 DIAGNOSIS — H53143 Visual discomfort, bilateral: Secondary | ICD-10-CM | POA: Diagnosis not present

## 2022-03-14 LAB — HM DIABETES EYE EXAM

## 2022-03-19 ENCOUNTER — Encounter: Payer: Self-pay | Admitting: Cardiology

## 2022-03-19 DIAGNOSIS — I480 Paroxysmal atrial fibrillation: Secondary | ICD-10-CM

## 2022-03-23 NOTE — Telephone Encounter (Signed)
Let's have him referred to EP to discuss ablation. I think this will help him feel better. Candee Furbish, MD    Dr Marlou Porch' order has been sent to pt via MyChart and EP referral placed.

## 2022-03-26 ENCOUNTER — Ambulatory Visit: Payer: Medicare PPO | Admitting: Family Medicine

## 2022-03-26 ENCOUNTER — Ambulatory Visit (INDEPENDENT_AMBULATORY_CARE_PROVIDER_SITE_OTHER)
Admission: RE | Admit: 2022-03-26 | Discharge: 2022-03-26 | Disposition: A | Discharge status: Home / Self Care | Payer: Medicare PPO | Source: Ambulatory Visit | Attending: Family Medicine | Admitting: Family Medicine

## 2022-03-26 ENCOUNTER — Encounter: Payer: Self-pay | Admitting: Family Medicine

## 2022-03-26 ENCOUNTER — Telehealth: Payer: Self-pay | Admitting: Family Medicine

## 2022-03-26 VITALS — BP 114/67 | HR 63 | Temp 98.3°F | Ht 66.0 in | Wt 189.6 lb

## 2022-03-26 DIAGNOSIS — J101 Influenza due to other identified influenza virus with other respiratory manifestations: Secondary | ICD-10-CM

## 2022-03-26 DIAGNOSIS — J209 Acute bronchitis, unspecified: Secondary | ICD-10-CM

## 2022-03-26 DIAGNOSIS — R059 Cough, unspecified: Secondary | ICD-10-CM | POA: Diagnosis not present

## 2022-03-26 DIAGNOSIS — R052 Subacute cough: Secondary | ICD-10-CM

## 2022-03-26 LAB — POC COVID19 BINAXNOW: SARS Coronavirus 2 Ag: NEGATIVE

## 2022-03-26 MED ORDER — HYDROCODONE BIT-HOMATROP MBR 5-1.5 MG/5ML PO SOLN: ORAL | 0 refills | Status: DC

## 2022-03-26 MED ORDER — BUDESONIDE-FORMOTEROL FUMARATE 160-4.5 MCG/ACT IN AERO: 2.0000 | INHALATION_SPRAY | Freq: Two times a day (BID) | RESPIRATORY_TRACT | 1 refills | Status: DC

## 2022-03-26 MED ORDER — PREDNISONE 10 MG PO TABS: ORAL_TABLET | ORAL | 0 refills | Status: DC

## 2022-03-26 NOTE — Patient Instructions (Signed)
Elevate the head of your bed (ideally with 6 inch  bed blocks). avoid fatty foods, chocolate, peppermint, colas, red wine, and acidic juices such as orange juice.  NO MINT OR MENTHOL PRODUCTS SO NO COUGH DROPS   USE SUGARLESS CANDY INSTEAD (Jolley ranchers or Stover's or Life Savers) or even ice chips will also do - the key is to swallow to prevent all throat clearing. NO OIL BASED VITAMINS - use powdered substitutes.

## 2022-03-26 NOTE — Telephone Encounter (Signed)
Please resend to pharmacy. Med pending

## 2022-03-26 NOTE — Telephone Encounter (Signed)
Pt is needing this called into the [facility] on [address]. The original pharmacy is out and needs this called directly into them.  HYDROcodone bit-homatropine (HYCODAN) 5-1.5 MG/5ML syrup

## 2022-03-26 NOTE — Telephone Encounter (Signed)
LVM for pt regarding pharmacy change for med.  Note: if pt returns call, please inform rx was sent to [facility].

## 2022-03-26 NOTE — Progress Notes (Signed)
OFFICE VISIT  03/26/2022  CC:  Chief Complaint  Patient presents with   Cough    Ongoing and chest congestion. After Predisone, Anti-biotics, etc, pt  improved for a few days but has been feeling worse every day since Thursday last week. C/o feeling lethargic and headache, used Tylenol. Last dose yesterday   Patient is a 72 y.o. male who presents for ongoing cough. I last saw him for this 03/08/22. A/P as of that visit: "Prolonged resp illness (URI , + influenza 1 mo ago), now sx's worrisome for CAP. Not improved on 48 hours of prednisone and doxycycline as well as albuterol and Hydromet. Rapid covid test here today is negative. Will obtain chest x-ray. Will stop doxycycline and start cefdinir tomorrow.  He was given a shot of Rocephin 1 g IM here today. He will take 40 mg of prednisone today, tomorrow, and the next day.  He will then be out of his current prednisone prescription.  I sent in a new prescription today for him to start at that time-- 30 mg a day x 2 days, 20 mg a day x 2 days, then 10 mg a day x 2 days. Continue Ventolin and Hydromet as needed. Also, start over-the-counter Prilosec or Prevacid at the 40 mg daily dose for a while."  HPI: Improved after I saw him last but never got back to feeling well. Chest x-ray 03/08/2022 showed mild peribronchial thickening without focal pneumonia. He is back to having frequent cough in "fits".  Headache across the eyebrows region.  Malaise.  No fever. coughing easily with deep breaths but denies wheezing or shortness of breath. He is not sure albuterol is helping any. Hydrocodone around bedtime helps him get through the night.  No chest pain, no dizziness, no nausea, no diarrhea, no abdominal pain, no rash.  Past Medical History:  Diagnosis Date   Atrial fibrillation ([place])    11/2021   CAD (coronary artery disease)    CT demonstrated nonobstructive plaques (this test was prompted by borderline ETT.   Colitis 07/2020   Went to ER  in [place], [place], resolved with Augmentin   Diabetes mellitus without complication ([place]) dx'd 10/3568   Fasting gluc 136 and HbA1c 6.7% at diagnosis.    Diverticulosis    GERD (gastroesophageal reflux disease)    occ   Hepatic steatosis 07/2020   noted on CT abd/pelv done for GE illness   History of colitis 08/06/2020   presumed infectious. CT abd/pelv w/contrast at [facility] med ctr->long segment circumferential wall thickening of descending colon   History of nonmelanoma skin cancer    BCC   Hx of adenomatous colonic polyps    Hyperlipidemia    Hypertension    w/hypertensive retinopathy   Internal hemorrhoids    Obesity, Class II, BMI 35-39.9    Osteoarthritis of both knees    mod to severe 2022, steroid injections by ortho helped some.  Visco injections on the right --no significant help.    Past Surgical History:  Procedure Laterality Date   CARDIOVASCULAR STRESS TEST  02/2013   LexiScan/low-level exercise-Myoview (02/2013): No ischemia, EF 53%, occ symptomatic PVCs.   COLONOSCOPY  02/2021   2023 normal.  Recall 5 yrs   COLONOSCOPY W/ POLYPECTOMY  2005;08/21/10;11/29/15   adenomatous; also mild diverticulosis; recall 2017.  iFOB neg 09/2011.  11/2015: tubular adenoma x 1. 02/2021 normal->recall 23yr   HAMMER TOE SURGERY Right 05/24/2020   remvoal sebaceous cyst  15-20 years ago   TONSILLECTOMY  TRANSTHORACIC ECHOCARDIOGRAM     12/2021 EF 55-60%, grd I DD, mild LAE    Outpatient Medications Prior to Visit  Medication Sig Dispense Refill   albuterol (VENTOLIN HFA) 108 (90 Base) MCG/ACT inhaler Inhale 2 puffs into the lungs every 6 (six) hours as needed for wheezing or shortness of breath. 8 g 1   apixaban (ELIQUIS) 5 MG TABS tablet Take 1 tablet (5 mg total) by mouth 2 (two) times daily. 60 tablet 6   atorvastatin (LIPITOR) 40 MG tablet Take 1 tablet (40 mg total) by mouth daily. 90 tablet 3   diclofenac Sodium (VOLTAREN) 1 % GEL Apply 4 g topically 4 (four) times daily. 100 g 3    glucose blood (FREESTYLE LITE) test strip USE TO CHECK BLOOD SUGAR 1-2 TIMES DAILY AS INSTRUCTED. 50 each 3   hydrochlorothiazide (HYDRODIURIL) 25 MG tablet Take 1 tablet (25 mg total) by mouth daily. 90 tablet 3   metFORMIN (GLUCOPHAGE XR) 500 MG 24 hr tablet 2 tabs po daily with supper 180 tablet 3   metoprolol tartrate (LOPRESSOR) 25 MG tablet Take 1 tablet (25 mg total) by mouth as needed (racing heart beat). Take as needed for racing heart beat 60 tablet 1   potassium chloride SA (KLOR-CON M20) 20 MEQ tablet Take 2 tablets (40 mEq total) by mouth 2 (two) times daily. 360 tablet 3   HYDROcodone bit-homatropine (HYCODAN) 5-1.5 MG/5ML syrup Take 5 mLs by mouth every 8 (eight) hours as needed for cough. 120 mL 0   benzonatate (TESSALON) 100 MG capsule Take 1 capsule (100 mg total) by mouth 3 (three) times daily as needed for cough. (Patient not taking: Reported on 03/26/2022) 20 capsule 0   cefdinir (OMNICEF) 300 MG capsule Take 1 capsule (300 mg total) by mouth 2 (two) times daily. (Patient not taking: Reported on 03/26/2022) 14 capsule 0   predniSONE (DELTASONE) 10 MG tablet 3 tabs po qd x 2d then 2 tabs po qd x 2d, then 1 tab po qd x 2d (Patient not taking: Reported on 03/26/2022) 12 tablet 0   No facility-administered medications prior to visit.    No Known Allergies  Review of Systems  As per HPI  PE:    03/26/2022    1:51 PM 03/08/2022   11:01 AM 02/06/2022    2:47 PM  Vitals with BMI  Height '5\' 6"'$     Weight 189 lbs 10 oz 193 lbs 3 oz   BMI 11.91    Systolic 478 295 621  Diastolic 67 61 82  Pulse 63 72     Physical Exam  Gen: Alert, well appearing.  Patient is oriented to person, place, time, and situation. AFFECT: pleasant, lucid thought and speech. HYQ:MVHQ: no injection, icteris, swelling, or exudate.  EOMI, PERRLA. Mouth: lips without lesion/swelling.  Oral mucosa pink and moist. Oropharynx without erythema, exudate, or swelling. Nose w/out mucous.  No sinus tenderness or  swelling or erythema. CV: RRR, no m/r/g.   LUNGS: CTA bilat, nonlabored resps, good aeration in all lung fields.  LABS:  Last CBC Lab Results  Component Value Date   WBC 6.9 06/22/2021   HGB 13.9 06/22/2021   HCT 40.9 06/22/2021   MCV 91.0 06/22/2021   RDW 14.3 06/22/2021   PLT 189.0 46/96/2952   Last metabolic panel Lab Results  Component Value Date   GLUCOSE 104 (H) 12/22/2021   NA 137 12/22/2021   K 3.5 12/22/2021   CL 100 12/22/2021   CO2 29 12/22/2021  BUN 30 (H) 12/22/2021   CREATININE 1.09 12/22/2021   CALCIUM 9.7 12/22/2021   PROT 6.6 12/22/2021   ALBUMIN 4.4 12/22/2021   BILITOT 1.0 12/22/2021   ALKPHOS 61 12/22/2021   AST 22 12/22/2021   ALT 22 12/22/2021   Last hemoglobin A1c Lab Results  Component Value Date   HGBA1C 6.2 (A) 12/22/2021   HGBA1C 6.2 12/22/2021   HGBA1C 6.2 12/22/2021   HGBA1C 6.2 12/22/2021   IMPRESSION AND PLAN:  #1 prolonged respiratory illness. Initially started with the flu and some reactive airways disease has been occurring after.  Also suspect some component of upper airway cough is contributing.  He has had incomplete resolution with systemic steroids and antibiotics over the last month. Covid test here today was NEG. Discussed upper airway cough syndrome, recommended to get back on omeprazole 40 mg every day for now. Will also treat with prednisone 40 mg x 3 days, 30 mg x 3 days, 20 mg x 3 days, and 10 mg x 3 days. Will do Symbicort 2 puffs twice daily, anticipate 2 to 3 weeks of treatment. Check CBC with differential, be met, and chest x-ray today. Refilled Hycodan today, 120 mL.  An After Visit Summary was printed and given to the patient.  FOLLOW UP: Return if symptoms worsen or fail to improve.  Signed:  Crissie Sickles, MD           03/26/2022

## 2022-03-27 LAB — CBC WITH DIFFERENTIAL/PLATELET
Basophils Absolute: 0 10*3/uL (ref 0.0–0.1)
Basophils Relative: 0.1 % (ref 0.0–3.0)
Eosinophils Absolute: 0.1 10*3/uL (ref 0.0–0.7)
Eosinophils Relative: 2.2 % (ref 0.0–5.0)
HCT: 37.1 % — ABNORMAL LOW (ref 39.0–52.0)
Hemoglobin: 12.5 g/dL — ABNORMAL LOW (ref 13.0–17.0)
Lymphocytes Relative: 21.7 % (ref 12.0–46.0)
Lymphs Abs: 0.6 10*3/uL — ABNORMAL LOW (ref 0.7–4.0)
MCHC: 33.7 g/dL (ref 30.0–36.0)
MCV: 89.5 fl (ref 78.0–100.0)
Monocytes Absolute: 0.2 10*3/uL (ref 0.1–1.0)
Monocytes Relative: 9.4 % (ref 3.0–12.0)
Neutro Abs: 1.7 10*3/uL (ref 1.4–7.7)
Neutrophils Relative %: 66.6 % (ref 43.0–77.0)
Platelets: 160 10*3/uL (ref 150.0–400.0)
RBC: 4.15 Mil/uL — ABNORMAL LOW (ref 4.22–5.81)
RDW: 14.3 % (ref 11.5–15.5)
WBC: 2.6 10*3/uL — ABNORMAL LOW (ref 4.0–10.5)

## 2022-03-27 LAB — BASIC METABOLIC PANEL
BUN: 16 mg/dL (ref 6–23)
CO2: 29 mEq/L (ref 19–32)
Calcium: 9.4 mg/dL (ref 8.4–10.5)
Chloride: 97 mEq/L (ref 96–112)
Creatinine, Ser: 0.9 mg/dL (ref 0.40–1.50)
GFR: 84.87 mL/min (ref >60.00)
Glucose, Bld: 167 mg/dL — ABNORMAL HIGH (ref 70–99)
Potassium: 3.9 mEq/L (ref 3.5–5.1)
Sodium: 135 mEq/L (ref 135–145)

## 2022-03-27 NOTE — Telephone Encounter (Signed)
Pt confirmed rx sent.

## 2022-04-11 ENCOUNTER — Encounter (INDEPENDENT_AMBULATORY_CARE_PROVIDER_SITE_OTHER): Payer: Medicare PPO | Admitting: Cardiology

## 2022-04-11 DIAGNOSIS — R42 Dizziness and giddiness: Secondary | ICD-10-CM

## 2022-04-11 DIAGNOSIS — R0602 Shortness of breath: Secondary | ICD-10-CM | POA: Diagnosis not present

## 2022-04-13 ENCOUNTER — Telehealth (HOSPITAL_COMMUNITY): Payer: Self-pay | Admitting: *Deleted

## 2022-04-13 NOTE — Telephone Encounter (Signed)
Pt reached and given instructions for MPI study scheduled on 04/16/22.

## 2022-04-13 NOTE — Telephone Encounter (Signed)
Please see the MyChart message reply(ies) for my assessment and plan.    This patient gave consent for this Medical Advice Message and is aware that it may result in a bill to [employer], as well as the possibility of receiving a bill for a co-payment or deductible. They are an established patient, but are not seeking medical advice exclusively about a problem treated during an in person or video visit in the last seven days. I did not recommend an in person or video visit within seven days of my reply.    I spent a total of 6 minutes cumulative time within 7 days through [employer].  Candee Furbish, MD

## 2022-04-16 ENCOUNTER — Ambulatory Visit (HOSPITAL_COMMUNITY): Payer: Medicare PPO | Attending: Cardiology

## 2022-04-16 DIAGNOSIS — R0602 Shortness of breath: Secondary | ICD-10-CM | POA: Insufficient documentation

## 2022-04-16 DIAGNOSIS — R42 Dizziness and giddiness: Secondary | ICD-10-CM | POA: Insufficient documentation

## 2022-04-16 LAB — MYOCARDIAL PERFUSION IMAGING
Base ST Depression (mm): 0 mm
LV dias vol: 106 mL (ref 62–150)
LV sys vol: 53 mL
Nuc Stress EF: 50 %
Peak HR: 91 {beats}/min
Rest HR: 61 {beats}/min
Rest Nuclear Isotope Dose: 10.2 mCi
SDS: 0
SRS: 0
SSS: 0
ST Depression (mm): 0 mm
Stress Nuclear Isotope Dose: 31.4 mCi
TID: 1.01

## 2022-04-16 MED ORDER — REGADENOSON 0.4 MG/5ML IV SOLN
0.4000 mg | Freq: Once | INTRAVENOUS | Status: AC
  Administered 2022-04-16: 0.4 mg via INTRAVENOUS

## 2022-04-16 MED ORDER — TECHNETIUM TC 99M TETROFOSMIN IV KIT
31.4000 | PACK | Freq: Once | INTRAVENOUS | Status: AC | PRN
  Administered 2022-04-16: 31.4 via INTRAVENOUS

## 2022-04-16 MED ORDER — TECHNETIUM TC 99M TETROFOSMIN IV KIT
10.2000 | PACK | Freq: Once | INTRAVENOUS | Status: AC | PRN
  Administered 2022-04-16: 10.2 via INTRAVENOUS

## 2022-04-17 ENCOUNTER — Encounter: Payer: Self-pay | Admitting: Family Medicine

## 2022-04-18 ENCOUNTER — Telehealth (INDEPENDENT_AMBULATORY_CARE_PROVIDER_SITE_OTHER): Payer: Medicare PPO | Admitting: Family Medicine

## 2022-04-18 ENCOUNTER — Encounter: Payer: Self-pay | Admitting: Family Medicine

## 2022-04-18 VITALS — HR 64

## 2022-04-18 DIAGNOSIS — N5201 Erectile dysfunction due to arterial insufficiency: Secondary | ICD-10-CM | POA: Diagnosis not present

## 2022-04-18 MED ORDER — SILDENAFIL CITRATE 100 MG PO TABS: ORAL_TABLET | ORAL | 6 refills | Status: DC

## 2022-04-18 NOTE — Progress Notes (Signed)
Virtual Visit via Video Note  I connected with Michael Hodge  on 04/18/22 at  8:20 AM EST by a video enabled telemedicine application and verified that I am speaking with the correct person using two identifiers.  Location patient: [place] Location provider:work or home office Persons participating in the virtual visit: patient, provider  I discussed the limitations and requested verbal permission for telemedicine visit. The patient expressed understanding and agreed to proceed.   HPI: 72 year old male being seen today for erectile problems. Has been coming on for a few months, difficulty getting full erection, difficulty penetrating partner and completing intercourse. Did not seem to start after any particular medication.  He does take metoprolol since getting diagnosed with A-fib several months ago but only takes this on a as needed basis.  04/16/2022 patient had a normal myocardial perfusion scan.  ROS: He has been having some calf nocturnal cramping lately.  Past Medical History:  Diagnosis Date   Atrial fibrillation ([place])    11/2021   CAD (coronary artery disease)    CT demonstrated nonobstructive plaques (this test was prompted by borderline ETT.   Colitis 07/2020   Went to ER in [place], [place], resolved with Augmentin   Diabetes mellitus without complication ([place]) dx'd [id number]   Fasting gluc 136 and HbA1c 6.7% at diagnosis.    Diverticulosis    GERD (gastroesophageal reflux disease)    occ   Hepatic steatosis 07/2020   noted on CT abd/pelv done for GE illness   History of colitis 08/06/2020   presumed infectious. CT abd/pelv w/contrast at [facility]->long segment circumferential wall thickening of descending colon   History of nonmelanoma skin cancer    BCC   Hx of adenomatous colonic polyps    Hyperlipidemia    Hypertension    w/hypertensive retinopathy   Internal hemorrhoids    Obesity, Class II, BMI 35-39.9    Osteoarthritis of both knees    mod to severe 2022, steroid  injections by ortho helped some.  Visco injections on the right --no significant help.    Past Surgical History:  Procedure Laterality Date   CARDIOVASCULAR STRESS TEST  02/2013   2015 LexiScan/low-level exercise-Myoview (02/2013): No ischemia, EF 53%, occ symptomatic PVCs.  03/2022 NORMAL/LOW RISK   COLONOSCOPY  02/2021   2023 normal.  Recall 5 yrs   COLONOSCOPY W/ POLYPECTOMY  2005;08/21/10;11/29/15   adenomatous; also mild diverticulosis; recall 2017.  iFOB neg 09/2011.  11/2015: tubular adenoma x 1. 02/2021 normal->recall 63yr   HAMMER TOE SURGERY Right 05/24/2020   remvoal sebaceous cyst  15-20 years ago   TONSILLECTOMY     TRANSTHORACIC ECHOCARDIOGRAM     12/2021 EF 55-60%, grd I DD, mild LAE     Current Outpatient Medications:    apixaban (ELIQUIS) 5 MG TABS tablet, Take 1 tablet (5 mg total) by mouth 2 (two) times daily., Disp: 60 tablet, Rfl: 6   atorvastatin (LIPITOR) 40 MG tablet, Take 1 tablet (40 mg total) by mouth daily., Disp: 90 tablet, Rfl: 3   budesonide-formoterol (SYMBICORT) 160-4.5 MCG/ACT inhaler, Inhale 2 puffs into the lungs 2 (two) times daily., Disp: 1 each, Rfl: 1   diclofenac Sodium (VOLTAREN) 1 % GEL, Apply 4 g topically 4 (four) times daily., Disp: 100 g, Rfl: 3   glucose blood (FREESTYLE LITE) test strip, USE TO CHECK BLOOD SUGAR 1-2 TIMES DAILY AS INSTRUCTED., Disp: 50 each, Rfl: 3   hydrochlorothiazide (HYDRODIURIL) 25 MG tablet, Take 1 tablet (25 mg total) by mouth daily., Disp:  90 tablet, Rfl: 3   metFORMIN (GLUCOPHAGE XR) 500 MG 24 hr tablet, 2 tabs po daily with supper, Disp: 180 tablet, Rfl: 3   metoprolol tartrate (LOPRESSOR) 25 MG tablet, Take 1 tablet (25 mg total) by mouth as needed (racing heart beat). Take as needed for racing heart beat, Disp: 60 tablet, Rfl: 1   potassium chloride SA (KLOR-CON M20) 20 MEQ tablet, Take 2 tablets (40 mEq total) by mouth 2 (two) times daily., Disp: 360 tablet, Rfl: 3  EXAM:  VITALS per patient if applicable:       [id number]    8:13 AM 04/16/2022   10:38 AM 03/26/2022    1:51 PM  Vitals with BMI  Height  5' 6"$  5' 6"$   Weight  189 lbs 189 lbs 10 oz  BMI  99991111 [id number]  Systolic   99991111  Diastolic   67  Pulse 64  63     GENERAL: alert, oriented, appears well and in no acute distress  HEENT: atraumatic, conjunttiva clear, no obvious abnormalities on inspection of external nose and ears  NECK: normal movements of the head and neck  LUNGS: on inspection no signs of respiratory distress, breathing rate appears normal, no obvious gross SOB, gasping or wheezing  CV: no obvious cyanosis  MS: moves all visible extremities without noticeable abnormality  PSYCH/NEURO: pleasant and cooperative, no obvious depression or anxiety, speech and thought processing grossly intact  LABS: none today    Chemistry      Component Value Date/Time   NA 135 03/26/2022 1435   NA 141 09/09/2017 0000   K 3.9 03/26/2022 1435   CL 97 03/26/2022 1435   CO2 29 03/26/2022 1435   BUN 16 03/26/2022 1435   BUN 29 (A) 09/09/2017 0000   CREATININE 0.90 03/26/2022 1435   GLU 117 09/09/2017 0000      Component Value Date/Time   CALCIUM 9.4 03/26/2022 1435   ALKPHOS 61 12/22/2021 0931   AST 22 12/22/2021 0931   ALT 22 12/22/2021 0931   BILITOT 1.0 12/22/2021 0931     Lab Results  Component Value Date   WBC 2.6 (L) 03/26/2022   HGB 12.5 (L) 03/26/2022   HCT 37.1 (L) 03/26/2022   MCV 89.5 03/26/2022   PLT 160.0 03/26/2022   Lab Results  Component Value Date   HGBA1C 6.2 (A) 12/22/2021   HGBA1C 6.2 12/22/2021   HGBA1C 6.2 12/22/2021   HGBA1C 6.2 12/22/2021   ASSESSMENT AND PLAN:  Discussed the following assessment and plan:  #1 erectile dysfunction. Trial of Viagra 100 mg tabs, 1/2-1 tab daily as needed.  #2 leg cramps. History of hypokalemia.  He is compliant with his potassium supplement: 40 mill equivalents twice a day. I recommended over-the-counter magnesium supplement and/or tonic water daily. He  will be getting preoperative lab work at the end of next month so we will see his potassium at that time.  3.  Recent prolonged bronchitis--resolved.  I discussed the assessment and treatment plan with the patient. The patient was provided an opportunity to ask questions and all were answered. The patient agreed with the plan and demonstrated an understanding of the instructions.   F/u: Has appointment set for next month.  Signed:  Crissie Sickles, MD           04/18/2022

## 2022-05-09 ENCOUNTER — Encounter: Payer: Self-pay | Admitting: Cardiology

## 2022-05-09 ENCOUNTER — Ambulatory Visit (INDEPENDENT_AMBULATORY_CARE_PROVIDER_SITE_OTHER): Payer: Medicare PPO | Admitting: Cardiology

## 2022-05-09 ENCOUNTER — Ambulatory Visit (INDEPENDENT_AMBULATORY_CARE_PROVIDER_SITE_OTHER): Payer: Medicare PPO

## 2022-05-09 ENCOUNTER — Ambulatory Visit: Payer: Medicare PPO | Attending: Cardiology | Admitting: Cardiology

## 2022-05-09 VITALS — BP 124/78 | HR 66 | Ht 66.0 in | Wt 194.0 lb

## 2022-05-09 VITALS — BP 124/78 | HR 60 | Ht 66.0 in | Wt 194.0 lb

## 2022-05-09 VITALS — Wt 185.0 lb

## 2022-05-09 DIAGNOSIS — Z0000 Encounter for general adult medical examination without abnormal findings: Secondary | ICD-10-CM

## 2022-05-09 DIAGNOSIS — I480 Paroxysmal atrial fibrillation: Secondary | ICD-10-CM

## 2022-05-09 DIAGNOSIS — E785 Hyperlipidemia, unspecified: Secondary | ICD-10-CM | POA: Diagnosis not present

## 2022-05-09 DIAGNOSIS — D6869 Other thrombophilia: Secondary | ICD-10-CM

## 2022-05-09 DIAGNOSIS — M1711 Unilateral primary osteoarthritis, right knee: Secondary | ICD-10-CM | POA: Diagnosis not present

## 2022-05-09 DIAGNOSIS — I2510 Atherosclerotic heart disease of native coronary artery without angina pectoris: Secondary | ICD-10-CM

## 2022-05-09 DIAGNOSIS — I10 Essential (primary) hypertension: Secondary | ICD-10-CM

## 2022-05-09 NOTE — Progress Notes (Signed)
Electrophysiology Office Note   Date:  05/09/2022   ID:  Michael Hodge, DOB May 26, 1950, MRN [id number]  PCP:  Tammi Sou, MD  Cardiologist:  Marlou Porch Primary Electrophysiologist:  Jaimee Corum Meredith Leeds, MD    Chief Complaint: AF   History of Present Illness: Michael Hodge is a 72 y.o. male who is being seen today for the evaluation of AF at the request of Michael Pain, MD. Presenting today for electrophysiology evaluation.  He has a history significant for coronary artery disease, hypertension, hyperlipidemia, diabetes, atrial fibrillation.  He saw his primary physician 12/22/2021 with an EKG done showing atrial fibrillation.  He was having palpitations at the time.  Today, he denies symptoms of palpitations, chest Hodge, shortness of breath, orthopnea, PND, lower extremity edema, claudication, dizziness, presyncope, syncope, bleeding, or neurologic sequela. The patient is tolerating medications without difficulties.  Since his diagnosis he has done well.  He has had a few episodes of palpitations and a strange feeling in his chest, but otherwise has felt well.  He does not have much fatigue or shortness of breath when he is in atrial fibrillation.   Past Medical History:  Diagnosis Date   Atrial fibrillation ([place])    11/2021   CAD (coronary artery disease)    CT demonstrated nonobstructive plaques (this test was prompted by borderline ETT.   Colitis 07/2020   Went to ER in [place], [place], resolved with Augmentin   Diabetes mellitus without complication ([place]) dx'd [id number]   Fasting gluc 136 and HbA1c 6.7% at diagnosis.    Diverticulosis    GERD (gastroesophageal reflux disease)    occ   Hepatic steatosis 07/2020   noted on CT abd/pelv done for GE illness   History of colitis 08/06/2020   presumed infectious. CT abd/pelv w/contrast at [facility]->long segment circumferential wall thickening of descending colon   History of nonmelanoma skin cancer    BCC   Hx of  adenomatous colonic polyps    Hyperlipidemia    Hypertension    w/hypertensive retinopathy   Internal hemorrhoids    Obesity, Class II, BMI 35-39.9    Osteoarthritis of both knees    mod to severe 2022, steroid injections by ortho helped some.  Visco injections on the right --no significant help.   Past Surgical History:  Procedure Laterality Date   CARDIOVASCULAR STRESS TEST  02/2013   2015 LexiScan/low-level exercise-Myoview (02/2013): No ischemia, EF 53%, occ symptomatic PVCs.  03/2022 NORMAL/LOW RISK   COLONOSCOPY  02/2021   2023 normal.  Recall 5 yrs   COLONOSCOPY W/ POLYPECTOMY  2005;08/21/10;11/29/15   adenomatous; also mild diverticulosis; recall 2017.  iFOB neg 09/2011.  11/2015: tubular adenoma x 1. 02/2021 normal->recall 91yr   HAMMER TOE SURGERY Right 05/24/2020   remvoal sebaceous cyst  15-20 years ago   TONSILLECTOMY     TRANSTHORACIC ECHOCARDIOGRAM     12/2021 EF 55-60%, grd I DD, mild LAE     Current Outpatient Medications  Medication Sig Dispense Refill   apixaban (ELIQUIS) 5 MG TABS tablet Take 1 tablet (5 mg total) by mouth 2 (two) times daily. 60 tablet 6   atorvastatin (LIPITOR) 40 MG tablet Take 1 tablet (40 mg total) by mouth daily. 90 tablet 3   diclofenac Sodium (VOLTAREN) 1 % GEL Apply 4 g topically 4 (four) times daily. 100 g 3   glucose blood (FREESTYLE LITE) test strip USE TO CHECK BLOOD SUGAR 1-2 TIMES DAILY AS INSTRUCTED. 50 each 3  hydrochlorothiazide (HYDRODIURIL) 25 MG tablet Take 1 tablet (25 mg total) by mouth daily. 90 tablet 3   metFORMIN (GLUCOPHAGE XR) 500 MG 24 hr tablet 2 tabs po daily with supper 180 tablet 3   metoprolol tartrate (LOPRESSOR) 25 MG tablet Take 1 tablet (25 mg total) by mouth as needed (racing heart beat). Take as needed for racing heart beat 60 tablet 1   potassium chloride SA (KLOR-CON M20) 20 MEQ tablet Take 2 tablets (40 mEq total) by mouth 2 (two) times daily. 360 tablet 3   sildenafil (VIAGRA) 100 MG tablet 1/2-1 tab po qd  as needed.  Take 30-60 min prior to intercourse 15 tablet 6   budesonide-formoterol (SYMBICORT) 160-4.5 MCG/ACT inhaler Inhale 2 puffs into the lungs 2 (two) times daily. 1 each 1   No current facility-administered medications for this visit.    Allergies:   Patient has no known allergies.   Social History:  The patient  reports that he has never smoked. He has never used smokeless tobacco. He reports current alcohol use of about 3.0 standard drinks of alcohol per week. He reports that he does not use drugs.   Family History:  The patient's family history includes Diabetes in his father; Heart attack in his mother; Stroke in his father and another family member.    ROS:  Please see the history of present illness.   Otherwise, review of systems is positive for none.   All other systems are reviewed and negative.    PHYSICAL EXAM: VS:  BP 124/78   Pulse 66   Ht '5\' 6"'$  (1.676 m)   Wt 194 lb (88 kg)   SpO2 99%   BMI 31.31 kg/m  , BMI Body mass index is 31.31 kg/m. GEN: Well nourished, well developed, in no acute distress  HEENT: normal  Neck: no JVD, carotid bruits, or masses Cardiac: RRR; no murmurs, rubs, or gallops,no edema  Respiratory:  clear to auscultation bilaterally, normal work of breathing GI: soft, nontender, nondistended, + BS MS: no deformity or atrophy  Skin: warm and dry Neuro:  Strength and sensation are intact Psych: euthymic mood, full affect  EKG:  EKG is ordered today. Personal review of the ekg ordered shows sinus rhythm, PVC  Recent Labs: 12/22/2021: ALT 22; Magnesium 1.6; TSH 3.35 03/26/2022: BUN 16; Creatinine, Ser 0.90; Hemoglobin 12.5; Platelets 160.0; Potassium 3.9; Sodium 135    Lipid Panel     Component Value Date/Time   CHOL 88 12/22/2021 0931   TRIG 85.0 12/22/2021 0931   HDL 37.40 (L) 12/22/2021 0931   CHOLHDL 2 12/22/2021 0931   VLDL 17.0 12/22/2021 0931   LDLCALC 33 12/22/2021 0931     Wt Readings from Last 3 Encounters:  05/09/22  194 lb (88 kg)  04/16/22 189 lb (85.7 kg)  03/26/22 189 lb 9.6 oz (86 kg)      Other studies Reviewed: Additional studies/ records that were reviewed today include: TTE 01/17/22  Review of the above records today demonstrates:   1. Left ventricular ejection fraction, by estimation, is 55 to 60%. The  left ventricle has normal function. The left ventricle has no regional  wall motion abnormalities. Left ventricular diastolic parameters are  consistent with Grade I diastolic  dysfunction (impaired relaxation).   2. Right ventricular systolic function is normal. The right ventricular  size is normal. There is normal pulmonary artery systolic pressure. The  estimated right ventricular systolic pressure is 0000000 mmHg.   3. Left atrial size was  mildly dilated.   4. The mitral valve is grossly normal. Trivial mitral valve  regurgitation.   5. The aortic valve is tricuspid. Aortic valve regurgitation is not  visualized.   6. The inferior vena cava is dilated in size with >50% respiratory  variability, suggesting right atrial pressure of 8 mmHg.   Myoview 04/16/2022   Resting ECG shows non specific ST abnormality in inferior leads   A pharmacological stress test was performed using IV Lexiscan 0.'4mg'$  over 10 seconds performed without concurrent submaximal exercise. The patient reported dyspnea during the stress test. Normal blood pressure and normal heart rate response noted during stress. Heart rate recovery was normal.   No ST deviation from baseline was noted. Arrhythmias during stress: rare PVCs. Arrhythmias during recovery: rare PVCs. ECG was interpretable and without significant changes. The ECG was not diagnostic due to pharmacologic protocol.   There is no evidence of ischemia. There is no evidence of infarction.   Left ventricular function is normal. Nuclear stress EF: 50 %. The left ventricular ejection fraction is mildly decreased (45-54%). End diastolic cavity size is normal. End  systolic cavity size is normal. No evidence of transient ischemic dilation (TID) noted.   The study is normal. The study is low risk.   Prior study available for comparison from 03/13/2013.   ASSESSMENT AND PLAN:  1.  Paroxysmal atrial fibrillation: Currently on Eliquis.  CHA2DS2-VASc of 4.  He is having minimal symptoms from his atrial fibrillation.  It also appears that his atrial fibrillation episodes are quite rare.  As he is feeling well, he would prefer to avoid rhythm control for now.  If A-fib symptoms do return, he would benefit from ablation.  2.  Coronary artery disease: Recent Myoview without ischemia.  Plan per primary cardiology.  3.  Hypertension: Currently well-controlled  4.  Secondary to coagula state: Currently on Eliquis for atrial fibrillation    Current medicines are reviewed at length with the patient today.   The patient does not have concerns regarding his medicines.  The following changes were made today:  none  Labs/ tests ordered today include:  Orders Placed This Encounter  Procedures   EKG 12-Lead     Disposition:   FU with Shamela Haydon 1 year  Signed, Galina Haddox Meredith Leeds, MD  05/09/2022 10:32 AM     [facility] [address] [place] [address] [phone] (office) [phone] (fax)

## 2022-05-09 NOTE — Patient Instructions (Signed)
Medication Instructions:  °Your physician recommends that you continue on your current medications as directed. Please refer to the Current Medication list given to you today. ° °*If you need a refill on your cardiac medications before your next appointment, please call your pharmacy* ° ° °Lab Work: °None ordered ° ° °Testing/Procedures: °None ordered ° ° °Follow-Up: °At [facility], you and your health needs are our priority.  As part of our continuing mission to provide you with exceptional heart care, we have created designated Provider Care Teams.  These Care Teams include your primary Cardiologist (physician) and Advanced Practice Providers (APPs -  Physician Assistants and Nurse Practitioners) who all work together to provide you with the care you need, when you need it. ° °Your next appointment:   °1 year(s) ° °The format for your next appointment:   °In Person ° °Provider:   °Will Camnitz, MD ° ° ° °Thank you for choosing [facility]!! ° ° °Londyn Hotard, RN °[phone] ° ° ° °

## 2022-05-09 NOTE — Progress Notes (Signed)
Cardiology Office Note:    Date:  05/09/2022   ID:  Michael Hodge, DOB 09/18/1950, MRN [id number]  PCP:  Tammi Sou, MD   [facility] Cardiologist:  Candee Furbish, MD Electrophysiologist:  Will Meredith Leeds, MD     Referring MD: Tammi Sou, MD     History of Present Illness:    Michael Hodge is a 72 y.o. male here for the follow-up of paroxysmal atrial fibrillation with a hx of CAD, HTN, HLD, and DM.  He was seen by his PCP Dr. Anitra Lauth on 12/22/21 and had an EKG done due to complaints of 2 episodes of palpitations with an accompanying indigestion sensation in his upper chest. He was found to be in A-flutter/fibrillation at that visit. Although patient was asymptomatic with rates between 75-80bpm. He was started on Eliquis '5mg'$  BID and Lopressor '25mg'$  BID at that time.  Episodes of palpitations for 5-10 minutes or less. He has not had any recurrence of these symptomatic episodes of A-fib.  He denies any family history of A-fib. He states that he rarely snores and denies any witnessed apneic episodes.  He does not drink coffee but he does drink diet cokes a few times a month. He denies any other caffeine intake.  He denies any thyroid problems.  He socially drinks alcohol but denies any alcohol use for several days prior to the onset of his A-fib.  Patient does have HTN but this has been well controlled under 120/80 for the most part.  He denies any bleeding issues with the Eliquis.  The patient denies chest pain, chest pressure, dyspnea at rest or with exertion, PND, orthopnea, or leg swelling. Denies cough, fever, chills, nausea, or vomiting. Denies syncope, presyncope, or snoring. Denies dizziness or lightheadedness.  Overall doing quite well.  Awaiting knee surgery.  Right knee.  We discussed knee replacement.  It looks like he is on schedule to have it done on 05/29/2022 with Dr. Edmonia Lynch.  Had prior vacation [place], bird hunting in  [place].  Also enjoys fly fishing with his son.   Past Medical History:  Diagnosis Date   Atrial fibrillation ([place])    11/2021   CAD (coronary artery disease)    CT demonstrated nonobstructive plaques (this test was prompted by borderline ETT.   Colitis 07/2020   Went to ER in [place], [place], resolved with Augmentin   Diabetes mellitus without complication ([place]) dx'd [id number]   Fasting gluc 136 and HbA1c 6.7% at diagnosis.    Diverticulosis    GERD (gastroesophageal reflux disease)    occ   Hepatic steatosis 07/2020   noted on CT abd/pelv done for GE illness   History of colitis 08/06/2020   presumed infectious. CT abd/pelv w/contrast at [facility] med [facility]->long segment circumferential wall thickening of descending colon   History of nonmelanoma skin cancer    BCC   Hx of adenomatous colonic polyps    Hyperlipidemia    Hypertension    w/hypertensive retinopathy   Internal hemorrhoids    Obesity, Class II, BMI 35-39.9    Osteoarthritis of both knees    mod to severe 2022, steroid injections by ortho helped some.  Visco injections on the right --no significant help.    Past Surgical History:  Procedure Laterality Date   CARDIOVASCULAR STRESS TEST  02/2013   2015 LexiScan/low-level exercise-Myoview (02/2013): No ischemia, EF 53%, occ symptomatic PVCs.  03/2022 NORMAL/LOW RISK   COLONOSCOPY  02/2021   2023 normal.  Recall 5 yrs   COLONOSCOPY W/ POLYPECTOMY  2005;08/21/10;11/29/15   adenomatous; also mild diverticulosis; recall 2017.  iFOB neg 09/2011.  11/2015: tubular adenoma x 1. 02/2021 normal->recall 23yr   HAMMER TOE SURGERY Right 05/24/2020   remvoal sebaceous cyst  15-20 years ago   TONSILLECTOMY     TRANSTHORACIC ECHOCARDIOGRAM     12/2021 EF 55-60%, grd I DD, mild LAE    Current Medications: Current Meds  Medication Sig   apixaban (ELIQUIS) 5 MG TABS tablet Take 1 tablet (5 mg total) by mouth 2 (two) times daily.   atorvastatin (LIPITOR) 40 MG tablet Take 1 tablet (40 mg  total) by mouth daily.   diclofenac Sodium (VOLTAREN) 1 % GEL Apply 4 g topically 4 (four) times daily.   glucose blood (FREESTYLE LITE) test strip USE TO CHECK BLOOD SUGAR 1-2 TIMES DAILY AS INSTRUCTED.   hydrochlorothiazide (HYDRODIURIL) 25 MG tablet Take 1 tablet (25 mg total) by mouth daily.   metFORMIN (GLUCOPHAGE XR) 500 MG 24 hr tablet 2 tabs po daily with supper   metoprolol tartrate (LOPRESSOR) 25 MG tablet Take 1 tablet (25 mg total) by mouth as needed (racing heart beat). Take as needed for racing heart beat   potassium chloride SA (KLOR-CON M20) 20 MEQ tablet Take 2 tablets (40 mEq total) by mouth 2 (two) times daily.   sildenafil (VIAGRA) 100 MG tablet 1/2-1 tab po qd as needed.  Take 30-60 min prior to intercourse     Allergies:   Patient has no known allergies.   Social History   Socioeconomic History   Marital status: Married    Spouse name: Not on file   Number of children: Not on file   Years of education: Not on file   Highest education level: Bachelor's degree (e.g., BA, AB, BS)  Occupational History   Not on file  Tobacco Use   Smoking status: Never   Smokeless tobacco: Never  Vaping Use   Vaping Use: Never used  Substance and Sexual Activity   Alcohol use: Yes    Alcohol/week: 3.0 standard drinks of alcohol    Types: 3 Shots of liquor per week    Comment: occ   Drug use: No   Sexual activity: Yes  Other Topics Concern   Not on file  Social History Narrative   Married x 40 yrs.   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   Social Determinants of Health   Financial Resource Strain: Low Risk  (03/04/2022)   Overall Financial Resource Strain (CARDIA)    Difficulty of Paying Living Expenses: Not hard at all  Food Insecurity: No Food Insecurity (03/04/2022)   Hunger Vital Sign    Worried About Running Out of Food in the Last Year: Never true    Ran Out of Food in the Last Year: Never true  Transportation Needs: No  Transportation Needs (03/04/2022)   PRAPARE - THydrologist(Medical): No    Lack of Transportation (Non-Medical): No  Physical Activity: Sufficiently Active (03/04/2022)   Exercise Vital Sign    Days of Exercise per Week: 4 days    Minutes of Exercise per Session: 50 min  Stress: No Stress Concern Present (03/04/2022)   [place]   Feeling of Stress : Only a little  Social Connections: Socially Integrated (03/04/2022)   Social Connection and Isolation Panel [NHANES]  Frequency of Communication with Friends and Family: More than three times a week    Frequency of Social Gatherings with Friends and Family: More than three times a week    Attends Religious Services: More than 4 times per year    Active Member of [employer] or Organizations: Yes    Attends Music therapist: More than 4 times per year    Marital Status: Married     Family History: The patient's family history includes Diabetes in his father; Heart attack in his mother; Stroke in his father and another family member. There is no history of Colon cancer, Colon polyps, Rectal cancer, Stomach cancer, or Esophageal cancer.  ROS:   Please see the history of present illness.    All other systems reviewed and are negative.  EKGs/Labs/Other Studies Reviewed:    The following studies were reviewed today:  Cardiac Studies & Procedures     STRESS TESTS  MYOCARDIAL PERFUSION IMAGING 04/16/2022  Narrative   Resting ECG shows non specific ST abnormality in inferior leads   A pharmacological stress test was performed using IV Lexiscan 0.'4mg'$  over 10 seconds performed without concurrent submaximal exercise. The patient reported dyspnea during the stress test. Normal blood pressure and normal heart rate response noted during stress. Heart rate recovery was normal.   No ST deviation from baseline was noted. Arrhythmias during stress: rare  PVCs. Arrhythmias during recovery: rare PVCs. ECG was interpretable and without significant changes. The ECG was not diagnostic due to pharmacologic protocol.   There is no evidence of ischemia. There is no evidence of infarction.   Left ventricular function is normal. Nuclear stress EF: 50 %. The left ventricular ejection fraction is mildly decreased (45-54%). End diastolic cavity size is normal. End systolic cavity size is normal. No evidence of transient ischemic dilation (TID) noted.   The study is normal. The study is low risk.   Prior study available for comparison from 03/13/2013.   ECHOCARDIOGRAM  ECHOCARDIOGRAM COMPLETE 01/17/2022  Narrative ECHOCARDIOGRAM REPORT    Patient Name:   Michael Hodge Date of Exam: 01/17/2022 Medical Rec #:  [id number]     Height:       66.0 in Accession #:    [id number]    Weight:       201.0 lb Date of Birth:  [birth year]    BSA:          2.004 m Patient Age:    53 years      BP:           120/70 mmHg Patient Gender: M             HR:           55 bpm. Exam Location:  [place]  Procedure: 2D Echo, Cardiac Doppler, Color Doppler and 3D Echo  Indications:    Atrial Fibrillation I48.91  History:        Patient has no prior history of Echocardiogram examinations. CAD; Risk Factors:Diabetes, Hypertension and Dyslipidemia.  Sonographer:    Mikki Santee RDCS Referring Phys: [id number] [place]   1. Left ventricular ejection fraction, by estimation, is 55 to 60%. The left ventricle has normal function. The left ventricle has no regional wall motion abnormalities. Left ventricular diastolic parameters are consistent with Grade I diastolic dysfunction (impaired relaxation). 2. Right ventricular systolic function is normal. The right ventricular size is normal. There is normal pulmonary artery systolic pressure. The estimated right ventricular systolic pressure is  25.6 mmHg. 3. Left atrial size was mildly dilated. 4. The mitral  valve is grossly normal. Trivial mitral valve regurgitation. 5. The aortic valve is tricuspid. Aortic valve regurgitation is not visualized. 6. The inferior vena cava is dilated in size with >50% respiratory variability, suggesting right atrial pressure of 8 mmHg.  Comparison(s): No prior Echocardiogram.  FINDINGS Left Ventricle: Left ventricular ejection fraction, by estimation, is 55 to 60%. The left ventricle has normal function. The left ventricle has no regional wall motion abnormalities. The left ventricular internal cavity size was normal in size. There is no left ventricular hypertrophy. Left ventricular diastolic parameters are consistent with Grade I diastolic dysfunction (impaired relaxation). Indeterminate filling pressures.  Right Ventricle: The right ventricular size is normal. No increase in right ventricular wall thickness. Right ventricular systolic function is normal. There is normal pulmonary artery systolic pressure. The tricuspid regurgitant velocity is 2.10 m/s, and with an assumed right atrial pressure of 8 mmHg, the estimated right ventricular systolic pressure is 0000000 mmHg.  Left Atrium: Left atrial size was mildly dilated.  Right Atrium: Right atrial size was normal in size.  Pericardium: There is no evidence of pericardial effusion.  Mitral Valve: The mitral valve is grossly normal. Trivial mitral valve regurgitation.  Tricuspid Valve: The tricuspid valve is grossly normal. Tricuspid valve regurgitation is trivial.  Aortic Valve: The aortic valve is tricuspid. Aortic valve regurgitation is not visualized.  Pulmonic Valve: The pulmonic valve was normal in structure. Pulmonic valve regurgitation is not visualized.  Aorta: The aortic root and ascending aorta are structurally normal, with no evidence of dilitation.  Venous: The inferior vena cava is dilated in size with greater than 50% respiratory variability, suggesting right atrial pressure of 8  mmHg.  IAS/Shunts: No atrial level shunt detected by color flow Doppler.   LEFT VENTRICLE PLAX 2D LVIDd:         5.70 cm   Diastology LVIDs:         4.20 cm   LV e' medial:    5.33 cm/s LV PW:         0.90 cm   LV E/e' medial:  14.9 LV IVS:        0.90 cm   LV e' lateral:   9.36 cm/s LVOT diam:     2.30 cm   LV E/e' lateral: 8.5 LV SV:         84 LV SV Index:   42 LVOT Area:     4.15 cm  3D Volume EF: 3D EF:        52 % LV EDV:       169 ml LV ESV:       82 ml LV SV:        87 ml  RIGHT VENTRICLE RV Basal diam:  4.55 cm RV Mid diam:    3.00 cm RV S prime:     13.55 cm/s TAPSE (M-mode): 2.0 cm  LEFT ATRIUM             Index        RIGHT ATRIUM           Index LA diam:        4.40 cm 2.20 cm/m   RA Area:     21.40 cm LA Vol (A2C):   92.1 ml 45.96 ml/m  RA Volume:   62.00 ml  30.94 ml/m LA Vol (A4C):   49.5 ml 24.70 ml/m LA Biplane Vol: 72.5 ml 36.18 ml/m AORTIC  VALVE LVOT Vmax:   87.05 cm/s LVOT Vmean:  56.875 cm/s LVOT VTI:    0.201 m  AORTA Ao Root diam: 3.70 cm Ao Asc diam:  3.60 cm  MITRAL VALVE               TRICUSPID VALVE MV Area (PHT): 3.14 cm    TR Peak grad:   17.6 mmHg MV Decel Time: 242 msec    TR Vmax:        210.00 cm/s MV E velocity: 79.30 cm/s MV A velocity: 72.55 cm/s  SHUNTS MV E/A ratio:  1.09        Systemic VTI:  0.20 m Systemic Diam: 2.30 cm  Lyman Bishop MD Electronically signed by Lyman Bishop MD Signature Date/Time: 01/17/2022/11:24:57 AM    Final     CT SCANS  CT CORONARY MORPH W/CTA COR W/SCORE 08/17/2010  Narrative OVER-READ INTERPRETATION - CT CHEST  The following report is an over-read performed by radiologist Dr. Doristine Church. Ardeen [facility], M.D. of [facility], [place] on 08/17/2010 17:19:52.  This over-read does not include interpretation of cardiac or coronary anatomy or pathology.  The CTA interpretation by the cardiologist is attached.  Comparison:  CT abdomen 03/03/2005  Findings: Minimal ground-glass densities  dependently in the lungs, likely atelectasis.  There are no pleural effusions.  Visualized aorta is normal caliber.  No adenopathy in the visualized mediastinum or hila.  Imaging into the upper abdomen shows no acute findings.  No acute bony abnormality.  IMPRESSION: No significant extracardiac abnormality.  Cardiac CT:  Indication: Abnormal ETT and dyspnea  Protocol:  The patient was scanned on a Philips 256 scanner. Collimation was .58m and gantry rotation speed 3032mc.  The patient received '10mg'$  of iv lopresser and 2 sl nitro.  Average HR during scan was 54 bpm.  A prospectively triggered scan was done centered around 78% of the R-R interval.  The patient received 80cc of contrast.  The 3D data set was sent to a Philips work station for review using MPR, MIP and VRT modes  Findings:  Calcium score  44.5 with discrete foci in the ostial RCA and proximal LAD at the take off of D1  Coronary CTA:  Right dominant with no anomaly.  LM- less than 20% calcific disease, LAD- less than 30% mixed plaque in the proximal segment, normal mid and distal.  D1-large vessel with less than 30% calcific stenosis ostially., D2-normal, D3-small and normal. Cirucmflex-normal, OM1, OM2- normal, RCA - dominant and normal.  Noncardiac: Soft tissue and lung windows reviewed with no significant findings.  [facility]  Impression:  1)    Calcium score 44.5  2)    Right dominant coronary arteries without significant stenosis.  See narrative above.  ASA and LDL goal under 100 given mixed plaque in proximal LAD and calcium score Copy to Dr HoPercival Spanishnd ScRichardson DoppA  Original Report Authenticated By: [id number]         CT calcium score 08/17/2010: Findings:  Calcium score  44.5 with discrete foci in the ostial RCA and  proximal LAD at the take off of D1  Coronary CTA:  Right dominant with no anomaly.  LM- less than 20%  calcific disease, LAD- less than 30%  mixed plaque in the proximal  segment, normal mid and distal.  D1-large vessel with less than 30%  calcific stenosis ostially., D2-normal, D3-small and normal.  Cirucmflex-normal, OM1, OM2- normal, RCA - dominant and normal.  Noncardiac:  Soft tissue and lung windows reviewed with no  significant findings.  See separate report from [facility]  Impression:  1)    Calcium score 44.5  2)    Right dominant coronary arteries without significant  stenosis.  See narrative above.  ASA and LDL goal under 100 given  mixed plaque in proximal LAD and calcium score    EKG:  EKG was ordered today. EKG done today was personally reviewed and demonstrates sinus bradycardia 49 12/22/21: A-fib rate of 90bpm.  Recent Labs: 12/22/2021: ALT 22; Magnesium 1.6; TSH 3.35 03/26/2022: BUN 16; Creatinine, Ser 0.90; Hemoglobin 12.5; Platelets 160.0; Potassium 3.9; Sodium 135  Recent Lipid Panel    Component Value Date/Time   CHOL 88 12/22/2021 0931   TRIG 85.0 12/22/2021 0931   HDL 37.40 (L) 12/22/2021 0931   CHOLHDL 2 12/22/2021 0931   VLDL 17.0 12/22/2021 0931   LDLCALC 33 12/22/2021 0931     Risk Assessment/Calculations:     CHA2DS2-VASc Score = 4   This indicates a 4.8% annual risk of stroke. The patient's score is based upon: CHF History: 0 HTN History: 1 Diabetes History: 1 Stroke History: 0 Vascular Disease History: 1 Age Score: 1 Gender Score: 0               Physical Exam:    VS:  BP 124/78 (BP Location: Left Arm, Patient Position: Sitting, Cuff Size: Normal)   Pulse 60   Ht '5\' 6"'$  (1.676 m)   Wt 194 lb (88 kg)   SpO2 97%   BMI 31.31 kg/m     Wt Readings from Last 3 Encounters:  05/09/22 194 lb (88 kg)  05/09/22 194 lb (88 kg)  04/16/22 189 lb (85.7 kg)    GEN: Well nourished, well developed, in no acute distress HEENT: normal Neck: no JVD, carotid bruits, or masses Cardiac: RRR; no murmurs, rubs, or gallops,no edema  Respiratory:  clear to auscultation  bilaterally, normal work of breathing GI: soft, nontender, nondistended, + BS MS: no deformity or atrophy Skin: warm and dry, no rash Neuro:  Alert and Oriented x 3, Strength and sensation are intact Psych: euthymic mood, full affect   ASSESSMENT:    1. Paroxysmal atrial fibrillation (HCC)   2. Coronary artery disease involving native coronary artery of native heart without angina pectoris   3. Dyslipidemia     PLAN:    In order of problems listed above:  Paroxysmal atrial fibrillation - TSH is normal electrolytes were normal, modest alcohol.  No significant snoring.  Discussed with him that age is are biggest risk factor.  Weight as well. - On Eliquis 5 mg twice a day. - He has metoprolol 25 mg twice a day as well.  Comfortable with him continuing that for now. - We discussed potential options down the road such as ablation.  Appreciating him sitting down with Dr. Curt Bears to discuss ablation options.  At this point, he would like to hold off.  This seems reasonable.  Infrequent episodes.  Obviously if this increases we can always consider.  He asked previously about cardioversion.  If he is in normal rhythm he does not need cardioversion.  Went over the physiology behind atrial fibrillation and the potential for stroke.  He seemed symptomatic at one point with his atrial fibrillation.  As needed metoprolol.  Preop cardiovascular risk   He may proceed with knee replacement, with Dr. Percell Miller.  He will be of low cardiac risk.  He would not  need a Lovenox bridge.  Of course we may see perioperative atrial fibrillation.  He has as needed metoprolol.  Try to avoid excessive caffeine.  He knows to reach out if he has further worsening episodes.  Coronary artery disease - Previously diagnosed with nonobstructive calcified plaque, 2012 CT as above.  Currently on atorvastatin 40 mg.  Excellent.  His LDL is 33 wonderful.  At goal.       Follow up: 1 year  Medication Adjustments/Labs and  Tests Ordered: Current medicines are reviewed at length with the patient today.  Concerns regarding medicines are outlined above.  No orders of the defined types were placed in this encounter.  No orders of the defined types were placed in this encounter.   Patient Instructions  Medication Instructions:  Your physician recommends that you continue on your current medications as directed. Please refer to the Current Medication list given to you today.  *If you need a refill on your cardiac medications before your next appointment, please call your pharmacy*   Follow-Up: At [facility], you and your health needs are our priority.  As part of our continuing mission to provide you with exceptional heart care, we have created designated Provider Care Teams.  These Care Teams include your primary Cardiologist (physician) and Advanced Practice Providers (APPs -  Physician Assistants and Nurse Practitioners) who all work together to provide you with the care you need, when you need it.   Your next appointment:   1 year(s)  Provider:   Candee Furbish, MD      ntation on 05/09/22 for the exam, diagnosis, procedures, and orders are all accurate and complete.   Signed, Candee Furbish, MD  05/09/2022 12:22 PM    [place]

## 2022-05-09 NOTE — Patient Instructions (Signed)
Michael Hodge , Thank you for taking time to come for your Medicare Wellness Visit. I appreciate your ongoing commitment to your health goals. Please review the following plan we discussed and let me know if I can assist you in the future.   These are the goals we discussed:  Goals      Patient Stated     Lose a few pounds     Patient Stated     Drop a few pounds      Patient Stated     Get knee replaced         This is a list of the screening recommended for you and due dates:  Health Maintenance  Topic Date Due   COVID-19 Vaccine (6 - 2023-24 season) 10/27/2021   Yearly kidney health urinalysis for diabetes  06/23/2022   Complete foot exam   06/23/2022   Hemoglobin A1C  06/23/2022   Eye exam for diabetics  03/15/2023   Yearly kidney function blood test for diabetes  03/27/2023   Medicare Annual Wellness Visit  05/09/2023   Colon Cancer Screening  03/13/2026   DTaP/Tdap/Td vaccine (5 - Td or Tdap) 08/20/2030   Pneumonia Vaccine  Completed   Flu Shot  Completed   Hepatitis C Screening: USPSTF Recommendation to screen - Ages 84-79 yo.  Completed   Zoster (Shingles) Vaccine  Completed   HPV Vaccine  Aged Out    Advanced directives: Please bring a copy of your health care power of attorney and living will to the office at your convenience.  Conditions/risks identified: get knee replacement  Next appointment: Follow up in one year for your annual wellness visit.   Preventive Care 62 Years and Older, Male  Preventive care refers to lifestyle choices and visits with your health care provider that can promote health and wellness. What does preventive care include? A yearly physical exam. This is also called an annual well check. Dental exams once or twice a year. Routine eye exams. Ask your health care provider how often you should have your eyes checked. Personal lifestyle choices, including: Daily care of your teeth and gums. Regular physical activity. Eating a healthy  diet. Avoiding tobacco and drug use. Limiting alcohol use. Practicing safe sex. Taking low doses of aspirin every day. Taking vitamin and mineral supplements as recommended by your health care provider. What happens during an annual well check? The services and screenings done by your health care provider during your annual well check will depend on your age, overall health, lifestyle risk factors, and family history of disease. Counseling  Your health care provider may ask you questions about your: Alcohol use. Tobacco use. Drug use. Emotional well-being. Home and relationship well-being. Sexual activity. Eating habits. History of falls. Memory and ability to understand (cognition). Work and work Statistician. Screening  You may have the following tests or measurements: Height, weight, and BMI. Blood pressure. Lipid and cholesterol levels. These may be checked every 5 years, or more frequently if you are over 76 years old. Skin check. Lung cancer screening. You may have this screening every year starting at age 12 if you have a 30-pack-year history of smoking and currently smoke or have quit within the past 15 years. Fecal occult blood test (FOBT) of the stool. You may have this test every year starting at age 8. Flexible sigmoidoscopy or colonoscopy. You may have a sigmoidoscopy every 5 years or a colonoscopy every 10 years starting at age 26. Prostate cancer screening. Recommendations will  vary depending on your family history and other risks. Hepatitis C blood test. Hepatitis B blood test. Sexually transmitted disease (STD) testing. Diabetes screening. This is done by checking your blood sugar (glucose) after you have not eaten for a while (fasting). You may have this done every 1-3 years. Abdominal aortic aneurysm (AAA) screening. You may need this if you are a current or former smoker. Osteoporosis. You may be screened starting at age 46 if you are at high risk. Talk with  your health care provider about your test results, treatment options, and if necessary, the need for more tests. Vaccines  Your health care provider may recommend certain vaccines, such as: Influenza vaccine. This is recommended every year. Tetanus, diphtheria, and acellular pertussis (Tdap, Td) vaccine. You may need a Td booster every 10 years. Zoster vaccine. You may need this after age 75. Pneumococcal 13-valent conjugate (PCV13) vaccine. One dose is recommended after age 12. Pneumococcal polysaccharide (PPSV23) vaccine. One dose is recommended after age 35. Talk to your health care provider about which screenings and vaccines you need and how often you need them. This information is not intended to replace advice given to you by your health care provider. Make sure you discuss any questions you have with your health care provider. Document Released: 03/11/2015 Document Revised: 11/02/2015 Document Reviewed: 12/14/2014 Elsevier Interactive Patient Education  2017 [place] Prevention in the Home Falls can cause injuries. They can happen to people of all ages. There are many things you can do to make your home safe and to help prevent falls. What can I do on the outside of my home? Regularly fix the edges of walkways and driveways and fix any cracks. Remove anything that might make you trip as you walk through a door, such as a raised step or threshold. Trim any bushes or trees on the path to your home. Use bright outdoor lighting. Clear any walking paths of anything that might make someone trip, such as rocks or tools. Regularly check to see if handrails are loose or broken. Make sure that both sides of any steps have handrails. Any raised decks and porches should have guardrails on the edges. Have any leaves, snow, or ice cleared regularly. Use sand or salt on walking paths during winter. Clean up any spills in your garage right away. This includes oil or grease spills. What  can I do in the bathroom? Use night lights. Install grab bars by the toilet and in the tub and shower. Do not use towel bars as grab bars. Use non-skid mats or decals in the tub or shower. If you need to sit down in the shower, use a plastic, non-slip stool. Keep the floor dry. Clean up any water that spills on the floor as soon as it happens. Remove soap buildup in the tub or shower regularly. Attach bath mats securely with double-sided non-slip rug tape. Do not have throw rugs and other things on the floor that can make you trip. What can I do in the bedroom? Use night lights. Make sure that you have a light by your bed that is easy to reach. Do not use any sheets or blankets that are too big for your bed. They should not hang down onto the floor. Have a firm chair that has side arms. You can use this for support while you get dressed. Do not have throw rugs and other things on the floor that can make you trip. What can I do in  the kitchen? Clean up any spills right away. Avoid walking on wet floors. Keep items that you use a lot in easy-to-reach places. If you need to reach something above you, use a strong step stool that has a grab bar. Keep electrical cords out of the way. Do not use floor polish or wax that makes floors slippery. If you must use wax, use non-skid floor wax. Do not have throw rugs and other things on the floor that can make you trip. What can I do with my stairs? Do not leave any items on the stairs. Make sure that there are handrails on both sides of the stairs and use them. Fix handrails that are broken or loose. Make sure that handrails are as long as the stairways. Check any carpeting to make sure that it is firmly attached to the stairs. Fix any carpet that is loose or worn. Avoid having throw rugs at the top or bottom of the stairs. If you do have throw rugs, attach them to the floor with carpet tape. Make sure that you have a light switch at the top of the  stairs and the bottom of the stairs. If you do not have them, ask someone to add them for you. What else can I do to help prevent falls? Wear shoes that: Do not have high heels. Have rubber bottoms. Are comfortable and fit you well. Are closed at the toe. Do not wear sandals. If you use a stepladder: Make sure that it is fully opened. Do not climb a closed stepladder. Make sure that both sides of the stepladder are locked into place. Ask someone to hold it for you, if possible. Clearly mark and make sure that you can see: Any grab bars or handrails. First and last steps. Where the edge of each step is. Use tools that help you move around (mobility aids) if they are needed. These include: Canes. Walkers. Scooters. Crutches. Turn on the lights when you go into a dark area. Replace any light bulbs as soon as they burn out. Set up your furniture so you have a clear path. Avoid moving your furniture around. If any of your floors are uneven, fix them. If there are any pets around you, be aware of where they are. Review your medicines with your doctor. Some medicines can make you feel dizzy. This can increase your chance of falling. Ask your doctor what other things that you can do to help prevent falls. This information is not intended to replace advice given to you by your health care provider. Make sure you discuss any questions you have with your health care provider. Document Released: 12/09/2008 Document Revised: 07/21/2015 Document Reviewed: 03/19/2014 Elsevier Interactive Patient Education  2017 [employer].

## 2022-05-09 NOTE — Patient Instructions (Signed)
Medication Instructions:  Your physician recommends that you continue on your current medications as directed. Please refer to the Current Medication list given to you today.  *If you need a refill on your cardiac medications before your next appointment, please call your pharmacy*   Follow-Up: At [facility], you and your health needs are our priority.  As part of our continuing mission to provide you with exceptional heart care, we have created designated Provider Care Teams.  These Care Teams include your primary Cardiologist (physician) and Advanced Practice Providers (APPs -  Physician Assistants and Nurse Practitioners) who all work together to provide you with the care you need, when you need it.   Your next appointment:   1 year(s)  Provider:   Candee Furbish, MD

## 2022-05-09 NOTE — Progress Notes (Signed)
I connected with  Michael Hodge on 05/09/22 by a audio enabled telemedicine application and verified that I am speaking with the correct person using two identifiers.  Patient Location: Home  Provider Location: Home Office  I discussed the limitations of evaluation and management by telemedicine. The patient expressed understanding and agreed to proceed.   Subjective:   Michael Hodge is a 72 y.o. male who presents for Medicare Annual/Subsequent preventive examination.  Review of Systems     Cardiac Risk Factors include: advanced age (>76mn, >>69women);male gender;hypertension;diabetes mellitus     Objective:    Today's Vitals   05/09/22 1550  Weight: 185 lb (83.9 kg)   Body mass index is 29.86 kg/m.     05/09/2022    3:54 PM 04/26/2021    9:06 AM 04/13/2020    1:40 PM 11/30/2016    8:49 AM 11/07/2015    1:07 PM  Advanced Directives  Does Patient Have a Medical Advance Directive? Yes Yes Yes Yes No  Type of AParamed[place]of [place] will Healthcare Power of [place] will [place] will   Copy of [place] Chart? No - copy requested No - copy requested No - copy requested No - copy requested     Current Medications (verified) Outpatient Encounter Medications as of 05/09/2022  Medication Sig   apixaban (ELIQUIS) 5 MG TABS tablet Take 1 tablet (5 mg total) by mouth 2 (two) times daily.   atorvastatin (LIPITOR) 40 MG tablet Take 1 tablet (40 mg total) by mouth daily.   diclofenac Sodium (VOLTAREN) 1 % GEL Apply 4 g topically 4 (four) times daily.   glucose blood (FREESTYLE LITE) test strip USE TO CHECK BLOOD SUGAR 1-2 TIMES DAILY AS INSTRUCTED.   hydrochlorothiazide (HYDRODIURIL) 25 MG tablet Take 1 tablet (25 mg total) by mouth daily.   metFORMIN (GLUCOPHAGE XR) 500 MG 24 hr tablet 2 tabs po daily with supper   metoprolol tartrate (LOPRESSOR) 25 MG tablet Take 1 tablet  (25 mg total) by mouth as needed (racing heart beat). Take as needed for racing heart beat   potassium chloride SA (KLOR-CON M20) 20 MEQ tablet Take 2 tablets (40 mEq total) by mouth 2 (two) times daily.   sildenafil (VIAGRA) 100 MG tablet 1/2-1 tab po qd as needed.  Take 30-60 min prior to intercourse   [DISCONTINUED] budesonide-formoterol (SYMBICORT) 160-4.5 MCG/ACT inhaler Inhale 2 puffs into the lungs 2 (two) times daily. (Patient not taking: Reported on 05/09/2022)   No facility-administered encounter medications on file as of 05/09/2022.    Allergies (verified) Patient has no known allergies.   History: Past Medical History:  Diagnosis Date   Atrial fibrillation ([place]    11/2021   CAD (coronary artery disease)    CT demonstrated nonobstructive plaques (this test was prompted by borderline ETT.   Colitis 07/2020   [place]   Diabetes mellitus without complication ([place] dx'd [id number]  Fasting gluc 136 and HbA1c 6.7% at diagnosis.    Diverticulosis    GERD (gastroesophageal reflux disease)    occ   Hepatic steatosis 07/2020   noted on CT abd/pelv done for GE illness   History of colitis 08/06/2020   presumed infectious[facility] circumferential wall thickening of descending colon   History of nonmelanoma skin cancer    BCC   Hx of adenomatous colonic  polyps    Hyperlipidemia    Hypertension    w/hypertensive retinopathy   Internal hemorrhoids    Obesity, Class II, BMI 35-39.9    Osteoarthritis of both knees    mod to severe 2022, steroid injections by ortho helped some.  Visco injections on the right --no significant help.   Past Surgical History:  Procedure Laterality Date   CARDIOVASCULAR STRESS TEST  02/2013   2015 LexiScan/low-level exercise-Myoview (02/2013): No ischemia, EF 53%, occ symptomatic PVCs.  03/2022 NORMAL/LOW RISK   COLONOSCOPY  02/2021   2023 normal.  Recall 5 yrs    COLONOSCOPY W/ POLYPECTOMY  2005;08/21/10;11/29/15   adenomatous; also mild diverticulosis; recall 2017.  iFOB neg 09/2011.  11/2015: tubular adenoma x 1. 02/2021 normal->recall 21yr   HAMMER TOE SURGERY Right 05/24/2020   remvoal sebaceous cyst  15-20 years ago   TONSILLECTOMY     TRANSTHORACIC ECHOCARDIOGRAM     12/2021 EF 55-60%, grd I DD, mild LAE   Family History  Problem Relation Age of Onset   Heart attack Mother    Diabetes Father    Stroke Father    Stroke Other    Colon cancer Neg Hx    Colon polyps Neg Hx    Rectal cancer Neg Hx    Stomach cancer Neg Hx    Esophageal cancer Neg Hx    Social History   Socioeconomic History   Marital status: Married    Spouse name: Not on file   Number of children: Not on file   Years of education: Not on file   Highest education level: Bachelor's degree (e.g., BA, AB, BS)  Occupational History   Not on file  Tobacco Use   Smoking status: Never   Smokeless tobacco: Never  Vaping Use   Vaping Use: Never used  Substance and Sexual Activity   Alcohol use: Yes    Alcohol/week: 3.0 standard drinks of alcohol    Types: 3 Shots of liquor per week    Comment: occ   Drug use: No   Sexual activity: Yes  Other Topics Concern   Not on file  Social History Narrative   Married x 40 yrs.   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   No T/A/Ds.   Social Determinants of Health   Financial Resource Strain: Low Risk  (05/09/2022)   Overall Financial Resource Strain (CARDIA)    Difficulty of Paying Living Expenses: Not hard at all  Food Insecurity: No Food Insecurity (05/09/2022)   Hunger Vital Sign    Worried About Running Out of Food in the Last Year: Never true    Ran Out of Food in the Last Year: Never true  Transportation Needs: No Transportation Needs (05/09/2022)   PRAPARE - THydrologist(Medical): No    Lack of Transportation (Non-Medical): No  Physical Activity: Sufficiently  Active (05/09/2022)   Exercise Vital Sign    Days of Exercise per Week: 4 days    Minutes of Exercise per Session: 50 min  Stress: No Stress Concern Present (05/09/2022)   [place]   Feeling of Stress : Not at all  Social Connections: [place](05/09/2022)   Social Connection and Isolation Panel [NHANES]    Frequency of Communication with Friends and Family: More than three times a week    Frequency of Social Gatherings with Friends and Family: More than three times a week  Attends Religious Services: More than 4 times per year    Active Member of Clubs or Organizations: Yes    Attends Archivist Meetings: 1 to 4 times per year    Marital Status: Married    Tobacco Counseling Counseling given: Not Answered   Clinical Intake:  Pre-visit preparation completed: Yes  Pain : No/denies pain     BMI - recorded: 29.86 Nutritional Status: BMI > 30  Obese Nutritional Risks: None Diabetes: Yes CBG done?: No Did pt. bring in CBG monitor from home?: No  How often do you need to have someone help you when you read instructions, pamphlets, or other written materials from your doctor or pharmacy?: 1 - Never  Diabetic?Nutrition Risk Assessment:  Has the patient had any N/V/D within the last 2 months?  No  Does the patient have any non-healing wounds?  No  Has the patient had any unintentional weight loss or weight gain?  No   Diabetes:  Is the patient diabetic?  Yes  If diabetic, was a CBG obtained today?  No  Did the patient bring in their glucometer from home?  No  How often do you monitor your CBG's? Occasional .   Financial Strains and Diabetes Management:  Are you having any financial strains with the device, your supplies or your medication? No .  Does the patient want to be seen by Chronic Care Management for management of their diabetes?  No  Would the patient like to be referred to  a Nutritionist or for Diabetic Management?  No   Diabetic Exams:  Diabetic Eye Exam: Completed 03/14/22 Diabetic Foot Exam: Completed 06/22/21   Interpreter Needed?: No  Information entered by :: Charlott Rakes, LPN   Activities of Daily Living    05/09/2022    3:55 PM 03/04/2022    9:50 PM  In your present state of health, do you have any difficulty performing the following activities:  Hearing? 0 0  Vision? 0 0  Difficulty concentrating or making decisions? 0 0  Walking or climbing stairs? 0 1  Dressing or bathing? 0 0  Doing errands, shopping? 0 0  Preparing Food and eating ? N N  Using the Toilet? N N  In the past six months, have you accidently leaked urine? N N  Do you have problems with loss of bowel control? N N  Managing your Medications? N N  Managing your Finances? N N  Housekeeping or managing your Housekeeping? N N    Patient Care Team: Tammi Sou, MD as PCP - General (Family Medicine) [name], MD as PCP - Cardiology (Cardiology) Constance Haw, MD as PCP - Electrophysiology (Cardiology) Marica Otter, [place] as Consulting Physician (Optometry) Irene Shipper, MD as Consulting Physician (Gastroenterology) Minus Breeding, MD as Consulting Physician (Cardiology) Regal, Tamala Fothergill, DPM as Consulting Physician (Podiatry) Renette Butters, MD as Consulting Physician (Orthopedic Surgery) [name], MD as Consulting Physician (Cardiology)  Indicate any recent Medical Services you may have received from other than [facility] providers in the past year (date may be approximate).     Assessment:   This is a routine wellness examination for [place].  Hearing/Vision screen Hearing Screening - Comments:: Pt denies any hearing issues  Vision Screening - Comments:: Pt follows up with Dr Marica Otter for annual eye exams   Dietary issues and exercise activities discussed: Current Exercise Habits: Home exercise routine, Type of exercise: walking, Time  (Minutes): 50, Frequency (Times/Week): 4, Weekly Exercise (  Minutes/Week): 200   Goals Addressed             This Visit's Progress    Patient Stated       Get knee replaced        Depression Screen    05/09/2022    3:52 PM 06/22/2021   10:02 AM 04/26/2021    9:05 AM 12/22/2020   10:02 AM 04/13/2020    1:42 PM 10/23/2019    9:32 AM 08/26/2018   10:00 AM  PHQ 2/9 Scores  PHQ - 2 Score 0 0 0 0 0 0 0    Fall Risk    05/09/2022    3:55 PM 03/04/2022    9:50 PM 06/22/2021   10:02 AM 06/18/2021    2:23 PM 04/26/2021    9:07 AM  Fall Risk   Falls in the past year? 0 0 0 0 0  Number falls in past yr: 0 0 0  0  Injury with Fall? 0 0 0  0  Risk for fall due to : Impaired vision;Impaired mobility  Impaired vision  Impaired vision  Risk for fall due to: Comment related to knees      Follow up Falls prevention discussed  Falls evaluation completed  Falls prevention discussed    FALL RISK PREVENTION PERTAINING TO THE HOME:  Any stairs in or around the home? Yes  If so, are there any without handrails? No  Home free of loose throw rugs in walkways, pet beds, electrical cords, etc? Yes  Adequate lighting in your home to reduce risk of falls? Yes   ASSISTIVE DEVICES UTILIZED TO PREVENT FALLS:  Life alert? No  Use of a cane, walker or w/c? No  Grab bars in the bathroom? No  Shower chair or bench in shower? Yes  Elevated toilet seat or a handicapped toilet? No   TIMED UP AND GO:  Was the test performed? No .  Cognitive Function:        05/09/2022    3:56 PM 04/26/2021    9:11 AM  6CIT Screen  What Year? 0 points 0 points  What month? 0 points 0 points  What time? 0 points 0 points  Count back from 20 0 points 0 points  Months in reverse 0 points 0 points  Repeat phrase 0 points 0 points  Total Score 0 points 0 points    Immunizations Immunization History  Administered Date(s) Administered   DTaP 02/27/2007   Fluad Quad(high Dose 65+) 11/26/2018, 01/03/2021, 12/22/2021    Hepatitis B 02/27/2007   IPV 02/27/2007   Influenza Split 01/23/2011, 11/14/2012   Influenza, High Dose Seasonal PF 11/09/2015, 11/30/2016, 01/15/2018   Influenza,inj,Quad PF,6+ Mos 12/25/2013   Influenza-Unspecified 11/19/2014, 01/20/2020   PFIZER(Purple Top)SARS-COV-2 Vaccination 03/18/2019, 04/09/2019, 12/28/2019, 07/11/2020   Pfizer Covid-19 Vaccine Bivalent Booster 35yr & up 01/03/2021   Pneumococcal Conjugate-13 11/14/2012   Pneumococcal Polysaccharide-23 04/14/2015   Td 01/20/2008, 02/05/2018   Tdap 08/19/2020   Zoster Recombinat (Shingrix) 12/26/2016, 04/18/2017   Zoster, Live 04/12/2009    TDAP status: Up to date  Flu Vaccine status: Up to date  Pneumococcal vaccine status: Up to date  Covid-19 vaccine status: Completed vaccines  Qualifies for Shingles Vaccine? Yes   Zostavax completed Yes   Shingrix Completed?: Yes  Screening Tests Health Maintenance  Topic Date Due   COVID-19 Vaccine (6 - 2023-24 season) 10/27/2021   Diabetic kidney evaluation - Urine ACR  06/23/2022   FOOT EXAM  06/23/2022   HEMOGLOBIN  A1C  06/23/2022   OPHTHALMOLOGY EXAM  03/15/2023   Diabetic kidney evaluation - eGFR measurement  03/27/2023   Medicare Annual Wellness (AWV)  05/09/2023   COLONOSCOPY (Pts 45-67yr Insurance coverage will need to be confirmed)  03/13/2026   DTaP/Tdap/Td (5 - Td or Tdap) 08/20/2030   Pneumonia Vaccine 72 Years old  Completed   INFLUENZA VACCINE  Completed   Hepatitis C Screening  Completed   Zoster Vaccines- Shingrix  Completed   HPV VACCINES  Aged Out    Health Maintenance  Health Maintenance Due  Topic Date Due   COVID-19 Vaccine (6 - 2023-24 season) 10/27/2021    Colorectal cancer screening: Type of screening: Colonoscopy. Completed 03/13/21. Repeat every 5 years  Additional Screening:  Hepatitis C Screening: Completed 03/06/16  Vision Screening: Recommended annual ophthalmology exams for early detection of glaucoma and other disorders of the  eye. Is the patient up to date with their annual eye exam?  Yes  Who is the provider or what is the name of the office in which the patient attends annual eye exams? Dr sMarica Otter If pt is not established with a provider, would they like to be referred to a provider to establish care? No .   Dental Screening: Recommended annual dental exams for proper oral hygiene  Community Resource Referral / Chronic Care Management: CRR required this visit?  No   CCM required this visit?  No      Plan:     I have personally reviewed and noted the following in the patient's chart:   Medical and social history Use of alcohol, tobacco or illicit drugs  Current medications and supplements including opioid prescriptions. Patient is not currently taking opioid prescriptions. Functional ability and status Nutritional status Physical activity Advanced directives List of other physicians Hospitalizations, surgeries, and ER visits in previous 12 months Vitals Screenings to include cognitive, depression, and falls Referrals and appointments  In addition, I have reviewed and discussed with patient certain preventive protocols, quality metrics, and best practice recommendations. A written personalized care plan for preventive services as well as general preventive health recommendations were provided to patient.     TWillette Brace LPN   [id number]  Nurse Notes: none

## 2022-05-10 NOTE — Progress Notes (Signed)
Surgery orders requested via Epic inbox. °

## 2022-05-14 ENCOUNTER — Other Ambulatory Visit: Payer: Self-pay | Admitting: Family Medicine

## 2022-05-15 NOTE — H&P (Signed)
KNEE ARTHROPLASTY ADMISSION H&P  Patient ID: Michael Hodge MRN: [id number] DOB/AGE: 04/20/1950 72 y.o.  Chief Complaint: right knee pain.  Planned Procedure Date: 05/29/22 Medical Clearance by Dr. Anitra Lauth   Cardiac Clearance by Dr. Marlou Porch   HPI: Michael Hodge is a 72 y.o. male who presents for evaluation of OA RIGHT KNEE. The patient has a history of pain and functional disability in the right knee due to arthritis and has failed non-surgical conservative treatments for greater than 12 weeks to include NSAID's and/or analgesics, corticosteriod injections, viscosupplementation injections, use of assistive devices, and activity modification.  Onset of symptoms was gradual, starting >10 years ago with gradually worsening course since that time. The patient noted no past surgery on the right knee.  Patient currently rates pain at 5 out of 10 with activity. Patient has night pain, worsening of pain with activity and weight bearing, and pain that interferes with activities of daily living.  Patient has evidence of subchondral sclerosis, periarticular osteophytes, and joint space narrowing by imaging studies.  There is no active infection.  Past Medical History:  Diagnosis Date   Atrial fibrillation ([place])    11/2021   CAD (coronary artery disease)    CT demonstrated nonobstructive plaques (this test was prompted by borderline ETT.   Colitis 07/2020   Went to ER in [place], [place], resolved with Augmentin   Diabetes mellitus without complication ([place]) dx'd [id number]   Fasting gluc 136 and HbA1c 6.7% at diagnosis.    Diverticulosis    GERD (gastroesophageal reflux disease)    occ   Hepatic steatosis 07/2020   noted on CT abd/pelv done for GE illness   History of colitis 08/06/2020   presumed infectious. CT abd/pelv w/contrast at [facility]->long segment circumferential wall thickening of descending colon   History of nonmelanoma skin cancer    BCC   Hx of adenomatous colonic polyps     Hyperlipidemia    Hypertension    w/hypertensive retinopathy   Internal hemorrhoids    Obesity, Class II, BMI 35-39.9    Osteoarthritis of both knees    mod to severe 2022, steroid injections by ortho helped some.  Visco injections on the right --no significant help.   Past Surgical History:  Procedure Laterality Date   CARDIOVASCULAR STRESS TEST  02/2013   2015 LexiScan/low-level exercise-Myoview (02/2013): No ischemia, EF 53%, occ symptomatic PVCs.  03/2022 NORMAL/LOW RISK   COLONOSCOPY  02/2021   2023 normal.  Recall 5 yrs   COLONOSCOPY W/ POLYPECTOMY  2005;08/21/10;11/29/15   adenomatous; also mild diverticulosis; recall 2017.  iFOB neg 09/2011.  11/2015: tubular adenoma x 1. 02/2021 normal->recall 63yrs   HAMMER TOE SURGERY Right 05/24/2020   remvoal sebaceous cyst  15-20 years ago   TONSILLECTOMY     TRANSTHORACIC ECHOCARDIOGRAM     12/2021 EF 55-60%, grd I DD, mild LAE   No Known Allergies Prior to Admission medications   Medication Sig Start Date End Date Taking? Authorizing Provider  apixaban (ELIQUIS) 5 MG TABS tablet Take 1 tablet (5 mg total) by mouth 2 (two) times daily. 12/27/21  Yes [name], MD  atorvastatin (LIPITOR) 40 MG tablet Take 1 tablet (40 mg total) by mouth daily. 12/22/21  Yes McGowen, Adrian Blackwater, MD  budesonide-formoterol [facility]) 160-4.5 MCG/ACT inhaler Inhale 2 puffs into the lungs 2 (two) times daily as needed (wheezing).   Yes [provider]  diclofenac Sodium (VOLTAREN) 1 % GEL Apply 4 g topically 4 (four) times daily.  Patient taking differently: Apply 4 g topically daily as needed (pain). 12/26/21  Yes McGowen, Adrian Blackwater, MD  hydrochlorothiazide (HYDRODIURIL) 25 MG tablet Take 1 tablet (25 mg total) by mouth daily. 12/22/21  Yes McGowen, Adrian Blackwater, MD  metFORMIN (GLUCOPHAGE XR) 500 MG 24 hr tablet 2 tabs po daily with supper 12/22/21  Yes McGowen, Adrian Blackwater, MD  metoprolol tartrate (LOPRESSOR) 25 MG tablet Take 1 tablet (25 mg total) by mouth as  needed (racing heart beat). Take as needed for racing heart beat 12/27/21  Yes Skains, Thana Farr, MD  potassium chloride SA (KLOR-CON M20) 20 MEQ tablet Take 2 tablets (40 mEq total) by mouth 2 (two) times daily. 12/22/21  Yes McGowen, Adrian Blackwater, MD  glucose blood (FREESTYLE LITE) test strip USE TO CHECK BLOOD SUGAR 1-2 TIMES DAILY AS INSTRUCTED. 06/22/21   McGowen, Adrian Blackwater, MD  sildenafil (VIAGRA) 100 MG tablet 1/2-1 tab po qd as needed.  Take 30-60 min prior to intercourse 04/18/22   McGowen, Adrian Blackwater, MD   Social History   Socioeconomic History   Marital status: Married    Spouse name: Not on file   Number of children: Not on file   Years of education: Not on file   Highest education level: Bachelor's degree (e.g., BA, AB, BS)  Occupational History   Not on file  Tobacco Use   Smoking status: Never   Smokeless tobacco: Never  Vaping Use   Vaping Use: Never used  Substance and Sexual Activity   Alcohol use: Yes    Alcohol/week: 3.0 standard drinks of alcohol    Types: 3 Shots of liquor per week    Comment: occ   Drug use: No   Sexual activity: Yes  Other Topics Concern   Not on file  Social History Narrative   Married x 40 yrs.   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   No T/A/Ds.   Social Determinants of Health   Financial Resource Strain: Low Risk  (05/09/2022)   Overall Financial Resource Strain (CARDIA)    Difficulty of Paying Living Expenses: Not hard at all  Food Insecurity: No Food Insecurity (05/09/2022)   Hunger Vital Sign    Worried About Running Out of Food in the Last Year: Never true    Ran Out of Food in the Last Year: Never true  Transportation Needs: No Transportation Needs (05/09/2022)   PRAPARE - Hydrologist (Medical): No    Lack of Transportation (Non-Medical): No  Physical Activity: Sufficiently Active (05/09/2022)   Exercise Vital Sign    Days of Exercise per Week: 4 days    Minutes of Exercise per  Session: 50 min  Stress: No Stress Concern Present (05/09/2022)   [place]    Feeling of Stress : Not at all  Social Connections: [place] (05/09/2022)   Social Connection and Isolation Panel [NHANES]    Frequency of Communication with Friends and Family: More than three times a week    Frequency of Social Gatherings with Friends and Family: More than three times a week    Attends Religious Services: More than 4 times per year    Active Member of [employer] or Organizations: Yes    Attends Archivist Meetings: 1 to 4 times per year    Marital Status: Married   Family History  Problem Relation Age of Onset   Heart attack Mother  Diabetes Father    Stroke Father    Stroke Other    Colon cancer Neg Hx    Colon polyps Neg Hx    Rectal cancer Neg Hx    Stomach cancer Neg Hx    Esophageal cancer Neg Hx     ROS: Currently denies lightheadedness, dizziness, Fever, chills, CP, SOB.   No personal history of DVT, PE, MI, or CVA. No loose teeth or dentures All other systems have been reviewed and were otherwise currently negative with the exception of those mentioned in the HPI and as above.  Objective: Vitals: Ht: 5'4" Wt: 188 lbs Temp: 97.7 BP: 152/80 Pulse: 61 O2 98% on room air.   Physical Exam: General: Alert, NAD.  Antalgic Gait  HEENT: EOMI, Good Neck Extension  Pulm: No increased work of breathing.  Clear B/L A/P w/o crackle or wheeze.  CV: RRR, No m/g/r appreciated. Not currently in A-Fib GI: soft, NT, ND. BS x 4 quadrants Neuro: CN II-XII grossly intact without focal deficit.  Sensation intact distally Skin: No lesions in the area of chief complaint MSK/Surgical Site:  + JLT. ROM 0-115.  5/5 strength in extension and flexion.  +EHL/FHL.  NVI.  Stable varus and valgus stress.    Imaging Review Plain radiographs demonstrate severe degenerative joint disease of the right knee.   The overall  alignment issignificant valgus of the right knee. The bone quality appears to be fair for age and reported activity level.  Preoperative templating of the joint replacement has been completed, documented, and submitted to the Operating Room personnel in order to optimize intra-operative equipment management.  Assessment: OA RIGHT KNEE Active Problems:   * No active hospital problems. *   Plan: Plan for Procedure(s): TOTAL KNEE ARTHROPLASTY  The patient history, physical exam, clinical judgement of the provider and imaging are consistent with end stage degenerative joint disease and total joint arthroplasty is deemed medically necessary. The treatment options including medical management, injection therapy, and arthroplasty were discussed at length. The risks and benefits of Procedure(s): TOTAL KNEE ARTHROPLASTY were presented and reviewed.  The risks of nonoperative treatment, versus surgical intervention including but not limited to continued pain, aseptic loosening, stiffness, dislocation/subluxation, infection, bleeding, nerve injury, blood clots, cardiopulmonary complications, morbidity, mortality, among others were discussed. The patient verbalizes understanding and wishes to proceed with the plan.  Patient is being admitted for inpatient treatment for surgery, pain control, PT, prophylactic antibiotics, VTE prophylaxis, progressive ambulation, ADL's and discharge planning.   Dental prophylaxis discussed and recommended for 2 years postoperatively.  The patient does meet the criteria for TXA which will be used perioperatively.   His normal daily Eliquis will be used postoperatively for DVT prophylaxis in addition to SCDs, and early ambulation. Plan for Tylenol and Oxycodone for pain.   Prefers to avoid NSAIDs Robaxin for muscle spasms.   Zofran for nausea and vomiting. Senokot for constipation prevention [facility] on [address] The patient is planning to be discharged  home with OPPT and into the care of his wife Michael Hodge who can be reached at [phone] Follow up appt 06/13/22 at 4:15pm     Alisa Graff Office [phone] 05/15/2022 3:29 PM

## 2022-05-15 NOTE — Patient Instructions (Signed)
DUE TO COVID-19 ONLY TWO VISITORS  (aged 74 and older)  ARE ALLOWED TO COME WITH YOU AND STAY IN THE WAITING ROOM ONLY DURING PRE OP AND PROCEDURE.   **NO VISITORS ARE ALLOWED IN THE SHORT STAY AREA OR RECOVERY ROOM!!**  IF YOU WILL BE ADMITTED INTO THE HOSPITAL YOU ARE ALLOWED ONLY FOUR SUPPORT PEOPLE DURING VISITATION HOURS ONLY (7 AM -8PM)   The support person(s) must pass our screening, gel in and out, and wear a mask at all times, including in the patient's room. Patients must also wear a mask when staff or their support person are in the room. Visitors GUEST BADGE MUST BE WORN VISIBLY  One adult visitor may remain with you overnight and MUST be in the room by 8 P.M.     Your procedure is scheduled on: 05/29/22   Report to [facility] Main Entrance    Report to admitting at 5:15 AM   Call this number if you have problems the morning of surgery [phone]   Do not eat food :After Midnight.   After Midnight you may have the following liquids until _4:30 AM/  DAY OF SURGERY  Water Black Coffee (sugar ok, NO MILK/CREAM OR CREAMERS)  Tea (sugar ok, NO MILK/CREAM OR CREAMERS) regular and decaf                             Plain Jell-O (NO RED)                                           Fruit ices (not with fruit pulp, NO RED)                                     Popsicles (NO RED)                                                                  Juice: apple, WHITE grape, WHITE cranberry Sports drinks like Gatorade (NO RED)              Drink 2 Ensure/G2 drinks AT 10:00 PM the night before surgery.        The day of surgery:  Drink ONE G2 at4:15  AM the morning of surgery. Drink in one sitting. Do not sip.  This drink was given to you during your hospital  pre-op appointment visit. Nothing else to drink after completing the   G2. At 4:30          If you have questions, please contact your surgeon's office.      Oral Hygiene is also important to reduce your risk of  infection.                                    Remember - BRUSH YOUR TEETH THE MORNING OF SURGERY WITH YOUR REGULAR TOOTHPASTE  DENTURES WILL BE REMOVED PRIOR TO SURGERY PLEASE DO NOT APPLY "Poly grip" OR ADHESIVES!!!   Do  NOT smoke after Midnight   Take these medicines the morning of surgery with A SIP OF WATER: Use your inhaler and bring it with you                                                                                                                           Atorvastatin                                                                                                                           Metoprolol   Before surgery.Stop taking __Eliquis  72 hours(3 days) prior to day of surgery. Last dose_on ___3/29_______as instructed by _____________.  Contact your Surgeon/Cardiologist for instructions on Anticoagulant Therapy prior to surgery.   DO NOT TAKE ANY ORAL DIABETIC MEDICATIONS DAY OF YOUR SURGERY (Metformin)  Bring CPAP mask and tubing day of surgery.                              You may not have any metal on your body including , jewelry, and body piercing             Do not wear lotions, powders, cologne, or deodorant               Men may shave face and neck.   Do not bring valuables to the hospital. [place].   Contacts, glasses, or bridgework may not be worn into surgery.   DO NOT [place].     Patients discharged on the day of surgery will not be allowed to drive home.  Someone NEEDS to stay with you for the first 24 hours after anesthesia.   Special Instructions: Bring a copy of your healthcare power of attorney and living will documents the day of surgery if you haven't scanned them before.              Please read over the following fact sheets you were given: IF YOU HAVE QUESTIONS ABOUT YOUR PRE-OP INSTRUCTIONS PLEASE CALL [phone]    [facility] - Preparing for  Surgery Before surgery, you can play an important role.  Because skin is not sterile, your skin needs to be as free of germs as possible.  You can reduce the number of  germs on your skin by washing with CHG (chlorahexidine gluconate) soap before surgery.  CHG is an antiseptic cleaner which kills germs and bonds with the skin to continue killing germs even after washing. Please DO NOT use if you have an allergy to CHG or antibacterial soaps.  If your skin becomes reddened/irritated stop using the CHG and inform your nurse when you arrive at Short Stay. You may shave your face/neck. Please follow these instructions carefully:  1.  Shower with CHG Soap the night before surgery and the  morning of Surgery.  2.  If you choose to wash your hair, wash your hair first as usual with your  normal  shampoo.  3.  After you shampoo, rinse your hair and body thoroughly to remove the  shampoo.                            4.  Use CHG as you would any other liquid soap.  You can apply chg directly  to the skin and wash                       Gently with a scrungie or clean washcloth.  5.  Apply the CHG Soap to your body ONLY FROM THE NECK DOWN.   Do not use on face/ open                           Wound or open sores. Avoid contact with eyes, ears mouth and genitals (private parts).                       Wash face,  Genitals (private parts) with your normal soap.             6.  Wash thoroughly, paying special attention to the area where your surgery  will be performed.  7.  Thoroughly rinse your body with warm water from the neck down.  8.  DO NOT shower/wash with your normal soap after using and rinsing off  the CHG Soap.             9.  Pat yourself dry with a clean towel.            10.  Wear clean pajamas.            11.  Place clean sheets on your bed the night of your first shower and do not  sleep with pets. Day of Surgery : Do not apply any lotions/deodorants the morning of surgery.  Please wear clean  clothes to the hospital/surgery center.  FAILURE TO FOLLOW THESE INSTRUCTIONS MAY RESULT IN THE CANCELLATION OF YOUR SURGERY  PATIENT SIGNATURE_________________________________   ________________________________________________________________________  Michael Hodge  An incentive spirometer is a tool that can help keep your lungs clear and active. This tool measures how well you are filling your lungs with each breath. Taking long deep breaths may help reverse or decrease the chance of developing breathing (pulmonary) problems (especially infection) following: A long period of time when you are unable to move or be active. BEFORE THE PROCEDURE  If the spirometer includes an indicator to show your best effort, your nurse or respiratory therapist will set it to a desired goal. If possible, sit up straight or lean slightly forward. Try not to slouch. Hold the incentive spirometer in an upright position. INSTRUCTIONS FOR USE  Sit  on the edge of your bed if possible, or sit up as far as you can in bed or on a chair. Hold the incentive spirometer in an upright position. Breathe out normally. Place the mouthpiece in your mouth and seal your lips tightly around it. Breathe in slowly and as deeply as possible, raising the piston or the ball toward the top of the column. Hold your breath for 3-5 seconds or for as long as possible. Allow the piston or ball to fall to the bottom of the column. Remove the mouthpiece from your mouth and breathe out normally. Rest for a few seconds and repeat Steps 1 through 7 at least 10 times every 1-2 hours when you are awake. Take your time and take a few normal breaths between deep breaths. The spirometer may include an indicator to show your best effort. Use the indicator as a goal to work toward during each repetition. After each set of 10 deep breaths, practice coughing to be sure your lungs are clear. If you have an incision (the cut made at the time of  surgery), support your incision when coughing by placing a pillow or rolled up towels firmly against it. Once you are able to get out of bed, walk around indoors and cough well. You may stop using the incentive spirometer when instructed by your caregiver.  RISKS AND COMPLICATIONS Take your time so you do not get dizzy or light-headed. If you are in pain, you may need to take or ask for pain medication before doing incentive spirometry. It is harder to take a deep breath if you are having pain. AFTER USE Rest and breathe slowly and easily. It can be helpful to keep track of a log of your progress. Your caregiver can provide you with a simple table to help with this. If you are using the spirometer at home, follow these instructions: [place] IF:  You are having difficultly using the spirometer. You have trouble using the spirometer as often as instructed. Your pain medication is not giving enough relief while using the spirometer. You develop fever of 100.5 F (38.1 C) or higher. SEEK IMMEDIATE MEDICAL CARE IF:  You cough up bloody sputum that had not been present before. You develop fever of 102 F (38.9 C) or greater. You develop worsening pain at or near the incision site. MAKE SURE YOU:  Understand these instructions. Will watch your condition. Will get help right away if you are not doing well or get worse. Document Released: 06/25/2006 Document Revised: 05/07/2011 Document Reviewed: 08/26/2006 [facility] Patient Information 2014 [place], [place].   ________________________________________________________________________

## 2022-05-16 ENCOUNTER — Other Ambulatory Visit (HOSPITAL_COMMUNITY): Payer: Medicare PPO

## 2022-05-17 ENCOUNTER — Encounter (HOSPITAL_COMMUNITY): Payer: Self-pay

## 2022-05-17 ENCOUNTER — Encounter (HOSPITAL_COMMUNITY)
Admission: RE | Admit: 2022-05-17 | Discharge: 2022-05-17 | Disposition: A | Discharge status: Home / Self Care | Payer: Medicare PPO | Source: Ambulatory Visit | Attending: Orthopedic Surgery | Admitting: Orthopedic Surgery

## 2022-05-17 ENCOUNTER — Other Ambulatory Visit: Payer: Self-pay

## 2022-05-17 DIAGNOSIS — E119 Type 2 diabetes mellitus without complications: Secondary | ICD-10-CM

## 2022-05-17 DIAGNOSIS — I480 Paroxysmal atrial fibrillation: Secondary | ICD-10-CM | POA: Insufficient documentation

## 2022-05-17 DIAGNOSIS — M1711 Unilateral primary osteoarthritis, right knee: Secondary | ICD-10-CM | POA: Diagnosis not present

## 2022-05-17 DIAGNOSIS — I2510 Atherosclerotic heart disease of native coronary artery without angina pectoris: Secondary | ICD-10-CM | POA: Insufficient documentation

## 2022-05-17 DIAGNOSIS — I10 Essential (primary) hypertension: Secondary | ICD-10-CM | POA: Diagnosis not present

## 2022-05-17 DIAGNOSIS — Z01818 Encounter for other preprocedural examination: Secondary | ICD-10-CM

## 2022-05-17 DIAGNOSIS — Z01812 Encounter for preprocedural laboratory examination: Secondary | ICD-10-CM | POA: Diagnosis not present

## 2022-05-17 LAB — BASIC METABOLIC PANEL
Anion gap: 9 (ref 5–15)
BUN: 21 mg/dL (ref 8–23)
CO2: 27 mmol/L (ref 22–32)
Calcium: 9.5 mg/dL (ref 8.9–10.3)
Chloride: 101 mmol/L (ref 98–111)
Creatinine, Ser: 0.94 mg/dL (ref 0.61–1.24)
GFR, Estimated: >60 mL/min (ref >60)
Glucose, Bld: 123 mg/dL — ABNORMAL HIGH (ref 70–99)
Potassium: 3.6 mmol/L (ref 3.5–5.1)
Sodium: 137 mmol/L (ref 135–145)

## 2022-05-17 LAB — CBC
HCT: 40.5 % (ref 39.0–52.0)
Hemoglobin: 13 g/dL (ref 13.0–17.0)
MCH: 29.4 pg (ref 26.0–34.0)
MCHC: 32.1 g/dL (ref 30.0–36.0)
MCV: 91.6 fL (ref 80.0–100.0)
Platelets: 184 10*3/uL (ref 150–400)
RBC: 4.42 MIL/uL (ref 4.22–5.81)
RDW: 14.6 % (ref 11.5–15.5)
WBC: 7.1 10*3/uL (ref 4.0–10.5)
nRBC: 0 % (ref 0.0–0.2)

## 2022-05-17 LAB — SURGICAL PCR SCREEN
MRSA, PCR: NEGATIVE
Staphylococcus aureus: POSITIVE — AB

## 2022-05-17 LAB — GLUCOSE, CAPILLARY: Glucose-Capillary: 125 mg/dL — ABNORMAL HIGH (ref 70–99)

## 2022-05-17 NOTE — Progress Notes (Signed)
  Anesthesia note:      PCP - Dr. Elijah Birk Cardiologist -Dr. Candee Furbish Other-   Chest x-ray - 03/27/22-epic EKG - 05/08/21-epic Stress Test - 04/16/22-epic ECHO- 01/17/22-epic Cardiac Cath - no CABG-no Pacemaker/ICD device last checked:no  Sleep Study - no CPAP -    CBG at PAT visit-125 Fasting Blood Sugar at home-108-125 Checks Blood Sugar _once a month____  Blood Thinner:Eliquis for Afib Blood Thinner Instructions:hold 3 days Aspirin Instructions: Last Dose:3/29  Anesthesia review: Yes  reason:  Patient denies shortness of breath, fever, cough and chest pain at PAT appointment. Pt reports no SOB with any activities   Patient verbalized understanding of instructions that were given to them at the PAT appointment. Patient was also instructed that they will need to review over the PAT instructions again at home before surgery.yes

## 2022-05-18 LAB — HEMOGLOBIN A1C
Hgb A1c MFr Bld: 7.5 % — ABNORMAL HIGH (ref 4.8–5.6)
Mean Plasma Glucose: 169 mg/dL

## 2022-05-18 NOTE — Progress Notes (Signed)
Staph positive result from PCR, results faxed to Mr. Murphy's office via epic.

## 2022-05-21 NOTE — Anesthesia Preprocedure Evaluation (Addendum)
Anesthesia Evaluation  Patient identified by MRN, date of birth, ID band Patient awake    Reviewed: Allergy & Precautions, NPO status , Patient's Chart, lab work & pertinent test results  History of Anesthesia Complications Negative for: history of anesthetic complications  Airway Mallampati: III  TM Distance: >3 FB Neck ROM: Full    Dental  (+) Dental Advisory Given   Pulmonary neg pulmonary ROS   Pulmonary exam normal breath sounds clear to auscultation       Cardiovascular hypertension (HCTZ, metoprolol), Pt. on medications (-) angina + CAD  (-) Past MI and (-) Cardiac Stents + dysrhythmias Atrial Fibrillation  Rhythm:Regular Rate:Normal  HLD  Low-risk stress test 04/16/2022  TTE 01/17/2022: IMPRESSIONS     1. Left ventricular ejection fraction, by estimation, is 55 to 60%. The  left ventricle has normal function. The left ventricle has no regional  wall motion abnormalities. Left ventricular diastolic parameters are  consistent with Grade I diastolic  dysfunction (impaired relaxation).   2. Right ventricular systolic function is normal. The right ventricular  size is normal. There is normal pulmonary artery systolic pressure. The  estimated right ventricular systolic pressure is 0000000 mmHg.   3. Left atrial size was mildly dilated.   4. The mitral valve is grossly normal. Trivial mitral valve  regurgitation.   5. The aortic valve is tricuspid. Aortic valve regurgitation is not  visualized.   6. The inferior vena cava is dilated in size with >50% respiratory  variability, suggesting right atrial pressure of 8 mmHg.     Neuro/Psych neg Seizures  Neuromuscular disease (sciatica)    GI/Hepatic ,GERD  ,,Hepatic steatosis diverticulosis   Endo/Other  diabetes (Hgb A1c 7.5), Poorly Controlled, Type 2, Oral Hypoglycemic Agents    Renal/GU negative Renal ROS     Musculoskeletal  (+) Arthritis , Osteoarthritis,     Abdominal   Peds  Hematology negative hematology ROS (+)   Anesthesia Other Findings Last Eliquis: 05/25/2022  Reproductive/Obstetrics                             Anesthesia Physical Anesthesia Plan  ASA: 3  Anesthesia Plan: Regional, Spinal and MAC   Post-op Pain Management: Tylenol PO (pre-op)* and Regional block*   Induction: Intravenous  PONV Risk Score and Plan: 1 and Ondansetron, Dexamethasone and Treatment may vary due to age or medical condition  Airway Management Planned: Natural Airway and Simple Face Mask  Additional Equipment:   Intra-op Plan:   Post-operative Plan:   Informed Consent: I have reviewed the patients History and Physical, chart, labs and discussed the procedure including the risks, benefits and alternatives for the proposed anesthesia with the patient or authorized representative who has indicated his/her understanding and acceptance.     Dental advisory given  Plan Discussed with: CRNA and Anesthesiologist  Anesthesia Plan Comments: (See PAT note 05/17/2022  Discussed potential risks of nerve blocks including, but not limited to, infection, bleeding, nerve damage, seizures, pneumothorax, respiratory depression, and potential failure of the block. Alternatives to nerve blocks discussed. All questions answered.  I have discussed risks of neuraxial anesthesia including but not limited to infection, bleeding, nerve injury, back pain, headache, seizures, and failure of block. Patient denies bleeding disorders and is not currently anticoagulated. Labs have been reviewed. Risks and benefits discussed. All patient's questions answered.   Discussed with patient risks of MAC including, but not limited to, minor pain or discomfort, hearing people  in the room, and possible need for backup general anesthesia. Risks for general anesthesia also discussed including, but not limited to, sore throat, hoarse voice, chipped/damaged teeth,  injury to vocal cords, nausea and vomiting, allergic reactions, lung infection, heart attack, stroke, and death. All questions answered. )       Anesthesia Quick Evaluation

## 2022-05-21 NOTE — Progress Notes (Signed)
Anesthesia Chart Review   Case: [id number] Date/Time: 05/29/22 0715   Procedure: TOTAL KNEE ARTHROPLASTY (Right: Knee)   Anesthesia type: Spinal   Pre-op diagnosis: OA RIGHT KNEE   Location: [facility] ROOM 08 / [facility]   Surgeons: Renette Butters, MD       DISCUSSION:72 y.o. never smoker with h/o HTN, DM II, CAD, paroxysmal atrial fibrillation, right knee OA scheduled for above procedure 05/29/22 with Dr. Edmonia Lynch.   Pt seen by cardiology 05/09/22. Per OV note, "He may proceed with knee replacement, with Dr. Percell Miller.  He will be of low cardiac risk.  He would not need a Lovenox bridge.  Of course we may see perioperative atrial fibrillation.  He has as needed metoprolol.  Try to avoid excessive caffeine.  He knows to reach out if he has further worsening episodes."  Pt reports last dose of Eliquis 05/25/2022.   Anticipate pt can proceed with planned procedure barring acute status change.   VS: BP (!) 145/79   Pulse (!) 52   Temp 36.5 C (Oral)   Resp 16   Ht 5\' 6"  (1.676 m)   Wt 87.5 kg   SpO2 100%   BMI 31.15 kg/m   PROVIDERS: McGowen, Adrian Blackwater, MD is PCP   Candee Furbish, MD is Cardiologist  LABS: Labs reviewed: Acceptable for surgery. (all labs ordered are listed, but only abnormal results are displayed)  Labs Reviewed  SURGICAL PCR SCREEN - Abnormal; Notable for the following components:      Result Value   Staphylococcus aureus POSITIVE (*)    All other components within normal limits  HEMOGLOBIN A1C - Abnormal; Notable for the following components:   Hgb A1c MFr Bld 7.5 (*)    All other components within normal limits  BASIC METABOLIC PANEL - Abnormal; Notable for the following components:   Glucose, Bld 123 (*)    All other components within normal limits  GLUCOSE, CAPILLARY - Abnormal; Notable for the following components:   Glucose-Capillary 125 (*)    All other components within normal limits  CBC     IMAGES:   EKG:   CV: Myocardial Perfusion 04/16/2022    Resting ECG shows non specific ST abnormality in inferior leads   A pharmacological stress test was performed using IV Lexiscan 0.4mg  over 10 seconds performed without concurrent submaximal exercise. The patient reported dyspnea during the stress test. Normal blood pressure and normal heart rate response noted during stress. Heart rate recovery was normal.   No ST deviation from baseline was noted. Arrhythmias during stress: rare PVCs. Arrhythmias during recovery: rare PVCs. ECG was interpretable and without significant changes. The ECG was not diagnostic due to pharmacologic protocol.   There is no evidence of ischemia. There is no evidence of infarction.   Left ventricular function is normal. Nuclear stress EF: 50 %. The left ventricular ejection fraction is mildly decreased (45-54%). End diastolic cavity size is normal. End systolic cavity size is normal. No evidence of transient ischemic dilation (TID) noted.   The study is normal. The study is low risk.   Prior study available for comparison from 03/13/2013.  Echo 01/17/2022 1. Left ventricular ejection fraction, by estimation, is 55 to 60%. The  left ventricle has normal function. The left ventricle has no regional  wall motion abnormalities. Left ventricular diastolic parameters are  consistent with Grade I diastolic  dysfunction (impaired relaxation).   2. Right ventricular systolic function is normal. The right ventricular  size is normal.  There is normal pulmonary artery systolic pressure. The  estimated right ventricular systolic pressure is 0000000 mmHg.   3. Left atrial size was mildly dilated.   4. The mitral valve is grossly normal. Trivial mitral valve  regurgitation.   5. The aortic valve is tricuspid. Aortic valve regurgitation is not  visualized.   6. The inferior vena cava is dilated in size with >50% respiratory  variability, suggesting right atrial pressure of 8 mmHg.  Past Medical History:  Diagnosis Date   Atrial  fibrillation ([place])    11/2021   CAD (coronary artery disease)    CT demonstrated nonobstructive plaques (this test was prompted by borderline ETT.   Diabetes mellitus without complication ([place]) dx'd [id number]   Fasting gluc 136 and HbA1c 6.7% at diagnosis.    Diverticulosis    GERD (gastroesophageal reflux disease)    occ   Hepatic steatosis 07/2020   noted on CT abd/pelv done for GE illness   History of colitis 08/06/2020   presumed infectious. CT abd/pelv w/contrast at [facility]->long segment circumferential wall thickening of descending colon   History of nonmelanoma skin cancer    BCC   Hx of adenomatous colonic polyps    Hyperlipidemia    Hypertension    w/hypertensive retinopathy   Internal hemorrhoids    Obesity, Class II, BMI 35-39.9    Osteoarthritis of both knees    mod to severe 2022, steroid injections by ortho helped some.  Visco injections on the right --no significant help.    Past Surgical History:  Procedure Laterality Date   CARDIOVASCULAR STRESS TEST  02/2013   2015 LexiScan/low-level exercise-Myoview (02/2013): No ischemia, EF 53%, occ symptomatic PVCs.  03/2022 NORMAL/LOW RISK   COLONOSCOPY  02/2021   2023 normal.  Recall 5 yrs   COLONOSCOPY W/ POLYPECTOMY  2005;08/21/10;11/29/15   adenomatous; also mild diverticulosis; recall 2017.  iFOB neg 09/2011.  11/2015: tubular adenoma x 1. 02/2021 normal->recall 81yrs   HAMMER TOE SURGERY Right 05/24/2020   MOHS SURGERY  2018   head, and 2021 on Lt cheek   remvoal sebaceous cyst  15-20 years ago   TONSILLECTOMY     as a child   TRANSTHORACIC ECHOCARDIOGRAM     12/2021 EF 55-60%, grd I DD, mild LAE    MEDICATIONS:  apixaban (ELIQUIS) 5 MG TABS tablet   atorvastatin (LIPITOR) 40 MG tablet   budesonide-formoterol (SYMBICORT) 160-4.5 MCG/ACT inhaler   diclofenac Sodium (VOLTAREN) 1 % GEL   glucose blood (FREESTYLE LITE) test strip   hydrochlorothiazide (HYDRODIURIL) 25 MG tablet   metFORMIN (GLUCOPHAGE XR) 500  MG 24 hr tablet   metoprolol tartrate (LOPRESSOR) 25 MG tablet   potassium chloride SA (KLOR-CON M20) 20 MEQ tablet   sildenafil (VIAGRA) 100 MG tablet   No current facility-administered medications for this encounter.     Michael Felix Ward, PA-C [facility] [phone]

## 2022-05-22 ENCOUNTER — Other Ambulatory Visit: Payer: Self-pay

## 2022-05-23 ENCOUNTER — Ambulatory Visit: Payer: Medicare PPO | Admitting: Family Medicine

## 2022-05-23 NOTE — Care Plan (Signed)
Ortho Bundle Case Management Note  Patient Details  Name: Michael Hodge. MRN: [id number] Date of Birth: Jun 25, 1950  patient seen in the office for H&P with PA. will discharge to home with family. has rolling walker. CPM ordered for home use. OPPT set up with [facility]. discharge instructions given to patient and discussed. CM spoke with patient today. confirmed all appointments. no other needs. Patient and MD in agreement with plan. Choice offered                    DME Arranged:  [facility] DME Agency:  [employer]  HH Arranged:    [place] Agency:     Additional Comments: Please contact me with any questions of if this plan should need to change.  Ladell Heads,  [place] Specialist  [phone] 05/23/2022, 1:51 PM

## 2022-05-24 ENCOUNTER — Ambulatory Visit: Payer: Medicare PPO | Admitting: Family Medicine

## 2022-05-24 ENCOUNTER — Encounter: Payer: Self-pay | Admitting: Family Medicine

## 2022-05-24 VITALS — BP 128/72 | Ht 66.0 in | Wt 189.4 lb

## 2022-05-24 DIAGNOSIS — E7800 Pure hypercholesterolemia, unspecified: Secondary | ICD-10-CM | POA: Diagnosis not present

## 2022-05-24 DIAGNOSIS — Z0000 Encounter for general adult medical examination without abnormal findings: Secondary | ICD-10-CM

## 2022-05-24 DIAGNOSIS — E119 Type 2 diabetes mellitus without complications: Secondary | ICD-10-CM | POA: Diagnosis not present

## 2022-05-24 DIAGNOSIS — Z125 Encounter for screening for malignant neoplasm of prostate: Secondary | ICD-10-CM

## 2022-05-24 DIAGNOSIS — I10 Essential (primary) hypertension: Secondary | ICD-10-CM | POA: Diagnosis not present

## 2022-05-24 LAB — LIPID PANEL
Cholesterol: 97 mg/dL (ref 0–200)
HDL: 45.6 mg/dL (ref >39.00)
LDL Cholesterol: 33 mg/dL (ref 0–99)
NonHDL: 51.13
Total CHOL/HDL Ratio: 2
Triglycerides: 90 mg/dL (ref 0.0–149.0)
VLDL: 18 mg/dL (ref 0.0–40.0)

## 2022-05-24 LAB — PSA, MEDICARE: PSA: 0.8 ng/ml (ref 0.10–4.00)

## 2022-05-24 NOTE — Patient Instructions (Signed)
Health Maintenance, Male Adopting a healthy lifestyle and getting preventive care are important in promoting health and wellness. Ask your health care provider about: The right schedule for you to have regular tests and exams. Things you can do on your own to prevent diseases and keep yourself healthy. What should I know about diet, weight, and exercise? Eat a healthy diet  Eat a diet that includes plenty of vegetables, fruits, low-fat dairy products, and lean protein. Do not eat a lot of foods that are high in solid fats, added sugars, or sodium. Maintain a healthy weight Body mass index (BMI) is a measurement that can be used to identify possible weight problems. It estimates body fat based on height and weight. Your health care provider can help determine your BMI and help you achieve or maintain a healthy weight. Get regular exercise Get regular exercise. This is one of the most important things you can do for your health. Most adults should: Exercise for at least 150 minutes each week. The exercise should increase your heart rate and make you sweat (moderate-intensity exercise). Do strengthening exercises at least twice a week. This is in addition to the moderate-intensity exercise. Spend less time sitting. Even light physical activity can be beneficial. Watch cholesterol and blood lipids Have your blood tested for lipids and cholesterol at 74 years of age, then have this test every 5 years. You may need to have your cholesterol levels checked more often if: Your lipid or cholesterol levels are high. You are older than 74 years of age. You are at high risk for heart disease. What should I know about cancer screening? Many types of cancers can be detected early and may often be prevented. Depending on your health history and family history, you may need to have cancer screening at various ages. This may include screening for: Colorectal cancer. Prostate cancer. Skin cancer. Lung  cancer. What should I know about heart disease, diabetes, and high blood pressure? Blood pressure and heart disease High blood pressure causes heart disease and increases the risk of stroke. This is more likely to develop in people who have high blood pressure readings or are overweight. Talk with your health care provider about your target blood pressure readings. Have your blood pressure checked: Every 3-5 years if you are 18-39 years of age. Every year if you are 40 years old or older. If you are between the ages of 65 and 75 and are a current or former smoker, ask your health care provider if you should have a one-time screening for abdominal aortic aneurysm (AAA). Diabetes Have regular diabetes screenings. This checks your fasting blood sugar level. Have the screening done: Once every three years after age 45 if you are at a normal weight and have a low risk for diabetes. More often and at a younger age if you are overweight or have a high risk for diabetes. What should I know about preventing infection? Hepatitis B If you have a higher risk for hepatitis B, you should be screened for this virus. Talk with your health care provider to find out if you are at risk for hepatitis B infection. Hepatitis C Blood testing is recommended for: Everyone born from 1945 through 1965. Anyone with known risk factors for hepatitis C. Sexually transmitted infections (STIs) You should be screened each year for STIs, including gonorrhea and chlamydia, if: You are sexually active and are younger than 74 years of age. You are older than 74 years of age and your   health care provider tells you that you are at risk for this type of infection. Your sexual activity has changed since you were last screened, and you are at increased risk for chlamydia or gonorrhea. Ask your health care provider if you are at risk. Ask your health care provider about whether you are at high risk for HIV. Your health care provider  may recommend a prescription medicine to help prevent HIV infection. If you choose to take medicine to prevent HIV, you should first get tested for HIV. You should then be tested every 3 months for as long as you are taking the medicine. Follow these instructions at home: Alcohol use Do not drink alcohol if your health care provider tells you not to drink. If you drink alcohol: Limit how much you have to 0-2 drinks a day. Know how much alcohol is in your drink. In the [place] one drink equals one 12 oz bottle of beer (355 mL), one 5 oz glass of wine (148 mL), or one 1 oz glass of hard liquor (44 mL). Lifestyle Do not use any products that contain nicotine or tobacco. These products include cigarettes, chewing tobacco, and vaping devices, such as e-cigarettes. If you need help quitting, ask your health care provider. Do not use street drugs. Do not share needles. Ask your health care provider for help if you need support or information about quitting drugs. General instructions Schedule regular health, dental, and eye exams. Stay current with your vaccines. Tell your health care provider if: You often feel depressed. You have ever been abused or do not feel safe at home. Summary Adopting a healthy lifestyle and getting preventive care are important in promoting health and wellness. Follow your health care provider's instructions about healthy diet, exercising, and getting tested or screened for diseases. Follow your health care provider's instructions on monitoring your cholesterol and blood pressure. This information is not intended to replace advice given to you by your health care provider. Make sure you discuss any questions you have with your health care provider. Document Revised: 07/04/2020 Document Reviewed: 07/04/2020 Elsevier Patient Education  2023 [employer]  

## 2022-05-24 NOTE — Progress Notes (Signed)
Office Note 05/24/2022  CC:  Chief Complaint  Patient presents with   Medical Management of Chronic Issues    Pt is fasting    HPI:  Patient is a 72 y.o. male who is here for annual health maintenance exam and 51-month follow-up diabetes, hypertension, and hyperlipidemia.  Feeling well.  On getting right total knee arthroplasty in 5 days. He had preoperative labs.  The only abnormality was his hemoglobin A1c was 7.5%.  He went on a 2-week cruise prior to this A1c level check. He has started to make some dietary improvements and fasting sugars are around the 100-1 10 range lately.  Past Medical History:  Diagnosis Date   Atrial fibrillation ([place])    11/2021   CAD (coronary artery disease)    CT demonstrated nonobstructive plaques (this test was prompted by borderline ETT.   Diabetes mellitus without complication ([place]) dx'd [id number]   Fasting gluc 136 and HbA1c 6.7% at diagnosis.    Diverticulosis    GERD (gastroesophageal reflux disease)    occ   Hepatic steatosis 07/2020   noted on CT abd/pelv done for GE illness   History of colitis 08/06/2020   presumed infectious. CT abd/pelv w/contrast at [facility]->long segment circumferential wall thickening of descending colon   History of nonmelanoma skin cancer    BCC   Hx of adenomatous colonic polyps    Hyperlipidemia    Hypertension    w/hypertensive retinopathy   Internal hemorrhoids    Obesity, Class II, BMI 35-39.9    Osteoarthritis of both knees    mod to severe 2022, steroid injections by ortho helped some.  Visco injections on the right --no significant help.    Past Surgical History:  Procedure Laterality Date   CARDIOVASCULAR STRESS TEST  02/2013   2015 LexiScan/low-level exercise-Myoview (02/2013): No ischemia, EF 53%, occ symptomatic PVCs.  03/2022 NORMAL/LOW RISK   COLONOSCOPY  02/2021   2023 normal.  Recall 5 yrs   COLONOSCOPY W/ POLYPECTOMY  2005;08/21/10;11/29/15   adenomatous; also mild diverticulosis;  recall 2017.  iFOB neg 09/2011.  11/2015: tubular adenoma x 1. 02/2021 normal->recall 21yrs   HAMMER TOE SURGERY Right 05/24/2020   MOHS SURGERY  2018   head, and 2021 on Lt cheek   remvoal sebaceous cyst  15-20 years ago   TONSILLECTOMY     as a child   TRANSTHORACIC ECHOCARDIOGRAM     12/2021 EF 55-60%, grd I DD, mild LAE    Family History  Problem Relation Age of Onset   Heart attack Mother    Diabetes Father    Stroke Father    Stroke Other    Colon cancer Neg Hx    Colon polyps Neg Hx    Rectal cancer Neg Hx    Stomach cancer Neg Hx    Esophageal cancer Neg Hx     Social History   Socioeconomic History   Marital status: Married    Spouse name: Not on file   Number of children: Not on file   Years of education: Not on file   Highest education level: Bachelor's degree (e.g., BA, AB, BS)  Occupational History   Not on file  Tobacco Use   Smoking status: Never   Smokeless tobacco: Never  Vaping Use   Vaping Use: Never used  Substance and Sexual Activity   Alcohol use: Yes    Alcohol/week: 3.0 standard drinks of alcohol    Types: 3 Shots of liquor per week  Comment: occ   Drug use: No   Sexual activity: Yes  Other Topics Concern   Not on file  Social History Narrative   Married x 40 yrs.   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   No T/A/Ds.   Social Determinants of Health   Financial Resource Strain: Low Risk  (05/09/2022)   Overall Financial Resource Strain (CARDIA)    Difficulty of Paying Living Expenses: Not hard at all  Food Insecurity: No Food Insecurity (05/09/2022)   Hunger Vital Sign    Worried About Running Out of Food in the Last Year: Never true    Ran Out of Food in the Last Year: Never true  Transportation Needs: No Transportation Needs (05/09/2022)   PRAPARE - Hydrologist (Medical): No    Lack of Transportation (Non-Medical): No  Physical Activity: Sufficiently Active (05/09/2022)    Exercise Vital Sign    Days of Exercise per Week: 4 days    Minutes of Exercise per Session: 50 min  Stress: No Stress Concern Present (05/09/2022)   [place]    Feeling of Stress : Not at all  Social Connections: [place] (05/09/2022)   Social Connection and Isolation Panel [NHANES]    Frequency of Communication with Friends and Family: More than three times a week    Frequency of Social Gatherings with Friends and Family: More than three times a week    Attends Religious Services: More than 4 times per year    Active Member of [employer] or Organizations: Yes    Attends Archivist Meetings: 1 to 4 times per year    Marital Status: Married  Human resources officer Violence: Not At Risk (05/09/2022)   Humiliation, Afraid, Rape, and Kick questionnaire    Fear of Current or Ex-Partner: No    Emotionally Abused: No    Physically Abused: No    Sexually Abused: No    Outpatient Medications Prior to Visit  Medication Sig Dispense Refill   apixaban (ELIQUIS) 5 MG TABS tablet Take 1 tablet (5 mg total) by mouth 2 (two) times daily. 60 tablet 6   atorvastatin (LIPITOR) 40 MG tablet Take 1 tablet (40 mg total) by mouth daily. 90 tablet 3   diclofenac Sodium (VOLTAREN) 1 % GEL Apply 4 g topically 4 (four) times daily. (Patient taking differently: Apply 4 g topically daily as needed (pain).) 100 g 3   glucose blood (FREESTYLE LITE) test strip USE TO CHECK BLOOD SUGAR 1-2 TIMES DAILY AS INSTRUCTED. 50 each 3   hydrochlorothiazide (HYDRODIURIL) 25 MG tablet Take 1 tablet (25 mg total) by mouth daily. 90 tablet 3   metFORMIN (GLUCOPHAGE XR) 500 MG 24 hr tablet 2 tabs po daily with supper 180 tablet 3   mupirocin ointment (BACTROBAN) 2 % Apply topically.     potassium chloride SA (KLOR-CON M20) 20 MEQ tablet Take 2 tablets (40 mEq total) by mouth 2 (two) times daily. 360 tablet 3   sildenafil (VIAGRA) 100 MG tablet 1/2-1 tab po  qd as needed.  Take 30-60 min prior to intercourse 15 tablet 6   metoprolol tartrate (LOPRESSOR) 25 MG tablet Take 1 tablet (25 mg total) by mouth as needed (racing heart beat). Take as needed for racing heart beat (Patient not taking: Reported on 05/24/2022) 60 tablet 1   No facility-administered medications prior to visit.    No Known Allergies  Review of  Systems  Constitutional:  Negative for appetite change, chills, fatigue and fever.  HENT:  Negative for congestion, dental problem, ear pain and sore throat.   Eyes:  Negative for discharge, redness and visual disturbance.  Respiratory:  Negative for cough, chest tightness, shortness of breath and wheezing.   Cardiovascular:  Negative for chest pain, palpitations and leg swelling.  Gastrointestinal:  Negative for abdominal pain, blood in stool, diarrhea, nausea and vomiting.  Genitourinary:  Negative for difficulty urinating, dysuria, flank pain, frequency, hematuria and urgency.  Musculoskeletal:  Positive for arthralgias (chronic R knee pain). Negative for back pain, joint swelling, myalgias and neck stiffness.  Skin:  Negative for pallor and rash.  Neurological:  Negative for dizziness, speech difficulty, weakness and headaches.  Hematological:  Negative for adenopathy. Does not bruise/bleed easily.  Psychiatric/Behavioral:  Negative for confusion and sleep disturbance. The patient is not nervous/anxious.     PE;    05/24/2022    9:51 AM 05/17/2022    9:19 AM 05/09/2022    3:50 PM  Vitals with BMI  Height 5\' 6"  5\' 6"    Weight 189 lbs 6 oz 193 lbs 185 lbs  BMI 30.58 [id number]  Systolic  [id number]   Diastolic  79   Pulse  52     Gen: Alert, well appearing.  Patient is oriented to person, place, time, and situation. AFFECT: pleasant, lucid thought and speech. ENT: Ears: EACs clear, normal epithelium.  TMs with good light reflex and landmarks bilaterally.  Eyes: no injection, icteris, swelling, or exudate.  EOMI, PERRLA. Nose: no  drainage or turbinate edema/swelling.  No injection or focal lesion.  Mouth: lips without lesion/swelling.  Oral mucosa pink and moist.  Dentition intact and without obvious caries or gingival swelling.  Oropharynx without erythema, exudate, or swelling.  Neck: supple/nontender.  No LAD, mass, or TM.  Carotid pulses 2+ bilaterally, without bruits. CV: RRR, no m/r/g.   LUNGS: CTA bilat, nonlabored resps, good aeration in all lung fields. ABD: soft, NT, ND, BS normal.  No hepatospenomegaly or mass.  No bruits. EXT: no clubbing, cyanosis, or edema.  Musculoskeletal: no joint swelling, erythema, warmth, or tenderness.  ROM of all joints intact. Skin - no sores or suspicious lesions or rashes or color changes  Pertinent labs:  Lab Results  Component Value Date   TSH 3.35 12/22/2021   Lab Results  Component Value Date   WBC 7.1 05/17/2022   HGB 13.0 05/17/2022   HCT 40.5 05/17/2022   MCV 91.6 05/17/2022   PLT 184 05/17/2022   Lab Results  Component Value Date   CREATININE 0.94 05/17/2022   BUN 21 05/17/2022   NA 137 05/17/2022   K 3.6 05/17/2022   CL 101 05/17/2022   CO2 27 05/17/2022   Lab Results  Component Value Date   ALT 22 12/22/2021   AST 22 12/22/2021   ALKPHOS 61 12/22/2021   BILITOT 1.0 12/22/2021   Lab Results  Component Value Date   CHOL 88 12/22/2021   Lab Results  Component Value Date   HDL 37.40 (L) 12/22/2021   Lab Results  Component Value Date   LDLCALC 33 12/22/2021   Lab Results  Component Value Date   TRIG 85.0 12/22/2021   Lab Results  Component Value Date   CHOLHDL 2 12/22/2021   Lab Results  Component Value Date   PSA 0.68 06/22/2021   PSA 0.89 05/31/2020   PSA 1.36 10/14/2018   Lab Results  Component Value Date  HGBA1C 7.5 (H) 05/17/2022   ASSESSMENT AND PLAN:   #1 health maintenance exam: Reviewed age and gender appropriate health maintenance issues (prudent diet, regular exercise, health risks of tobacco and excessive  alcohol, use of seatbelts, fire alarms in home, use of sunscreen).  Also reviewed age and gender appropriate health screening as well as vaccine recommendations. Vaccines: ALL UTD. Labs: PSA and lipids Prostate ca screening: PSA today Colon ca screening: Recall 2028. AAA screening: does not qualify. Lung ca screening: does not qualify. Hep C screen NEG 2018.  #2 diabetes without complication. Most recent A1c up to 7.5%.  He has started to make some dietary improvements and notes fasting glucose is around 100-110 now. Next A1c in mid June.  He will make lab appointment for this.  3.  Hypertension, well-controlled on HCTZ 25 mg a day. Recent electrolytes normal.  #4 hypercholesterolemia, doing well on a atorvastatin 40 mg a day. Lipid panel today.  Recent hepatic panel normal.  #5 right knee osteoarthritis. He will get right total knee arthroplasty next week.  An After Visit Summary was printed and given to the patient.  FOLLOW UP:  Return in about 6 months (around 11/24/2022) for routine chronic illness f/u, also needs non-fasting lab appt for Hba1c in mid June.  Signed:  Crissie Sickles, MD           05/24/2022

## 2022-05-29 ENCOUNTER — Ambulatory Visit (HOSPITAL_COMMUNITY)
Admission: RE | Admit: 2022-05-29 | Discharge: 2022-05-29 | Disposition: A | Discharge status: Home / Self Care | Payer: Medicare PPO | Source: Ambulatory Visit | Attending: Orthopedic Surgery | Admitting: Orthopedic Surgery

## 2022-05-29 ENCOUNTER — Encounter (HOSPITAL_COMMUNITY)
Admission: RE | Disposition: A | Discharge status: Home / Self Care | Payer: Self-pay | Source: Ambulatory Visit | Attending: Orthopedic Surgery

## 2022-05-29 ENCOUNTER — Ambulatory Visit (HOSPITAL_BASED_OUTPATIENT_CLINIC_OR_DEPARTMENT_OTHER): Payer: Medicare PPO | Admitting: Anesthesiology

## 2022-05-29 ENCOUNTER — Ambulatory Visit (HOSPITAL_COMMUNITY): Payer: Medicare PPO

## 2022-05-29 ENCOUNTER — Ambulatory Visit (HOSPITAL_COMMUNITY): Payer: Medicare PPO | Admitting: Physician Assistant

## 2022-05-29 ENCOUNTER — Other Ambulatory Visit: Payer: Self-pay

## 2022-05-29 DIAGNOSIS — E785 Hyperlipidemia, unspecified: Secondary | ICD-10-CM | POA: Insufficient documentation

## 2022-05-29 DIAGNOSIS — Z79899 Other long term (current) drug therapy: Secondary | ICD-10-CM | POA: Diagnosis not present

## 2022-05-29 DIAGNOSIS — Z96651 Presence of right artificial knee joint: Secondary | ICD-10-CM

## 2022-05-29 DIAGNOSIS — Z7984 Long term (current) use of oral hypoglycemic drugs: Secondary | ICD-10-CM

## 2022-05-29 DIAGNOSIS — Z6835 Body mass index (BMI) 35.0-35.9, adult: Secondary | ICD-10-CM | POA: Insufficient documentation

## 2022-05-29 DIAGNOSIS — Z7901 Long term (current) use of anticoagulants: Secondary | ICD-10-CM | POA: Diagnosis not present

## 2022-05-29 DIAGNOSIS — K5790 Diverticulosis of intestine, part unspecified, without perforation or abscess without bleeding: Secondary | ICD-10-CM | POA: Diagnosis not present

## 2022-05-29 DIAGNOSIS — Z85828 Personal history of other malignant neoplasm of skin: Secondary | ICD-10-CM | POA: Diagnosis not present

## 2022-05-29 DIAGNOSIS — M1711 Unilateral primary osteoarthritis, right knee: Secondary | ICD-10-CM

## 2022-05-29 DIAGNOSIS — I2510 Atherosclerotic heart disease of native coronary artery without angina pectoris: Secondary | ICD-10-CM | POA: Insufficient documentation

## 2022-05-29 DIAGNOSIS — G8918 Other acute postprocedural pain: Secondary | ICD-10-CM | POA: Diagnosis not present

## 2022-05-29 DIAGNOSIS — K760 Fatty (change of) liver, not elsewhere classified: Secondary | ICD-10-CM | POA: Insufficient documentation

## 2022-05-29 DIAGNOSIS — K219 Gastro-esophageal reflux disease without esophagitis: Secondary | ICD-10-CM | POA: Insufficient documentation

## 2022-05-29 DIAGNOSIS — I4891 Unspecified atrial fibrillation: Secondary | ICD-10-CM

## 2022-05-29 DIAGNOSIS — E1165 Type 2 diabetes mellitus with hyperglycemia: Secondary | ICD-10-CM | POA: Insufficient documentation

## 2022-05-29 DIAGNOSIS — E119 Type 2 diabetes mellitus without complications: Secondary | ICD-10-CM

## 2022-05-29 DIAGNOSIS — Z471 Aftercare following joint replacement surgery: Secondary | ICD-10-CM | POA: Diagnosis not present

## 2022-05-29 DIAGNOSIS — E669 Obesity, unspecified: Secondary | ICD-10-CM | POA: Insufficient documentation

## 2022-05-29 DIAGNOSIS — M170 Bilateral primary osteoarthritis of knee: Secondary | ICD-10-CM | POA: Insufficient documentation

## 2022-05-29 DIAGNOSIS — I10 Essential (primary) hypertension: Secondary | ICD-10-CM

## 2022-05-29 DIAGNOSIS — Z01818 Encounter for other preprocedural examination: Secondary | ICD-10-CM

## 2022-05-29 HISTORY — PX: TOTAL KNEE ARTHROPLASTY: SHX125

## 2022-05-29 LAB — GLUCOSE, CAPILLARY
Glucose-Capillary: 143 mg/dL — ABNORMAL HIGH (ref 70–99)
Glucose-Capillary: 138 mg/dL — ABNORMAL HIGH (ref 70–99)

## 2022-05-29 SURGERY — ARTHROPLASTY, KNEE, TOTAL
Anesthesia: Monitor Anesthesia Care | Site: Knee | Laterality: Right

## 2022-05-29 MED ORDER — PROPOFOL 500 MG/50ML IV EMUL
INTRAVENOUS | Status: DC | PRN
  Administered 2022-05-29: 30 mg via INTRAVENOUS
  Administered 2022-05-29: 75 ug/kg/min via INTRAVENOUS

## 2022-05-29 MED ORDER — ORAL CARE MOUTH RINSE: 15.0000 mL | Freq: Once | OROMUCOSAL | Status: AC

## 2022-05-29 MED ORDER — METHOCARBAMOL 750 MG PO TABS: 750.0000 mg | ORAL_TABLET | Freq: Three times a day (TID) | ORAL | 0 refills | Status: DC | PRN

## 2022-05-29 MED ORDER — ONDANSETRON HCL 4 MG/2ML IJ SOLN
INTRAMUSCULAR | Status: DC | PRN
  Administered 2022-05-29: 4 mg via INTRAVENOUS

## 2022-05-29 MED ORDER — BUPIVACAINE HCL 0.25 % IJ SOLN
INTRAMUSCULAR | Status: AC
  Filled 2022-05-29: qty 1

## 2022-05-29 MED ORDER — FENTANYL CITRATE (PF) 100 MCG/2ML IJ SOLN
INTRAMUSCULAR | Status: AC
  Filled 2022-05-29: qty 2

## 2022-05-29 MED ORDER — BUPIVACAINE IN DEXTROSE 0.75-8.25 % IT SOLN
INTRATHECAL | Status: DC | PRN
  Administered 2022-05-29: 1.6 mL via INTRATHECAL

## 2022-05-29 MED ORDER — WATER FOR IRRIGATION, STERILE IR SOLN
Status: DC | PRN
  Administered 2022-05-29: 2000 mL

## 2022-05-29 MED ORDER — LIDOCAINE HCL (PF) 2 % IJ SOLN
INTRAMUSCULAR | Status: AC
  Filled 2022-05-29: qty 20

## 2022-05-29 MED ORDER — PHENYLEPHRINE 80 MCG/ML (10ML) SYRINGE FOR IV PUSH (FOR BLOOD PRESSURE SUPPORT)
PREFILLED_SYRINGE | INTRAVENOUS | Status: AC
  Filled 2022-05-29: qty 10

## 2022-05-29 MED ORDER — OXYCODONE HCL 5 MG PO TABS: 5.0000 mg | ORAL_TABLET | ORAL | 0 refills | Status: DC | PRN

## 2022-05-29 MED ORDER — LACTATED RINGERS IV BOLUS
250.0000 mL | Freq: Once | INTRAVENOUS | Status: AC
  Administered 2022-05-29: 250 mL via INTRAVENOUS

## 2022-05-29 MED ORDER — PHENYLEPHRINE HCL-NACL 20-0.9 MG/250ML-% IV SOLN
INTRAVENOUS | Status: AC
  Filled 2022-05-29: qty 500

## 2022-05-29 MED ORDER — POVIDONE-IODINE 10 % EX SWAB
2.0000 | Freq: Once | CUTANEOUS | Status: AC
  Administered 2022-05-29: 2 via TOPICAL

## 2022-05-29 MED ORDER — MIDAZOLAM HCL 2 MG/2ML IJ SOLN
INTRAMUSCULAR | Status: DC | PRN
  Administered 2022-05-29: 2 mg via INTRAVENOUS

## 2022-05-29 MED ORDER — SODIUM CHLORIDE 0.9 % IR SOLN
Status: DC | PRN
  Administered 2022-05-29: 1000 mL

## 2022-05-29 MED ORDER — DEXAMETHASONE SODIUM PHOSPHATE 10 MG/ML IJ SOLN: 8.0000 mg | Freq: Once | INTRAMUSCULAR | Status: DC

## 2022-05-29 MED ORDER — DEXAMETHASONE SODIUM PHOSPHATE 10 MG/ML IJ SOLN
INTRAMUSCULAR | Status: DC | PRN
  Administered 2022-05-29: 10 mg via INTRAVENOUS

## 2022-05-29 MED ORDER — CHLORHEXIDINE GLUCONATE 0.12 % MT SOLN
15.0000 mL | Freq: Once | OROMUCOSAL | Status: AC
  Administered 2022-05-29: 15 mL via OROMUCOSAL

## 2022-05-29 MED ORDER — EPHEDRINE SULFATE-NACL 50-0.9 MG/10ML-% IV SOSY
PREFILLED_SYRINGE | INTRAVENOUS | Status: DC | PRN
  Administered 2022-05-29 (×2): 5 mg via INTRAVENOUS

## 2022-05-29 MED ORDER — SENNA-DOCUSATE SODIUM 8.6-50 MG PO TABS: 2.0000 | ORAL_TABLET | Freq: Every day | ORAL | 1 refills | Status: DC | PRN

## 2022-05-29 MED ORDER — TRANEXAMIC ACID-NACL 1000-0.7 MG/100ML-% IV SOLN
1000.0000 mg | INTRAVENOUS | Status: AC
  Administered 2022-05-29: 1000 mg via INTRAVENOUS
  Filled 2022-05-29: qty 100

## 2022-05-29 MED ORDER — POVIDONE-IODINE 10 % EX SWAB: 2.0000 | Freq: Once | CUTANEOUS | Status: DC

## 2022-05-29 MED ORDER — FENTANYL CITRATE PF 50 MCG/ML IJ SOSY
25.0000 ug | PREFILLED_SYRINGE | INTRAMUSCULAR | Status: DC | PRN
  Administered 2022-05-29: 50 ug via INTRAVENOUS

## 2022-05-29 MED ORDER — AMISULPRIDE (ANTIEMETIC) 5 MG/2ML IV SOLN: 10.0000 mg | Freq: Once | INTRAVENOUS | Status: DC | PRN

## 2022-05-29 MED ORDER — BUPIVACAINE LIPOSOME 1.3 % IJ SUSP
INTRAMUSCULAR | Status: AC
  Filled 2022-05-29: qty 20

## 2022-05-29 MED ORDER — PROPOFOL 1000 MG/100ML IV EMUL
INTRAVENOUS | Status: AC
  Filled 2022-05-29: qty 100

## 2022-05-29 MED ORDER — LIDOCAINE 2% (20 MG/ML) 5 ML SYRINGE
INTRAMUSCULAR | Status: DC | PRN
  Administered 2022-05-29: 60 mg via INTRAVENOUS

## 2022-05-29 MED ORDER — ONDANSETRON 4 MG PO TBDP: 4.0000 mg | ORAL_TABLET | Freq: Three times a day (TID) | ORAL | 0 refills | Status: DC | PRN

## 2022-05-29 MED ORDER — MIDAZOLAM HCL 2 MG/2ML IJ SOLN
INTRAMUSCULAR | Status: AC
  Filled 2022-05-29: qty 2

## 2022-05-29 MED ORDER — LACTATED RINGERS IV BOLUS
500.0000 mL | Freq: Once | INTRAVENOUS | Status: AC
  Administered 2022-05-29: 500 mL via INTRAVENOUS

## 2022-05-29 MED ORDER — SODIUM CHLORIDE 0.9% FLUSH
INTRAVENOUS | Status: DC | PRN
  Administered 2022-05-29: 30 mL

## 2022-05-29 MED ORDER — BUPIVACAINE LIPOSOME 1.3 % IJ SUSP: 20.0000 mL | Freq: Once | INTRAMUSCULAR | Status: DC

## 2022-05-29 MED ORDER — BUPIVACAINE-EPINEPHRINE 0.25% -1:200000 IJ SOLN
INTRAMUSCULAR | Status: DC | PRN
  Administered 2022-05-29: 20 mL

## 2022-05-29 MED ORDER — CEFAZOLIN SODIUM-DEXTROSE 2-4 GM/100ML-% IV SOLN
2.0000 g | INTRAVENOUS | Status: AC
  Administered 2022-05-29: 2 g via INTRAVENOUS
  Filled 2022-05-29: qty 100

## 2022-05-29 MED ORDER — OXYCODONE HCL 5 MG/5ML PO SOLN: 5.0000 mg | Freq: Once | ORAL | Status: AC | PRN

## 2022-05-29 MED ORDER — BUPIVACAINE LIPOSOME 1.3 % IJ SUSP
INTRAMUSCULAR | Status: DC | PRN
  Administered 2022-05-29: 20 mL

## 2022-05-29 MED ORDER — FENTANYL CITRATE (PF) 100 MCG/2ML IJ SOLN
INTRAMUSCULAR | Status: DC | PRN
  Administered 2022-05-29 (×2): 50 ug via INTRAVENOUS

## 2022-05-29 MED ORDER — OXYCODONE HCL 5 MG PO TABS
ORAL_TABLET | ORAL | Status: AC
  Filled 2022-05-29: qty 1

## 2022-05-29 MED ORDER — PROPOFOL 1000 MG/100ML IV EMUL
INTRAVENOUS | Status: AC
  Filled 2022-05-29: qty 200

## 2022-05-29 MED ORDER — SODIUM CHLORIDE (PF) 0.9 % IJ SOLN
INTRAMUSCULAR | Status: AC
  Filled 2022-05-29: qty 30

## 2022-05-29 MED ORDER — FENTANYL CITRATE PF 50 MCG/ML IJ SOSY
PREFILLED_SYRINGE | INTRAMUSCULAR | Status: AC
  Administered 2022-05-29: 50 ug via INTRAVENOUS
  Filled 2022-05-29: qty 2

## 2022-05-29 MED ORDER — DEXMEDETOMIDINE HCL IN NACL 80 MCG/20ML IV SOLN
INTRAVENOUS | Status: AC
  Filled 2022-05-29: qty 20

## 2022-05-29 MED ORDER — ONDANSETRON HCL 4 MG/2ML IJ SOLN
INTRAMUSCULAR | Status: AC
  Filled 2022-05-29: qty 8

## 2022-05-29 MED ORDER — LACTATED RINGERS IV SOLN: INTRAVENOUS | Status: DC

## 2022-05-29 MED ORDER — ACETAMINOPHEN 500 MG PO TABS: 1000.0000 mg | ORAL_TABLET | Freq: Four times a day (QID) | ORAL | 0 refills | Status: DC | PRN

## 2022-05-29 MED ORDER — PROPOFOL 10 MG/ML IV BOLUS
INTRAVENOUS | Status: AC
  Filled 2022-05-29: qty 20

## 2022-05-29 MED ORDER — 0.9 % SODIUM CHLORIDE (POUR BTL) OPTIME
TOPICAL | Status: DC | PRN
  Administered 2022-05-29: 1000 mL

## 2022-05-29 MED ORDER — DEXAMETHASONE SODIUM PHOSPHATE 10 MG/ML IJ SOLN
INTRAMUSCULAR | Status: AC
  Filled 2022-05-29: qty 4

## 2022-05-29 MED ORDER — ACETAMINOPHEN 500 MG PO TABS
1000.0000 mg | ORAL_TABLET | Freq: Once | ORAL | Status: AC
  Administered 2022-05-29: 1000 mg via ORAL

## 2022-05-29 MED ORDER — METHOCARBAMOL 500 MG PO TABS: 500.0000 mg | ORAL_TABLET | Freq: Four times a day (QID) | ORAL | Status: DC | PRN

## 2022-05-29 MED ORDER — ACETAMINOPHEN 500 MG PO TABS
1000.0000 mg | ORAL_TABLET | Freq: Once | ORAL | Status: DC
  Filled 2022-05-29: qty 2

## 2022-05-29 MED ORDER — OXYCODONE HCL 5 MG PO TABS
5.0000 mg | ORAL_TABLET | Freq: Once | ORAL | Status: AC | PRN
  Administered 2022-05-29: 5 mg via ORAL

## 2022-05-29 MED ORDER — ROPIVACAINE HCL 5 MG/ML IJ SOLN
INTRAMUSCULAR | Status: DC | PRN
  Administered 2022-05-29: 20 mL via PERINEURAL

## 2022-05-29 MED ORDER — METHOCARBAMOL 500 MG IVPB - SIMPLE MED
500.0000 mg | Freq: Four times a day (QID) | INTRAVENOUS | Status: DC | PRN
  Administered 2022-05-29: 500 mg via INTRAVENOUS

## 2022-05-29 MED ORDER — EPHEDRINE 5 MG/ML INJ
INTRAVENOUS | Status: AC
  Filled 2022-05-29: qty 5

## 2022-05-29 MED ORDER — METHOCARBAMOL 500 MG IVPB - SIMPLE MED
INTRAVENOUS | Status: AC
  Filled 2022-05-29: qty 55

## 2022-05-29 SURGICAL SUPPLY — 55 items
BAG COUNTER SPONGE SURGICOUNT (BAG) IMPLANT
BAG SPNG CNTER NS LX DISP (BAG)
BLADE HEX COATED 2.75 (ELECTRODE) ×1 IMPLANT
BLADE SAG 18X100X1.27 (BLADE) ×1 IMPLANT
BLADE SAGITTAL 25.0X1.37X90 (BLADE) ×1 IMPLANT
BLADE SURG 15 STRL LF DISP TIS (BLADE) ×1 IMPLANT
BLADE SURG 15 STRL SS (BLADE) ×1
BLADE SURG SZ10 CARB STEEL (BLADE) IMPLANT
BNDG CMPR MED 10X6 ELC LF (GAUZE/BANDAGES/DRESSINGS) ×1
BNDG ELASTIC 6X10 VLCR STRL LF (GAUZE/BANDAGES/DRESSINGS) ×1 IMPLANT
BOWL SMART MIX CTS (DISPOSABLE) IMPLANT
BSPLAT TIB 5 KN TRITANIUM (Knees) ×1 IMPLANT
CLSR STERI-STRIP ANTIMIC 1/2X4 (GAUZE/BANDAGES/DRESSINGS) ×1 IMPLANT
COMPONENT TRI CR FEM SZ5 KNEE (Orthopedic Implant) IMPLANT
COVER SURGICAL LIGHT HANDLE (MISCELLANEOUS) ×1 IMPLANT
CUFF TOURN SGL QUICK 34 (TOURNIQUET CUFF) ×1
CUFF TRNQT CYL 34X4.125X (TOURNIQUET CUFF) ×1 IMPLANT
DRAPE U-SHAPE 47X51 STRL (DRAPES) ×1 IMPLANT
DRSG MEPILEX POST OP 4X12 (GAUZE/BANDAGES/DRESSINGS) ×1 IMPLANT
DURAPREP 26ML APPLICATOR (WOUND CARE) ×2 IMPLANT
GLOVE BIO SURGEON STRL SZ7.5 (GLOVE) ×2 IMPLANT
GLOVE BIOGEL PI IND STRL 7.5 (GLOVE) ×1 IMPLANT
GLOVE BIOGEL PI IND STRL 8 (GLOVE) ×1 IMPLANT
GLOVE SURG SYN 7.5  E (GLOVE) ×1
GLOVE SURG SYN 7.5 E (GLOVE) ×1 IMPLANT
GLOVE SURG SYN 7.5 PF PI (GLOVE) ×1 IMPLANT
GOWN STRL REUS W/ TWL LRG LVL3 (GOWN DISPOSABLE) ×1 IMPLANT
GOWN STRL REUS W/ TWL XL LVL3 (GOWN DISPOSABLE) ×1 IMPLANT
GOWN STRL REUS W/TWL LRG LVL3 (GOWN DISPOSABLE) ×1
GOWN STRL REUS W/TWL XL LVL3 (GOWN DISPOSABLE) ×1
HANDPIECE INTERPULSE COAX TIP (DISPOSABLE) ×1
HOLDER FOLEY CATH W/STRAP (MISCELLANEOUS) IMPLANT
IMMOBILIZER KNEE 22 UNIV (SOFTGOODS) IMPLANT
INSERT TIB BEARING X3 9 SZ5 (Insert) IMPLANT
KIT TURNOVER KIT A (KITS) IMPLANT
KNEE PATELLA ASYMMETRIC 10X32 (Knees) IMPLANT
KNEE TIBIAL COMPONENT SZ5 (Knees) IMPLANT
MANIFOLD NEPTUNE II (INSTRUMENTS) ×1 IMPLANT
NS IRRIG 1000ML POUR BTL (IV SOLUTION) ×1 IMPLANT
PACK ICE MAXI GEL EZY WRAP (MISCELLANEOUS) IMPLANT
PACK TOTAL KNEE CUSTOM (KITS) ×1 IMPLANT
PIN FLUTED HEDLESS FIX 3.5X1/8 (PIN) IMPLANT
PROTECTOR NERVE ULNAR (MISCELLANEOUS) ×1 IMPLANT
SET HNDPC FAN SPRY TIP SCT (DISPOSABLE) ×1 IMPLANT
SPIKE FLUID TRANSFER (MISCELLANEOUS) ×1 IMPLANT
SUT MNCRL AB 3-0 PS2 18 (SUTURE) ×1 IMPLANT
SUT VIC AB 0 CT1 36 (SUTURE) ×1 IMPLANT
SUT VIC AB 1 CT1 36 (SUTURE) ×2 IMPLANT
SUT VIC AB 2-0 CT1 27 (SUTURE) ×1
SUT VIC AB 2-0 CT1 TAPERPNT 27 (SUTURE) ×1 IMPLANT
TRAY FOLEY MTR SLVR 14FR STAT (SET/KITS/TRAYS/PACK) IMPLANT
TRAY FOLEY MTR SLVR 16FR STAT (SET/KITS/TRAYS/PACK) IMPLANT
TRI CRUC RET FEM SZ5 KNEE (Orthopedic Implant) ×1 IMPLANT
TUBE SUCTION HIGH CAP CLEAR NV (SUCTIONS) ×1 IMPLANT
WRAP KNEE MAXI GEL POST OP (GAUZE/BANDAGES/DRESSINGS) ×1 IMPLANT

## 2022-05-29 NOTE — Anesthesia Postprocedure Evaluation (Signed)
Anesthesia Post Note  Patient: Michael Hodge.  Procedure(s) Performed: TOTAL KNEE ARTHROPLASTY (Right: Knee)     Patient location during evaluation: PACU Anesthesia Type: Regional, Spinal and MAC Level of consciousness: awake Pain management: pain level controlled Vital Signs Assessment: post-procedure vital signs reviewed and stable Respiratory status: spontaneous breathing, respiratory function stable and nonlabored ventilation Cardiovascular status: blood pressure returned to baseline and stable Postop Assessment: no headache, no backache and no apparent nausea or vomiting Anesthetic complications: no   No notable events documented.  Last Vitals:  Vitals:   05/29/22 1100 05/29/22 1107  BP: 136/79 136/79  Pulse:  (!) 43  Resp:    Temp: (!) 36.4 C   SpO2: 98% 98%    Last Pain:  Vitals:   05/29/22 1107  PainSc: 4                  Nilda Simmer

## 2022-05-29 NOTE — Evaluation (Signed)
Physical Therapy Evaluation Patient Details Name: Michael Hodge. MRN: [id number] DOB: [birth year] Today's Date: 05/29/2022  History of Present Illness  72 yo male presents to therapy s/p R TKA on 05/29/2022 secondary to failure of conservative measures. Pt PMH includes but is not limited to: a-fib, HTN, HDL, CAD, and DM II.  Clinical Impression    Danley Alarcon. is a 72 y.o. male POD 0 s/p R TKA. Patient reports IND with mobility at baseline. Patient is now limited by functional impairments (see PT problem list below) and requires min guard and cues for transfers and gait with RW. Patient was able to ambulate 30 and 25 feet with RW and min guard and cues for safe walker management. Patient educated on safe sequencing for stair mobility, use of ice/CP, pain management with pt and wife verbalized  understanding of safe guarding position for people assisting with mobility. Patient instructed in exercises to facilitate ROM and circulation reviewed with HO provided. Patient will benefit from continued skilled PT interventions to address impairments and progress towards PLOF. Patient has met mobility goals at adequate level for discharge home with family support and pt states will start OPPT on Thursday 4/4 will continue to follow if pt continues acute stay to progress towards Mod I goals.      Recommendations for follow up therapy are one component of a multi-disciplinary discharge planning process, led by the attending physician.  Recommendations may be updated based on patient status, additional functional criteria and insurance authorization.  Follow Up Recommendations       Assistance Recommended at Discharge Intermittent Supervision/Assistance  Patient can return home with the following  A little help with walking and/or transfers;A little help with bathing/dressing/bathroom;Assistance with cooking/housework;Assist for transportation;Help with stairs or ramp for entrance    Equipment  Recommendations None recommended by PT (pt reports having DME in home setting)  Recommendations for Other Services       Functional Status Assessment Patient has had a recent decline in their functional status and demonstrates the ability to make significant improvements in function in a reasonable and predictable amount of time.     Precautions / Restrictions Precautions Precautions: Knee;Fall Restrictions Weight Bearing Restrictions: No      Mobility  Bed Mobility Overal bed mobility: Needs Assistance Bed Mobility: Supine to Sit     Supine to sit: Min guard          Transfers Overall transfer level: Needs assistance Equipment used: Rolling walker (2 wheels) Transfers: Sit to/from Stand Sit to Stand: Min guard           General transfer comment: cues for proper UE and AD placement    Ambulation/Gait Ambulation/Gait assistance: Min guard Gait Distance (Feet): 30 Feet Assistive device: Rolling walker (2 wheels) Gait Pattern/deviations: Step-to pattern, Trunk flexed (B toe out) Gait velocity: decreased        Stairs Stairs: Yes Stairs assistance: Min assist Stair Management: No rails Number of Stairs: 2 General stair comments: min gaurd and cues for steps with B handrail. pt reports he will use the recycling can for stability, PT suggested use of walking stick and wife to provide HHA  Wheelchair Mobility    Modified Rankin (Stroke Patients Only)       Balance Overall balance assessment: Needs assistance Sitting-balance support: Feet supported Sitting balance-Leahy Scale: Fair     Standing balance support: Bilateral upper extremity supported, During functional activity, Reliant on assistive device for balance Standing balance-Leahy Scale: Poor  Pertinent Vitals/Pain Pain Assessment Pain Assessment: 0-10 Pain Score: 5  Pain Location: R Knee Pain Descriptors / Indicators: Constant, Discomfort, Operative  site guarding Pain Intervention(s): Monitored during session, Limited activity within patient's tolerance, Premedicated before session, Ice applied, Repositioned    Home Living Family/patient expects to be discharged to:: Private residence Living Arrangements: Spouse/significant other Available Help at Discharge: Family Type of Home: House Home Access: Stairs to enter Entrance Stairs-Rails: None Entrance Stairs-Number of Steps: 3 Alternate Level Stairs-Number of Steps: 14 (with landing) Home Layout: Multi-level (B and B on second floor) Home Equipment: Rolling Walker (2 wheels);Cane - single point      Prior Function Prior Level of Function : Independent/Modified Independent;Driving             Mobility Comments: IND with all ADLs, self caer tasks, IADLs and driving       Hand Dominance        Extremity/Trunk Assessment        Lower Extremity Assessment Lower Extremity Assessment: RLE deficits/detail RLE Deficits / Details: ankle DF/PF 5/5: SLR < 10 degree lag RLE Sensation: WNL    Cervical / Trunk Assessment Cervical / Trunk Assessment: Other exceptions (cervical flexion able to modify posture with cues)  Communication   Communication: No difficulties  Cognition Arousal/Alertness: Awake/alert Behavior During Therapy: WFL for tasks assessed/performed Overall Cognitive Status: Within Functional Limits for tasks assessed                                          General Comments      Exercises Total Joint Exercises Ankle Circles/Pumps: AROM, Both, 20 reps Quad Sets: AROM, Right, 5 reps Heel Slides: AROM, Right, 5 reps Hip ABduction/ADduction: AROM, Right, 5 reps Straight Leg Raises: AROM, Right, 5 reps Long Arc Quad: AROM, Right, 5 reps   Assessment/Plan    PT Assessment Patient needs continued PT services  PT Problem List Decreased strength;Decreased range of motion;Decreased activity tolerance;Decreased balance;Decreased  mobility;Decreased coordination;Decreased knowledge of use of DME;Pain       PT Treatment Interventions DME instruction;Gait training;Stair training;Functional mobility training;Therapeutic activities;Therapeutic exercise;Balance training;Neuromuscular re-education;Patient/family education;Modalities    PT Goals (Current goals can be found in the Care Plan section)  Acute Rehab PT Goals Patient Stated Goal: to be able to amb without R knee instabiltiy and be more active PT Goal Formulation: With patient Time For Goal Achievement: 06/12/22 Potential to Achieve Goals: Good    Frequency       Co-evaluation               AM-PAC PT "6 Clicks" Mobility  Outcome Measure Help needed turning from your back to your side while in a flat bed without using bedrails?: A Little Help needed moving from lying on your back to sitting on the side of a flat bed without using bedrails?: A Little Help needed moving to and from a bed to a chair (including a wheelchair)?: A Little Help needed standing up from a chair using your arms (e.g., wheelchair or bedside chair)?: A Little Help needed to walk in hospital room?: A Little Help needed climbing 3-5 steps with a railing? : A Little 6 Click Score: 18    End of Session Equipment Utilized During Treatment: Gait belt Activity Tolerance: Patient tolerated treatment well Patient left: in chair;with call bell/phone within reach;with family/visitor present Nurse Communication: Mobility status;Other (comment) (progression toward d/c)  PT Visit Diagnosis: Unsteadiness on feet (R26.81);Other abnormalities of gait and mobility (R26.89);Muscle weakness (generalized) (M62.81);Difficulty in walking, not elsewhere classified (R26.2);Pain Pain - Right/Left: Right Pain - part of body: Knee    Time: 1205-1254 PT Time Calculation (min) (ACUTE ONLY): 49 min   Charges:   PT Evaluation $PT Eval Low Complexity: 1 Low PT Treatments $Gait Training: 8-22  mins $Therapeutic Exercise: 8-22 mins        Baird Lyons, PT   Adair Patter 05/29/2022, 2:30 PM

## 2022-05-29 NOTE — Interval H&P Note (Signed)
History and Physical Interval Note:  05/29/2022 6:23 AM  Michael Hodge.  has presented today for surgery, with the diagnosis of OA RIGHT KNEE.  The various methods of treatment have been discussed with the patient and family. After consideration of risks, benefits and other options for treatment, the patient has consented to  Procedure(s): TOTAL KNEE ARTHROPLASTY (Right) as a surgical intervention.  The patient's history has been reviewed, patient examined, no change in status, stable for surgery.  I have reviewed the patient's chart and labs.  Questions were answered to the patient's satisfaction.     Renette Butters

## 2022-05-29 NOTE — Anesthesia Procedure Notes (Signed)
Anesthesia Regional Block: Adductor canal block   Pre-Anesthetic Checklist: , timeout performed,  Correct Patient, Correct Site, Correct Laterality,  Correct Procedure, Correct Position, site marked,  Risks and benefits discussed,  Surgical consent,  Pre-op evaluation,  At surgeon's request and post-op pain management  Laterality: Right  Prep: chloraprep       Needles:  Injection technique: Single-shot  Needle Type: Echogenic Stimulator Needle     Needle Length: 9cm  Needle Gauge: 21     Additional Needles:   Procedures:,,,, ultrasound used (permanent image in chart),,    Narrative:  Start time: 05/29/2022 6:59 AM End time: 05/29/2022 7:01 AM Injection made incrementally with aspirations every 5 mL.  Performed by: Personally  Anesthesiologist: Nilda Simmer, MD  Additional Notes: Discussed risks and benefits of nerve block including, but not limited to, prolonged and/or permanent nerve injury involving sensory and/or motor function. Monitors were applied and a time-out was performed. The nerve and associated structures were visualized under ultrasound guidance. After negative aspiration, local anesthetic was slowly injected around the nerve. There was no evidence of high pressure during the procedure. There were no paresthesias. VSS remained stable and the patient tolerated the procedure well.

## 2022-05-29 NOTE — Anesthesia Procedure Notes (Signed)
Spinal  Patient location during procedure: OR Start time: 05/29/2022 7:20 AM Reason for block: surgical anesthesia Staffing Performed: resident/CRNA  Anesthesiologist: Nilda Simmer, MD Resident/CRNA: Gerald Leitz, CRNA Performed by: Gerald Leitz, CRNA Authorized by: Nilda Simmer, MD   Preanesthetic Checklist Completed: patient identified, IV checked, site marked, risks and benefits discussed, surgical consent, monitors and equipment checked, pre-op evaluation and timeout performed Spinal Block Patient position: sitting Prep: DuraPrep Patient monitoring: heart rate, continuous pulse ox, blood pressure and cardiac monitor Approach: midline Location: L3-4 Injection technique: single-shot Needle Needle type: Introducer and Pencan  Needle gauge: 25 G Needle length: 9 cm Assessment Sensory level: T4 Events: CSF return Additional Notes Negative paresthesia. Negative blood return. Positive free-flowing CSF. Expiration date of kit checked and confirmed. Patient tolerated procedure well, without complications.

## 2022-05-29 NOTE — Discharge Instructions (Addendum)

## 2022-05-29 NOTE — Transfer of Care (Signed)
Immediate Anesthesia Transfer of Care Note  Patient: Michael Hodge.  Procedure(s) Performed: Procedure(s): TOTAL KNEE ARTHROPLASTY (Right)  Patient Location: PACU  Anesthesia Type:Spinal  Level of Consciousness: awake, alert  and oriented  Airway & Oxygen Therapy: Patient Spontanous Breathing  Post-op Assessment: Report given to RN and Post -op Vital signs reviewed and stable  Post vital signs: Reviewed and stable  Last Vitals:  Vitals:   05/29/22 0546  BP: 136/67  Pulse: 64  Resp: 18  Temp: 36.9 C  SpO2: [id number]    Complications: No apparent anesthesia complications

## 2022-05-29 NOTE — Op Note (Signed)
DATE OF SURGERY:  05/29/2022 TIME: 8:49 AM  PATIENT NAME:  Michael Hodge.   AGE: 72 y.o.    PRE-OPERATIVE DIAGNOSIS:  OA RIGHT KNEE  POST-OPERATIVE DIAGNOSIS:  Same  PROCEDURE:  Procedure(s): TOTAL KNEE ARTHROPLASTY   SURGEON:  Renette Butters, MD   ASSISTANT:  Aggie Moats, PA-C, she was present and scrubbed throughout the case, critical for completion in a timely fashion, and for retraction, instrumentation, and closure.    OPERATIVE IMPLANTS: [employer] Triathlon CR. Press fit knee  Femur size 5, Tibia size 5, Patella size 32 3-peg oval button, with a 9 mm polyethylene insert.   PREOPERATIVE INDICATIONS:  Michael Hodge. is a 72 y.o. year old male with end stage bone on bone degenerative arthritis of the knee who failed conservative treatment, including injections, antiinflammatories, activity modification, and assistive devices, and had significant impairment of their activities of daily living, and elected for Total Knee Arthroplasty.   The risks, benefits, and alternatives were discussed at length including but not limited to the risks of infection, bleeding, nerve injury, stiffness, blood clots, the need for revision surgery, cardiopulmonary complications, among others, and they were willing to proceed.   OPERATIVE DESCRIPTION:  The patient was brought to the operative room and placed in a supine position.  General anesthesia was administered.  IV antibiotics were given.  The lower extremity was prepped and draped in the usual sterile fashion.  Time out was performed.  The leg was elevated and exsanguinated and the tourniquet was inflated.  Anterior approach was performed.  The patella was everted and osteophytes were removed.  The anterior horn of the medial and lateral meniscus was removed.   The distal femur was opened with the drill and the intramedullary distal femoral cutting jig was utilized, set at 5 degrees resecting 9 mm off the distal femur.  Care was taken to  protect the collateral ligaments.  The distal femoral sizing jig was applied, taking care to avoid notching.  Then the 4-in-1 cutting jig was applied and the anterior and posterior femur was cut, along with the chamfer cuts.  All posterior osteophytes were removed.  The flexion gap was then measured and was symmetric with the extension gap.  Then the extramedullary tibial cutting jig was utilized making the appropriate cut using the anterior tibial crest as a reference building in appropriate posterior slope.  Care was taken during the cut to protect the medial and collateral ligaments.  The proximal tibia was removed along with the posterior horns of the menisci.    I completed the distal femoral preparation using the appropriate jig to prepare the box.  The patella was then measured, and cut with the saw.    The proximal tibia sized and prepared accordingly with the reamer and the punch, and then all components were trialed with the above sized poly insert.  The knee was found to have excellent balance and full motion.    The above named components were then impacted into place and Poly tibial piece and patella were inserted.  I was very happy with his stability and ROM  I performed a periarticular injection with Exparel  The knee was easily taken through a range of motion and the patella tracked well and the knee irrigated copiously and the parapatellar and subcutaneous tissue closed with vicryl, and monocryl with steri strips for the skin.  The incision was dressed with sterile gauze and the tourniquet released and the patient was awakened and returned  to the PACU in stable and satisfactory condition.  There were no complications.  Total tourniquet time was roughly 75 minutes.   POSTOPERATIVE PLAN: post op Abx, DVT px: SCD's, TED's, Early ambulation and chemical px

## 2022-05-31 ENCOUNTER — Encounter (HOSPITAL_COMMUNITY): Payer: Self-pay | Admitting: Orthopedic Surgery

## 2022-05-31 DIAGNOSIS — M6281 Muscle weakness (generalized): Secondary | ICD-10-CM | POA: Diagnosis not present

## 2022-05-31 DIAGNOSIS — R262 Difficulty in walking, not elsewhere classified: Secondary | ICD-10-CM | POA: Diagnosis not present

## 2022-05-31 DIAGNOSIS — M1711 Unilateral primary osteoarthritis, right knee: Secondary | ICD-10-CM | POA: Diagnosis not present

## 2022-05-31 DIAGNOSIS — M25661 Stiffness of right knee, not elsewhere classified: Secondary | ICD-10-CM | POA: Diagnosis not present

## 2022-06-04 DIAGNOSIS — M1711 Unilateral primary osteoarthritis, right knee: Secondary | ICD-10-CM | POA: Diagnosis not present

## 2022-06-04 DIAGNOSIS — R262 Difficulty in walking, not elsewhere classified: Secondary | ICD-10-CM | POA: Diagnosis not present

## 2022-06-04 DIAGNOSIS — M6281 Muscle weakness (generalized): Secondary | ICD-10-CM | POA: Diagnosis not present

## 2022-06-04 DIAGNOSIS — M25661 Stiffness of right knee, not elsewhere classified: Secondary | ICD-10-CM | POA: Diagnosis not present

## 2022-06-07 DIAGNOSIS — M25661 Stiffness of right knee, not elsewhere classified: Secondary | ICD-10-CM | POA: Diagnosis not present

## 2022-06-07 DIAGNOSIS — M1711 Unilateral primary osteoarthritis, right knee: Secondary | ICD-10-CM | POA: Diagnosis not present

## 2022-06-07 DIAGNOSIS — M6281 Muscle weakness (generalized): Secondary | ICD-10-CM | POA: Diagnosis not present

## 2022-06-07 DIAGNOSIS — R262 Difficulty in walking, not elsewhere classified: Secondary | ICD-10-CM | POA: Diagnosis not present

## 2022-06-11 DIAGNOSIS — R262 Difficulty in walking, not elsewhere classified: Secondary | ICD-10-CM | POA: Diagnosis not present

## 2022-06-11 DIAGNOSIS — M1711 Unilateral primary osteoarthritis, right knee: Secondary | ICD-10-CM | POA: Diagnosis not present

## 2022-06-11 DIAGNOSIS — M25661 Stiffness of right knee, not elsewhere classified: Secondary | ICD-10-CM | POA: Diagnosis not present

## 2022-06-11 DIAGNOSIS — M6281 Muscle weakness (generalized): Secondary | ICD-10-CM | POA: Diagnosis not present

## 2022-06-12 ENCOUNTER — Ambulatory Visit: Payer: Medicare PPO | Admitting: Family Medicine

## 2022-06-12 ENCOUNTER — Encounter: Payer: Self-pay | Admitting: Family Medicine

## 2022-06-12 VITALS — BP 135/76 | HR 74 | Temp 97.8°F | Ht 66.0 in | Wt 189.0 lb

## 2022-06-12 DIAGNOSIS — R310 Gross hematuria: Secondary | ICD-10-CM | POA: Diagnosis not present

## 2022-06-12 DIAGNOSIS — N3001 Acute cystitis with hematuria: Secondary | ICD-10-CM | POA: Diagnosis not present

## 2022-06-12 DIAGNOSIS — Z7901 Long term (current) use of anticoagulants: Secondary | ICD-10-CM

## 2022-06-12 LAB — POC URINALSYSI DIPSTICK (AUTOMATED)
Bilirubin, UA: NEGATIVE
Glucose, UA: NEGATIVE
Ketones, UA: NEGATIVE
Leukocytes, UA: NEGATIVE
Nitrite, UA: NEGATIVE
Protein, UA: NEGATIVE
Spec Grav, UA: >=1.03 — AB (ref 1.010–1.025)
Urobilinogen, UA: 1 E.U./dL
pH, UA: 6 (ref 5.0–8.0)

## 2022-06-12 MED ORDER — SULFAMETHOXAZOLE-TRIMETHOPRIM 800-160 MG PO TABS: 1.0000 | ORAL_TABLET | Freq: Two times a day (BID) | ORAL | 0 refills | Status: DC

## 2022-06-12 NOTE — Telephone Encounter (Signed)
Patient returned call regarding appt for today.  Scheduled patient at 11:20am per Dr. Milinda Cave

## 2022-06-12 NOTE — Telephone Encounter (Signed)
LVM for pt to return call regarding available slot today (11:20). If call returned, please schedule.

## 2022-06-12 NOTE — Progress Notes (Signed)
OFFICE VISIT  06/12/2022  CC:  Chief Complaint  Patient presents with   Urinary Concern    Patient is a 72 y.o. male who presents for blood in urine.  HPI: A couple of days ago-year-old noted some bloody spots on underwear.  Same yesterday.  This morning he had a episode of gross hematuria. He got a right total knee arthroplasty on 05/29/2022, almost certainly had Foley cath and surgery. Initially at right after the surgery he did feel significant urinary urgency but this has resolved.  He does still feel some urinary frequency and nocturia. He does have some morning fatigue but this gets better in the afternoons.  No fever, no flank pain, no abdominal pain, no nausea, no dysuria.  Past Medical History:  Diagnosis Date   Atrial fibrillation    11/2021   CAD (coronary artery disease)    CT demonstrated nonobstructive plaques (this test was prompted by borderline ETT.   Diabetes mellitus without complication dx'd 08/2011   Fasting gluc 136 and HbA1c 6.7% at diagnosis.    Diverticulosis    GERD (gastroesophageal reflux disease)    occ   Hepatic steatosis 07/2020   noted on CT abd/pelv done for GE illness   History of colitis 08/06/2020   presumed infectious. CT abd/pelv w/contrast at [facility]->long segment circumferential wall thickening of descending colon   History of nonmelanoma skin cancer    BCC   Hx of adenomatous colonic polyps    Hyperlipidemia    Hypertension    w/hypertensive retinopathy   Internal hemorrhoids    Obesity, Class II, BMI 35-39.9    Osteoarthritis of both knees    mod to severe 2022, steroid injections by ortho helped some.  Visco injections on the right --no significant help.    Past Surgical History:  Procedure Laterality Date   CARDIOVASCULAR STRESS TEST  02/2013   2015 LexiScan/low-level exercise-Myoview (02/2013): No ischemia, EF 53%, occ symptomatic PVCs.  03/2022 NORMAL/LOW RISK   COLONOSCOPY  02/2021   2023 normal.  Recall 5 yrs    COLONOSCOPY W/ POLYPECTOMY  2005;08/21/10;11/29/15   adenomatous; also mild diverticulosis; recall 2017.  iFOB neg 09/2011.  11/2015: tubular adenoma x 1. 02/2021 normal->recall 5yrs   HAMMER TOE SURGERY Right 05/24/2020   MOHS SURGERY  2018   head, and 2021 on Lt cheek   remvoal sebaceous cyst  15-20 years ago   TONSILLECTOMY     as a child   TOTAL KNEE ARTHROPLASTY Right 05/29/2022   Procedure: TOTAL KNEE ARTHROPLASTY;  Surgeon: Sheral Apley, MD;  Location: [facility];  Service: Orthopedics;  Laterality: Right;   TRANSTHORACIC ECHOCARDIOGRAM     12/2021 EF 55-60%, grd I DD, mild LAE    Outpatient Medications Prior to Visit  Medication Sig Dispense Refill   acetaminophen (TYLENOL) 500 MG tablet Take 2 tablets (1,000 mg total) by mouth every 6 (six) hours as needed for mild pain or moderate pain. 60 tablet 0   apixaban (ELIQUIS) 5 MG TABS tablet Take 1 tablet (5 mg total) by mouth 2 (two) times daily. 60 tablet 6   atorvastatin (LIPITOR) 40 MG tablet Take 1 tablet (40 mg total) by mouth daily. 90 tablet 3   diclofenac Sodium (VOLTAREN) 1 % GEL Apply 4 g topically 4 (four) times daily. (Patient taking differently: Apply 4 g topically daily as needed (pain).) 100 g 3   glucose blood (FREESTYLE LITE) test strip USE TO CHECK BLOOD SUGAR 1-2 TIMES DAILY AS INSTRUCTED. 50  each 3   hydrochlorothiazide (HYDRODIURIL) 25 MG tablet Take 1 tablet (25 mg total) by mouth daily. 90 tablet 3   metFORMIN (GLUCOPHAGE XR) 500 MG 24 hr tablet 2 tabs po daily with supper 180 tablet 3   methocarbamol (ROBAXIN-750) 750 MG tablet Take 1 tablet (750 mg total) by mouth every 8 (eight) hours as needed for muscle spasms. 20 tablet 0   metoprolol tartrate (LOPRESSOR) 25 MG tablet Take 1 tablet (25 mg total) by mouth as needed (racing heart beat). Take as needed for racing heart beat 60 tablet 1   mupirocin ointment (BACTROBAN) 2 % Apply topically.     ondansetron (ZOFRAN-ODT) 4 MG disintegrating tablet Take 1 tablet (4 mg  total) by mouth every 8 (eight) hours as needed for nausea or vomiting. 15 tablet 0   oxyCODONE (ROXICODONE) 5 MG immediate release tablet Take 1 tablet (5 mg total) by mouth every 4 (four) hours as needed for severe pain. 35 tablet 0   potassium chloride SA (KLOR-CON M20) 20 MEQ tablet Take 2 tablets (40 mEq total) by mouth 2 (two) times daily. 360 tablet 3   sennosides-docusate sodium (SENOKOT-S) 8.6-50 MG tablet Take 2 tablets by mouth daily as needed for constipation (while taking narcotics). 30 tablet 1   sildenafil (VIAGRA) 100 MG tablet 1/2-1 tab po qd as needed.  Take 30-60 min prior to intercourse 15 tablet 6   traMADol (ULTRAM) 50 MG tablet Take 50-100 mg by mouth every 6 (six) hours as needed.     No facility-administered medications prior to visit.    No Known Allergies  Review of Systems  As per HPI  PE:    06/12/2022   11:42 AM 05/29/2022   12:10 PM 05/29/2022   11:07 AM  Vitals with BMI  Height     Weight 189 lbs    BMI 30.52    Systolic 135 138 161  Diastolic 76 73 79  Pulse 74 50 43     Physical Exam  Gen: Alert, well appearing.  Patient is oriented to person, place, time, and situation. AFFECT: pleasant, lucid thought and speech. No further exam today  LABS:  Last CBC Lab Results  Component Value Date   WBC 7.1 05/17/2022   HGB 13.0 05/17/2022   HCT 40.5 05/17/2022   MCV 91.6 05/17/2022   MCH 29.4 05/17/2022   RDW 14.6 05/17/2022   PLT 184 05/17/2022   Last metabolic panel Lab Results  Component Value Date   GLUCOSE 123 (H) 05/17/2022   NA 137 05/17/2022   K 3.6 05/17/2022   CL 101 05/17/2022   CO2 27 05/17/2022   BUN 21 05/17/2022   CREATININE 0.94 05/17/2022   GFRNONAA >60 05/17/2022   CALCIUM 9.5 05/17/2022   PROT 6.6 12/22/2021   ALBUMIN 4.4 12/22/2021   BILITOT 1.0 12/22/2021   ALKPHOS 61 12/22/2021   AST 22 12/22/2021   ALT 22 12/22/2021   ANIONGAP 9 05/17/2022   IMPRESSION AND PLAN:  #1 gross hematuria.  Recent history of  bladder cath.  Patient is on Eliquis for stroke prophylaxis. UA today showed:  Malodorous and dark yellow,  2+ blood, otherwise normal.    Start Bactrim for suspected UTI.  Will culture urine specimen. Okay to continue Eliquis.  An After Visit Summary was printed and given to the patient.  FOLLOW UP: Return in about 1 week (around 06/19/2022) for f/u hematuria.  Signed:  Santiago Bumpers, MD  06/12/2022  

## 2022-06-12 NOTE — Telephone Encounter (Signed)
Can we get [place] in my 11:20 slot today?

## 2022-06-13 DIAGNOSIS — M1711 Unilateral primary osteoarthritis, right knee: Secondary | ICD-10-CM | POA: Diagnosis not present

## 2022-06-14 DIAGNOSIS — R262 Difficulty in walking, not elsewhere classified: Secondary | ICD-10-CM | POA: Diagnosis not present

## 2022-06-14 DIAGNOSIS — M1711 Unilateral primary osteoarthritis, right knee: Secondary | ICD-10-CM | POA: Diagnosis not present

## 2022-06-14 DIAGNOSIS — M6281 Muscle weakness (generalized): Secondary | ICD-10-CM | POA: Diagnosis not present

## 2022-06-14 DIAGNOSIS — M25661 Stiffness of right knee, not elsewhere classified: Secondary | ICD-10-CM | POA: Diagnosis not present

## 2022-06-15 ENCOUNTER — Encounter: Payer: Self-pay | Admitting: Family Medicine

## 2022-06-15 LAB — URINE CULTURE
MICRO NUMBER:: 14832067
SPECIMEN QUALITY:: ADEQUATE

## 2022-06-18 DIAGNOSIS — M6281 Muscle weakness (generalized): Secondary | ICD-10-CM | POA: Diagnosis not present

## 2022-06-18 DIAGNOSIS — M1711 Unilateral primary osteoarthritis, right knee: Secondary | ICD-10-CM | POA: Diagnosis not present

## 2022-06-18 DIAGNOSIS — R262 Difficulty in walking, not elsewhere classified: Secondary | ICD-10-CM | POA: Diagnosis not present

## 2022-06-18 DIAGNOSIS — M25661 Stiffness of right knee, not elsewhere classified: Secondary | ICD-10-CM | POA: Diagnosis not present

## 2022-06-20 ENCOUNTER — Ambulatory Visit: Payer: Medicare PPO | Admitting: Family Medicine

## 2022-06-20 ENCOUNTER — Encounter: Payer: Self-pay | Admitting: Family Medicine

## 2022-06-20 VITALS — BP 131/79 | HR 69 | Wt 186.6 lb

## 2022-06-20 DIAGNOSIS — M1711 Unilateral primary osteoarthritis, right knee: Secondary | ICD-10-CM | POA: Diagnosis not present

## 2022-06-20 DIAGNOSIS — N3001 Acute cystitis with hematuria: Secondary | ICD-10-CM | POA: Diagnosis not present

## 2022-06-20 DIAGNOSIS — M6281 Muscle weakness (generalized): Secondary | ICD-10-CM | POA: Diagnosis not present

## 2022-06-20 DIAGNOSIS — M25661 Stiffness of right knee, not elsewhere classified: Secondary | ICD-10-CM | POA: Diagnosis not present

## 2022-06-20 DIAGNOSIS — R262 Difficulty in walking, not elsewhere classified: Secondary | ICD-10-CM | POA: Diagnosis not present

## 2022-06-20 NOTE — Progress Notes (Signed)
OFFICE VISIT  06/20/2022  CC:  Chief Complaint  Patient presents with   Follow-up    Follow up. He just had right knee surgery 3 weeks ago.    Patient is a 72 y.o. male who presents for 1 week follow-up UTI. A/P as of last visit: "#1 gross hematuria.  Recent history of bladder cath.  Patient is on Eliquis for stroke prophylaxis. UA today showed:  Malodorous and dark yellow,  2+ blood, otherwise normal.    Start Bactrim for suspected UTI.  Will culture urine specimen. Okay to continue Eliquis."  INTERIM HX: Urine culture positive for E. coli resistant to penicillins and first generation cephalosporin, otherwise sensitive. Also positive for Enterococcus faecalis sensitive to ampicillin and nitrofurantoin.  He has had no further dysuria.  He has no urgency, frequency, dysuria, suprapubic pain, or flank pain.  No fevers.  Past Medical History:  Diagnosis Date   Atrial fibrillation    11/2021   CAD (coronary artery disease)    CT demonstrated nonobstructive plaques (this test was prompted by borderline ETT.   Diabetes mellitus without complication dx'd 08/2011   Fasting gluc 136 and HbA1c 6.7% at diagnosis.    Diverticulosis    GERD (gastroesophageal reflux disease)    occ   Hepatic steatosis 07/2020   noted on CT abd/pelv done for GE illness   History of colitis 08/06/2020   presumed infectious. CT abd/pelv w/contrast at [facility] med [facility]->long segment circumferential wall thickening of descending colon   History of nonmelanoma skin cancer    BCC   Hx of adenomatous colonic polyps    Hyperlipidemia    Hypertension    w/hypertensive retinopathy   Internal hemorrhoids    Obesity, Class II, BMI 35-39.9    Osteoarthritis of both knees    mod to severe 2022, steroid injections by ortho helped some.  Visco injections on the right --no significant help.   UTI (urinary tract infection) due to urinary indwelling catheter    05/2022 (e coli and enterococcus)    Past Surgical  History:  Procedure Laterality Date   CARDIOVASCULAR STRESS TEST  02/2013   2015 LexiScan/low-level exercise-Myoview (02/2013): No ischemia, EF 53%, occ symptomatic PVCs.  03/2022 NORMAL/LOW RISK   COLONOSCOPY  02/2021   2023 normal.  Recall 5 yrs   COLONOSCOPY W/ POLYPECTOMY  2005;08/21/10;11/29/15   adenomatous; also mild diverticulosis; recall 2017.  iFOB neg 09/2011.  11/2015: tubular adenoma x 1. 02/2021 normal->recall 29yrs   HAMMER TOE SURGERY Right 05/24/2020   MOHS SURGERY  2018   head, and 2021 on Lt cheek   remvoal sebaceous cyst  15-20 years ago   TONSILLECTOMY     as a child   TOTAL KNEE ARTHROPLASTY Right 05/29/2022   Procedure: TOTAL KNEE ARTHROPLASTY;  Surgeon: Sheral Apley, MD;  Location: [facility];  Service: Orthopedics;  Laterality: Right;   TRANSTHORACIC ECHOCARDIOGRAM     12/2021 EF 55-60%, grd I DD, mild LAE    Outpatient Medications Prior to Visit  Medication Sig Dispense Refill   acetaminophen (TYLENOL) 500 MG tablet Take 2 tablets (1,000 mg total) by mouth every 6 (six) hours as needed for mild pain or moderate pain. 60 tablet 0   apixaban (ELIQUIS) 5 MG TABS tablet Take 1 tablet (5 mg total) by mouth 2 (two) times daily. 60 tablet 6   atorvastatin (LIPITOR) 40 MG tablet Take 1 tablet (40 mg total) by mouth daily. 90 tablet 3   diclofenac Sodium (VOLTAREN) 1 % GEL  Apply 4 g topically 4 (four) times daily. (Patient taking differently: Apply 4 g topically daily as needed (pain).) 100 g 3   glucose blood (FREESTYLE LITE) test strip USE TO CHECK BLOOD SUGAR 1-2 TIMES DAILY AS INSTRUCTED. 50 each 3   hydrochlorothiazide (HYDRODIURIL) 25 MG tablet Take 1 tablet (25 mg total) by mouth daily. 90 tablet 3   metFORMIN (GLUCOPHAGE XR) 500 MG 24 hr tablet 2 tabs po daily with supper 180 tablet 3   methocarbamol (ROBAXIN-750) 750 MG tablet Take 1 tablet (750 mg total) by mouth every 8 (eight) hours as needed for muscle spasms. 20 tablet 0   metoprolol tartrate (LOPRESSOR) 25 MG  tablet Take 1 tablet (25 mg total) by mouth as needed (racing heart beat). Take as needed for racing heart beat 60 tablet 1   ondansetron (ZOFRAN-ODT) 4 MG disintegrating tablet Take 1 tablet (4 mg total) by mouth every 8 (eight) hours as needed for nausea or vomiting. 15 tablet 0   oxyCODONE (ROXICODONE) 5 MG immediate release tablet Take 1 tablet (5 mg total) by mouth every 4 (four) hours as needed for severe pain. 35 tablet 0   potassium chloride SA (KLOR-CON M20) 20 MEQ tablet Take 2 tablets (40 mEq total) by mouth 2 (two) times daily. 360 tablet 3   sennosides-docusate sodium (SENOKOT-S) 8.6-50 MG tablet Take 2 tablets by mouth daily as needed for constipation (while taking narcotics). 30 tablet 1   sildenafil (VIAGRA) 100 MG tablet 1/2-1 tab po qd as needed.  Take 30-60 min prior to intercourse 15 tablet 6   traMADol (ULTRAM) 50 MG tablet Take 50-100 mg by mouth every 6 (six) hours as needed.     mupirocin ointment (BACTROBAN) 2 % Apply topically.     No facility-administered medications prior to visit.    No Known Allergies  Review of Systems As per HPI  PE:    06/20/2022    9:12 AM 06/12/2022   11:42 AM 05/29/2022   12:10 PM  Vitals with BMI  Height     Weight 186 lbs 10 oz 189 lbs   BMI 30.13 30.52   Systolic 131 135 696  Diastolic 79 76 73  Pulse 69 74 50     Physical Exam  Gen: Alert, well appearing.  Patient is oriented to person, place, time, and situation. AFFECT: pleasant, lucid thought and speech. No further exam today  LABS:  Last CBC Lab Results  Component Value Date   WBC 7.1 05/17/2022   HGB 13.0 05/17/2022   HCT 40.5 05/17/2022   MCV 91.6 05/17/2022   MCH 29.4 05/17/2022   RDW 14.6 05/17/2022   PLT 184 05/17/2022   Last metabolic panel Lab Results  Component Value Date   GLUCOSE 123 (H) 05/17/2022   NA 137 05/17/2022   K 3.6 05/17/2022   CL 101 05/17/2022   CO2 27 05/17/2022   BUN 21 05/17/2022   CREATININE 0.94 05/17/2022   GFRNONAA  >60 05/17/2022   CALCIUM 9.5 05/17/2022   PROT 6.6 12/22/2021   ALBUMIN 4.4 12/22/2021   BILITOT 1.0 12/22/2021   ALKPHOS 61 12/22/2021   AST 22 12/22/2021   ALT 22 12/22/2021   ANIONGAP 9 05/17/2022   Last hemoglobin A1c Lab Results  Component Value Date   HGBA1C 7.5 (H) 05/17/2022   IMPRESSION AND PLAN:  UTI, complicated (foley catheter-associated). All symptoms gone status post Bactrim treatment. Signs/symptoms to call or return for were reviewed and pt expressed understanding.  An After Visit Summary was printed and given to the patient.  FOLLOW UP: Return for Keep follow-up appointment already set for 11/25/2021. Has appt 11/25/21  Signed:  Santiago Bumpers, MD           06/20/2022

## 2022-06-21 ENCOUNTER — Encounter: Payer: Self-pay | Admitting: Family Medicine

## 2022-06-21 MED ORDER — SULFAMETHOXAZOLE-TRIMETHOPRIM 800-160 MG PO TABS: 1.0000 | ORAL_TABLET | Freq: Two times a day (BID) | ORAL | 0 refills | Status: AC

## 2022-06-21 NOTE — Telephone Encounter (Signed)
Okay, prescription sent 

## 2022-06-27 DIAGNOSIS — M1711 Unilateral primary osteoarthritis, right knee: Secondary | ICD-10-CM | POA: Diagnosis not present

## 2022-06-27 DIAGNOSIS — R262 Difficulty in walking, not elsewhere classified: Secondary | ICD-10-CM | POA: Diagnosis not present

## 2022-06-27 DIAGNOSIS — M25661 Stiffness of right knee, not elsewhere classified: Secondary | ICD-10-CM | POA: Diagnosis not present

## 2022-06-27 DIAGNOSIS — M6281 Muscle weakness (generalized): Secondary | ICD-10-CM | POA: Diagnosis not present

## 2022-06-29 DIAGNOSIS — M25661 Stiffness of right knee, not elsewhere classified: Secondary | ICD-10-CM | POA: Diagnosis not present

## 2022-06-29 DIAGNOSIS — R262 Difficulty in walking, not elsewhere classified: Secondary | ICD-10-CM | POA: Diagnosis not present

## 2022-06-29 DIAGNOSIS — M1711 Unilateral primary osteoarthritis, right knee: Secondary | ICD-10-CM | POA: Diagnosis not present

## 2022-06-29 DIAGNOSIS — M6281 Muscle weakness (generalized): Secondary | ICD-10-CM | POA: Diagnosis not present

## 2022-07-02 DIAGNOSIS — M25661 Stiffness of right knee, not elsewhere classified: Secondary | ICD-10-CM | POA: Diagnosis not present

## 2022-07-02 DIAGNOSIS — R262 Difficulty in walking, not elsewhere classified: Secondary | ICD-10-CM | POA: Diagnosis not present

## 2022-07-02 DIAGNOSIS — M1711 Unilateral primary osteoarthritis, right knee: Secondary | ICD-10-CM | POA: Diagnosis not present

## 2022-07-02 DIAGNOSIS — M6281 Muscle weakness (generalized): Secondary | ICD-10-CM | POA: Diagnosis not present

## 2022-07-04 DIAGNOSIS — R262 Difficulty in walking, not elsewhere classified: Secondary | ICD-10-CM | POA: Diagnosis not present

## 2022-07-04 DIAGNOSIS — M1711 Unilateral primary osteoarthritis, right knee: Secondary | ICD-10-CM | POA: Diagnosis not present

## 2022-07-04 DIAGNOSIS — M6281 Muscle weakness (generalized): Secondary | ICD-10-CM | POA: Diagnosis not present

## 2022-07-04 DIAGNOSIS — M25661 Stiffness of right knee, not elsewhere classified: Secondary | ICD-10-CM | POA: Diagnosis not present

## 2022-07-11 ENCOUNTER — Encounter: Payer: Self-pay | Admitting: Cardiology

## 2022-07-11 DIAGNOSIS — I480 Paroxysmal atrial fibrillation: Secondary | ICD-10-CM

## 2022-07-11 DIAGNOSIS — M25661 Stiffness of right knee, not elsewhere classified: Secondary | ICD-10-CM | POA: Diagnosis not present

## 2022-07-11 DIAGNOSIS — R262 Difficulty in walking, not elsewhere classified: Secondary | ICD-10-CM | POA: Diagnosis not present

## 2022-07-11 DIAGNOSIS — M1711 Unilateral primary osteoarthritis, right knee: Secondary | ICD-10-CM | POA: Diagnosis not present

## 2022-07-11 DIAGNOSIS — M6281 Muscle weakness (generalized): Secondary | ICD-10-CM | POA: Diagnosis not present

## 2022-07-12 NOTE — Telephone Encounter (Signed)
Please see the MyChart message reply(ies) for my assessment and plan.   Unfortunately, stopping Eliquis would put you at an increased risk for stroke and potential severe debilitation.  I do not recommend stopping the Eliquis.  I know this is frustrating given the fact that you are in significant discomfort with your knees.  I do have a possible solution for this however.  We have a device called a Watchman device which can be implanted in the left atrial appendage, a structure within the heart that can contain blood clots.  6 weeks after this device is placed, you may come off of the Eliquis safely.  If interested, I can have you talk with our structural heart team for further information.   This patient gave consent for this Medical Advice Message and is aware that it may result in a bill to [employer], as well as the possibility of receiving a bill for a co-payment or deductible. They are an established patient, but are not seeking medical advice exclusively about a problem treated during an in person or video visit in the last seven days. I did not recommend an in person or video visit within seven days of my reply.    I spent a total of 5 minutes cumulative time within 7 days through [employer].  Donato Schultz, MD

## 2022-07-16 DIAGNOSIS — R262 Difficulty in walking, not elsewhere classified: Secondary | ICD-10-CM | POA: Diagnosis not present

## 2022-07-16 DIAGNOSIS — M25661 Stiffness of right knee, not elsewhere classified: Secondary | ICD-10-CM | POA: Diagnosis not present

## 2022-07-16 DIAGNOSIS — M1711 Unilateral primary osteoarthritis, right knee: Secondary | ICD-10-CM | POA: Diagnosis not present

## 2022-07-16 DIAGNOSIS — M6281 Muscle weakness (generalized): Secondary | ICD-10-CM | POA: Diagnosis not present

## 2022-07-26 NOTE — Addendum Note (Signed)
Addended by: Emi Holes D on: 07/26/2022 03:30 PM   Modules accepted: Orders

## 2022-07-30 DIAGNOSIS — M1711 Unilateral primary osteoarthritis, right knee: Secondary | ICD-10-CM | POA: Diagnosis not present

## 2022-07-30 DIAGNOSIS — M25661 Stiffness of right knee, not elsewhere classified: Secondary | ICD-10-CM | POA: Diagnosis not present

## 2022-07-30 DIAGNOSIS — R262 Difficulty in walking, not elsewhere classified: Secondary | ICD-10-CM | POA: Diagnosis not present

## 2022-07-30 DIAGNOSIS — M6281 Muscle weakness (generalized): Secondary | ICD-10-CM | POA: Diagnosis not present

## 2022-08-06 ENCOUNTER — Other Ambulatory Visit: Payer: Medicare PPO

## 2022-08-09 ENCOUNTER — Telehealth: Payer: Medicare PPO | Admitting: Family Medicine

## 2022-08-09 ENCOUNTER — Encounter: Payer: Self-pay | Admitting: Family Medicine

## 2022-08-09 ENCOUNTER — Other Ambulatory Visit: Payer: Self-pay

## 2022-08-09 DIAGNOSIS — E119 Type 2 diabetes mellitus without complications: Secondary | ICD-10-CM

## 2022-08-09 DIAGNOSIS — N3001 Acute cystitis with hematuria: Secondary | ICD-10-CM

## 2022-08-09 DIAGNOSIS — R310 Gross hematuria: Secondary | ICD-10-CM

## 2022-08-09 DIAGNOSIS — R361 Hematospermia: Secondary | ICD-10-CM | POA: Diagnosis not present

## 2022-08-09 MED ORDER — FREESTYLE LITE TEST VI STRP
ORAL_STRIP | 6 refills | Status: DC
Start: 2022-08-09 — End: 2023-06-04

## 2022-08-09 MED ORDER — SULFAMETHOXAZOLE-TRIMETHOPRIM 800-160 MG PO TABS: ORAL_TABLET | ORAL | 0 refills | Status: DC

## 2022-08-09 NOTE — Progress Notes (Signed)
Virtual Visit via Video Note  I connected with Michael Hodge  on 08/09/22 at  8:00 AM EDT by a video enabled telemedicine application and verified that I am speaking with the correct person using two identifiers.  Location patient: [place] Location provider:work or home office Persons participating in the virtual visit: patient, provider  I discussed the limitations and requested verbal permission for telemedicine visit. The patient expressed understanding and agreed to proceed.   HPI: 72 year old male being seen today for urinary concerns.  2 months ago had some gross hematuria after having had Foley cath for surgery not long before (also on eliquis).  UA showed 2+ blood otherwise normal.  Bactrim prescribed.  Symptoms completely abated and urine culture showed E. coli resistant to penicillins and first generation cephalosporins and Enterococcus faecalis sensitive to ampicillin and nitrofurantoin. Not long after the follow-up visit he began developing some dysuria and gross hematuria again so I prescribed him another course of Bactrim ( 06/21/22).  Currently: He has had several ejaculates that have had some blood in them lately.  No pain with ejaculation.  No dysuria, urinary urgency, or urinary frequency.  No suprapubic or testicular pain. No blood in urine.  ROS: See pertinent positives and negatives per HPI.  Past Medical History:  Diagnosis Date   Atrial fibrillation (HCC)    11/2021   CAD (coronary artery disease)    CT demonstrated nonobstructive plaques (this test was prompted by borderline ETT.   Diabetes mellitus without complication (HCC) dx'd 08/2011   Fasting gluc 136 and HbA1c 6.7% at diagnosis.    Diverticulosis    GERD (gastroesophageal reflux disease)    occ   Hepatic steatosis 07/2020   noted on CT abd/pelv done for GE illness   History of colitis 08/06/2020   presumed infectious. CT abd/pelv w/contrast at [facility]->long segment circumferential wall thickening of  descending colon   History of nonmelanoma skin cancer    BCC   Hx of adenomatous colonic polyps    Hyperlipidemia    Hypertension    w/hypertensive retinopathy   Internal hemorrhoids    Obesity, Class II, BMI 35-39.9    Osteoarthritis of both knees    mod to severe 2022, steroid injections by ortho helped some.  Visco injections on the right --no significant help.   UTI (urinary tract infection) due to urinary indwelling catheter (HCC)    05/2022 (e coli and enterococcus)    Past Surgical History:  Procedure Laterality Date   CARDIOVASCULAR STRESS TEST  02/2013   2015 LexiScan/low-level exercise-Myoview (02/2013): No ischemia, EF 53%, occ symptomatic PVCs.  03/2022 NORMAL/LOW RISK   COLONOSCOPY  02/2021   2023 normal.  Recall 5 yrs   COLONOSCOPY W/ POLYPECTOMY  2005;08/21/10;11/29/15   adenomatous; also mild diverticulosis; recall 2017.  iFOB neg 09/2011.  11/2015: tubular adenoma x 1. 02/2021 normal->recall 33yrs   HAMMER TOE SURGERY Right 05/24/2020   MOHS SURGERY  2018   head, and 2021 on Lt cheek   remvoal sebaceous cyst  15-20 years ago   TONSILLECTOMY     as a child   TOTAL KNEE ARTHROPLASTY Right 05/29/2022   Procedure: TOTAL KNEE ARTHROPLASTY;  Surgeon: Sheral Apley, MD;  Location: [facility];  Service: Orthopedics;  Laterality: Right;   TRANSTHORACIC ECHOCARDIOGRAM     12/2021 EF 55-60%, grd I DD, mild LAE     Current Outpatient Medications:    acetaminophen (TYLENOL) 500 MG tablet, Take 2 tablets (1,000 mg total) by mouth every 6 (  six) hours as needed for mild pain or moderate pain., Disp: 60 tablet, Rfl: 0   apixaban (ELIQUIS) 5 MG TABS tablet, Take 1 tablet (5 mg total) by mouth 2 (two) times daily., Disp: 60 tablet, Rfl: 6   atorvastatin (LIPITOR) 40 MG tablet, Take 1 tablet (40 mg total) by mouth daily., Disp: 90 tablet, Rfl: 3   diclofenac Sodium (VOLTAREN) 1 % GEL, Apply 4 g topically 4 (four) times daily. (Patient taking differently: Apply 4 g topically daily as needed  (pain).), Disp: 100 g, Rfl: 3   glucose blood (FREESTYLE LITE) test strip, USE TO CHECK BLOOD SUGAR 1-2 TIMES DAILY AS INSTRUCTED., Disp: 50 each, Rfl: 3   hydrochlorothiazide (HYDRODIURIL) 25 MG tablet, Take 1 tablet (25 mg total) by mouth daily., Disp: 90 tablet, Rfl: 3   metFORMIN (GLUCOPHAGE XR) 500 MG 24 hr tablet, 2 tabs po daily with supper, Disp: 180 tablet, Rfl: 3   methocarbamol (ROBAXIN-750) 750 MG tablet, Take 1 tablet (750 mg total) by mouth every 8 (eight) hours as needed for muscle spasms., Disp: 20 tablet, Rfl: 0   metoprolol tartrate (LOPRESSOR) 25 MG tablet, Take 1 tablet (25 mg total) by mouth as needed (racing heart beat). Take as needed for racing heart beat, Disp: 60 tablet, Rfl: 1   ondansetron (ZOFRAN-ODT) 4 MG disintegrating tablet, Take 1 tablet (4 mg total) by mouth every 8 (eight) hours as needed for nausea or vomiting., Disp: 15 tablet, Rfl: 0   oxyCODONE (ROXICODONE) 5 MG immediate release tablet, Take 1 tablet (5 mg total) by mouth every 4 (four) hours as needed for severe pain., Disp: 35 tablet, Rfl: 0   potassium chloride SA (KLOR-CON M20) 20 MEQ tablet, Take 2 tablets (40 mEq total) by mouth 2 (two) times daily., Disp: 360 tablet, Rfl: 3   sennosides-docusate sodium (SENOKOT-S) 8.6-50 MG tablet, Take 2 tablets by mouth daily as needed for constipation (while taking narcotics)., Disp: 30 tablet, Rfl: 1   sildenafil (VIAGRA) 100 MG tablet, 1/2-1 tab po qd as needed.  Take 30-60 min prior to intercourse, Disp: 15 tablet, Rfl: 6   traMADol (ULTRAM) 50 MG tablet, Take 50-100 mg by mouth every 6 (six) hours as needed., Disp: , Rfl:   EXAM:  VITALS per patient if applicable:     06/20/2022    9:12 AM 06/12/2022   11:42 AM 05/29/2022   12:10 PM  Vitals with BMI  Height  5\' 6"    Weight 186 lbs 10 oz 189 lbs   BMI 30.13 30.52   Systolic 131 135 161  Diastolic 79 76 73  Pulse 69 74 50     GENERAL: alert, oriented, appears well and in no acute distress  HEENT:  atraumatic, conjunttiva clear, no obvious abnormalities on inspection of external nose and ears  NECK: normal movements of the head and neck  LUNGS: on inspection no signs of respiratory distress, breathing rate appears normal, no obvious gross SOB, gasping or wheezing  CV: no obvious cyanosis  MS: moves all visible extremities without noticeable abnormality  PSYCH/NEURO: pleasant and cooperative, no obvious depression or anxiety, speech and thought processing grossly intact  LABS: none today Lab Results  Component Value Date   COLORU dark yellow 06/12/2022   CLARITYU clear 06/12/2022   GLUCOSEUR Negative 06/12/2022   BILIRUBINUR Negative 06/12/2022   KETONESU Negative 06/12/2022   SPECGRAV >=1.030 (A) 06/12/2022   RBCUR 2+ 06/12/2022   PHUR 6.0 06/12/2022   PROTEINUR Negative 06/12/2022   UROBILINOGEN 1.0  06/12/2022   LEUKOCYTESUR Negative 06/12/2022    ASSESSMENT AND PLAN:  Discussed the following assessment and plan:  Hematospermia. (Patient with relatively recent urinary tract infection signaled by gross hematuria.  He had been on Eliquis and had recently had a Foley catheter just prior to the infection). Will start Bactrim double strength 1 twice daily x 14 days.  Will ask urology to see him.   I discussed the assessment and treatment plan with the patient. The patient was provided an opportunity to ask questions and all were answered. The patient agreed with the plan and demonstrated an understanding of the instructions.   F/u: prn  Signed:  Santiago Bumpers, MD           08/09/2022

## 2022-08-13 ENCOUNTER — Other Ambulatory Visit: Payer: Self-pay

## 2022-08-13 DIAGNOSIS — I480 Paroxysmal atrial fibrillation: Secondary | ICD-10-CM

## 2022-08-13 MED ORDER — APIXABAN 5 MG PO TABS
5.0000 mg | ORAL_TABLET | Freq: Two times a day (BID) | ORAL | 5 refills | Status: DC
Start: 2022-08-13 — End: 2023-02-18

## 2022-08-13 NOTE — Telephone Encounter (Signed)
Prescription refill request for Eliquis received. Indication: Afib  Last office visit:05/09/22 ([place])  Scr: 0.94 (05/17/22)  Age: 72 Weight: 84.6kg  Appropriate dose. Refill sent.

## 2022-08-16 DIAGNOSIS — M25661 Stiffness of right knee, not elsewhere classified: Secondary | ICD-10-CM | POA: Diagnosis not present

## 2022-08-16 DIAGNOSIS — M6281 Muscle weakness (generalized): Secondary | ICD-10-CM | POA: Diagnosis not present

## 2022-08-16 DIAGNOSIS — R262 Difficulty in walking, not elsewhere classified: Secondary | ICD-10-CM | POA: Diagnosis not present

## 2022-08-16 DIAGNOSIS — M1711 Unilateral primary osteoarthritis, right knee: Secondary | ICD-10-CM | POA: Diagnosis not present

## 2022-08-17 ENCOUNTER — Other Ambulatory Visit: Payer: Medicare PPO

## 2022-08-18 ENCOUNTER — Encounter: Payer: Self-pay | Admitting: Family Medicine

## 2022-08-18 DIAGNOSIS — N3001 Acute cystitis with hematuria: Secondary | ICD-10-CM

## 2022-08-18 DIAGNOSIS — E119 Type 2 diabetes mellitus without complications: Secondary | ICD-10-CM

## 2022-08-20 NOTE — Telephone Encounter (Signed)
Urinalysis, urine culture, and urine microalbumin/creatinine all ordered--Future. Thanks.

## 2022-08-21 DIAGNOSIS — M6281 Muscle weakness (generalized): Secondary | ICD-10-CM | POA: Diagnosis not present

## 2022-08-21 DIAGNOSIS — R262 Difficulty in walking, not elsewhere classified: Secondary | ICD-10-CM | POA: Diagnosis not present

## 2022-08-21 DIAGNOSIS — M1711 Unilateral primary osteoarthritis, right knee: Secondary | ICD-10-CM | POA: Diagnosis not present

## 2022-08-21 DIAGNOSIS — M25661 Stiffness of right knee, not elsewhere classified: Secondary | ICD-10-CM | POA: Diagnosis not present

## 2022-08-22 ENCOUNTER — Other Ambulatory Visit (INDEPENDENT_AMBULATORY_CARE_PROVIDER_SITE_OTHER): Payer: Medicare PPO

## 2022-08-22 DIAGNOSIS — N3001 Acute cystitis with hematuria: Secondary | ICD-10-CM | POA: Diagnosis not present

## 2022-08-22 DIAGNOSIS — E119 Type 2 diabetes mellitus without complications: Secondary | ICD-10-CM | POA: Diagnosis not present

## 2022-08-22 LAB — MICROALBUMIN / CREATININE URINE RATIO
Creatinine,U: 110.1 mg/dL
Microalb Creat Ratio: 0.6 mg/g (ref 0.0–30.0)
Microalb, Ur: <0.7 mg/dL (ref 0.0–1.9)

## 2022-08-22 LAB — URINALYSIS, ROUTINE W REFLEX MICROSCOPIC
Bilirubin Urine: NEGATIVE
Hgb urine dipstick: NEGATIVE
Ketones, ur: NEGATIVE
Leukocytes,Ua: NEGATIVE
Nitrite: NEGATIVE
RBC / HPF: NONE SEEN (ref 0–?)
Specific Gravity, Urine: >=1.03 — AB (ref 1.000–1.030)
Total Protein, Urine: NEGATIVE
Urine Glucose: NEGATIVE
Urobilinogen, UA: 0.2 (ref 0.0–1.0)
pH: 6 (ref 5.0–8.0)

## 2022-08-22 LAB — HEMOGLOBIN A1C: Hgb A1c MFr Bld: 6.8 % — ABNORMAL HIGH (ref 4.6–6.5)

## 2022-08-23 LAB — URINE CULTURE
MICRO NUMBER:: 15130681
Result:: NO GROWTH
SPECIMEN QUALITY:: ADEQUATE

## 2022-08-23 LAB — EXTRA URINE SPECIMEN

## 2022-09-10 DIAGNOSIS — L723 Sebaceous cyst: Secondary | ICD-10-CM | POA: Diagnosis not present

## 2022-09-10 DIAGNOSIS — D044 Carcinoma in situ of skin of scalp and neck: Secondary | ICD-10-CM | POA: Diagnosis not present

## 2022-09-10 DIAGNOSIS — L821 Other seborrheic keratosis: Secondary | ICD-10-CM | POA: Diagnosis not present

## 2022-09-10 DIAGNOSIS — D225 Melanocytic nevi of trunk: Secondary | ICD-10-CM | POA: Diagnosis not present

## 2022-09-10 DIAGNOSIS — D485 Neoplasm of uncertain behavior of skin: Secondary | ICD-10-CM | POA: Diagnosis not present

## 2022-09-10 DIAGNOSIS — Z85828 Personal history of other malignant neoplasm of skin: Secondary | ICD-10-CM | POA: Diagnosis not present

## 2022-09-10 DIAGNOSIS — L570 Actinic keratosis: Secondary | ICD-10-CM | POA: Diagnosis not present

## 2022-09-27 ENCOUNTER — Other Ambulatory Visit: Payer: Self-pay | Admitting: Family Medicine

## 2022-10-11 ENCOUNTER — Encounter (INDEPENDENT_AMBULATORY_CARE_PROVIDER_SITE_OTHER): Payer: Self-pay

## 2022-10-23 ENCOUNTER — Telehealth: Payer: Medicare PPO | Admitting: Family Medicine

## 2022-10-23 DIAGNOSIS — U071 COVID-19: Secondary | ICD-10-CM

## 2022-10-23 MED ORDER — FLUTICASONE PROPIONATE 50 MCG/ACT NA SUSP
2.0000 | Freq: Every day | NASAL | 0 refills | Status: DC
Start: 2022-10-23 — End: 2022-12-10

## 2022-10-23 MED ORDER — MOLNUPIRAVIR EUA 200MG CAPSULE
4.0000 | ORAL_CAPSULE | Freq: Two times a day (BID) | ORAL | 0 refills | Status: AC
Start: 2022-10-23 — End: 2022-10-28

## 2022-10-23 MED ORDER — BENZONATATE 200 MG PO CAPS
200.0000 mg | ORAL_CAPSULE | Freq: Two times a day (BID) | ORAL | 0 refills | Status: DC | PRN
Start: 2022-10-23 — End: 2022-12-10

## 2022-10-23 MED ORDER — ALBUTEROL SULFATE HFA 108 (90 BASE) MCG/ACT IN AERS
1.0000 | INHALATION_SPRAY | Freq: Four times a day (QID) | RESPIRATORY_TRACT | 0 refills | Status: DC | PRN
Start: 2022-10-23 — End: 2022-12-10

## 2022-10-23 NOTE — Progress Notes (Signed)
Virtual Visit Consent   Michael Hodge., you are scheduled for a virtual visit with a [facility] provider today. Just as with appointments in the office, your consent must be obtained to participate. Your consent will be active for this visit and any virtual visit you may have with one of our providers in the next 365 days. If you have a MyChart account, a copy of this consent can be sent to you electronically.  As this is a virtual visit, video technology does not allow for your provider to perform a traditional examination. This may limit your provider's ability to fully assess your condition. If your provider identifies any concerns that need to be evaluated in person or the need to arrange testing (such as labs, EKG, etc.), we will make arrangements to do so. Although advances in technology are sophisticated, we cannot ensure that it will always work on either your end or our end. If the connection with a video visit is poor, the visit may have to be switched to a telephone visit. With either a video or telephone visit, we are not always able to ensure that we have a secure connection.  By engaging in this virtual visit, you consent to the provision of healthcare and authorize for your insurance to be billed (if applicable) for the services provided during this visit. Depending on your insurance coverage, you may receive a charge related to this service.  I need to obtain your verbal consent now. Are you willing to proceed with your visit today? Michael Hodge. has provided verbal consent on 10/23/2022 for a virtual visit (video or telephone). Freddy Finner, NP  Date: 10/23/2022 10:21 AM  Virtual Visit via Video Note   I, Freddy Finner, connected with  Michael Hodge.  ([id number], 05-Feb-1951) on 10/23/22 at 10:30 AM EDT by a video-enabled telemedicine application and verified that I am speaking with the correct person using two identifiers.  Location: Patient: Virtual Visit Location  Patient: Home Provider: Virtual Visit Location Provider: Home Office   I discussed the limitations of evaluation and management by telemedicine and the availability of in person appointments. The patient expressed understanding and agreed to proceed.    History of Present Illness: [name] is a 72 y.o. who identifies as a male who was assigned male at birth, and is being seen today for COVID   Onset was scratchy throat Saturday into Sunday-  Associated symptoms are 99.9 last night- delayed released tylenol, head congestion in upper sinuses- pressure, cough, runny nose, fatigue Modifying factors are tylenol as stated above, and coricidin  Denies chest pain, shortness of breath, fevers, chills   Exposure to sick contacts- known- Friday was positive Sunday (he was around them Saturday) COVID test: 2 negative and then + this morning  Vaccines: has not had the new booster- but did get the 5 prior     Problems:  Patient Active Problem List   Diagnosis Date Noted   Welcome to Medicare preventive visit 04/14/2015   Diabetes mellitus without complication (HCC) 03/20/2014   Sciatica 07/04/2013   Hypokalemia 07/04/2013   Type II or unspecified type diabetes mellitus without mention of complication, not stated as uncontrolled 10/11/2011   Health maintenance examination 09/17/2011   CAD (coronary artery disease) 09/08/2010   HTN (hypertension) 09/08/2010   Dyslipidemia 09/08/2010   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   ARTHRITIS 01/20/2008   ALLERGIC RHINITIS 10/30/2006    Allergies: No Known Allergies Medications:  Current Outpatient Medications:    acetaminophen (TYLENOL) 500 MG tablet, Take 2 tablets (1,000 mg total) by mouth every 6 (six) hours as needed for mild pain or moderate pain., Disp: 60 tablet, Rfl: 0   apixaban (ELIQUIS) 5 MG TABS tablet, Take 1 tablet (5 mg total) by mouth 2 (two) times daily., Disp: 60 tablet, Rfl: 5   atorvastatin (LIPITOR) 40 MG tablet, Take 1 tablet  (40 mg total) by mouth daily., Disp: 90 tablet, Rfl: 3   diclofenac Sodium (VOLTAREN) 1 % GEL, Apply 4 g topically 4 (four) times daily. (Patient taking differently: Apply 4 g topically daily as needed (pain).), Disp: 100 g, Rfl: 3   glucose blood (FREESTYLE LITE) test strip, USE TO CHECK BLOOD SUGAR 1-2 TIMES DAILY AS INSTRUCTED., Disp: 100 each, Rfl: 6   hydrochlorothiazide (HYDRODIURIL) 25 MG tablet, Take 1 tablet (25 mg total) by mouth daily., Disp: 90 tablet, Rfl: 3   metFORMIN (GLUCOPHAGE XR) 500 MG 24 hr tablet, 2 tabs po daily with supper, Disp: 180 tablet, Rfl: 3   metoprolol tartrate (LOPRESSOR) 25 MG tablet, Take 1 tablet (25 mg total) by mouth as needed (racing heart beat). Take as needed for racing heart beat, Disp: 60 tablet, Rfl: 1   potassium chloride SA (KLOR-CON M20) 20 MEQ tablet, Take 2 tablets (40 mEq total) by mouth 2 (two) times daily., Disp: 360 tablet, Rfl: 3   sildenafil (VIAGRA) 100 MG tablet, 1/2-1 tab po qd as needed.  Take 30-60 min prior to intercourse, Disp: 15 tablet, Rfl: 6   sulfamethoxazole-trimethoprim (BACTRIM DS) 800-160 MG tablet, 1 bid x 14d, Disp: 28 tablet, Rfl: 0   traMADol (ULTRAM) 50 MG tablet, Take 50-100 mg by mouth every 6 (six) hours as needed., Disp: , Rfl:   Observations/Objective: Patient is well-developed, well-nourished in no acute distress.  Resting comfortably  at home.  Head is normocephalic, atraumatic.  No labored breathing.  Speech is clear and coherent with logical content.  Patient is alert and oriented at baseline.  Cough and Congestion present during visit  Assessment and Plan:  1. COVID-19  - albuterol (VENTOLIN HFA) 108 (90 Base) MCG/ACT inhaler; Inhale 1-2 puffs into the lungs every 6 (six) hours as needed for wheezing or shortness of breath.  Dispense: 8 g; Refill: 0 - molnupiravir EUA (LAGEVRIO) 200 mg CAPS capsule; Take 4 capsules (800 mg total) by mouth 2 (two) times daily for 5 days.  Dispense: 40 capsule; Refill: 0 -  fluticasone (FLONASE) 50 MCG/ACT nasal spray; Place 2 sprays into both nostrils daily.  Dispense: 16 g; Refill: 0 - benzonatate (TESSALON) 200 MG capsule; Take 1 capsule (200 mg total) by mouth 2 (two) times daily as needed for cough.  Dispense: 20 capsule; Refill: 0  - Continue OTC symptomatic management of choice - Info for COVID sent on AVS as well - Take prescribed medications as directed - Push fluids - Rest as needed - Discussed return precautions and when to seek in-person evaluation, sent via AVS as well  Reviewed side effects, risks and benefits of medication.    Patient acknowledged agreement and understanding of the plan.   Past Medical, Surgical, Social History, Allergies, and Medications have been Reviewed.     Follow Up Instructions: I discussed the assessment and treatment plan with the patient. The patient was provided an opportunity to ask questions and all were answered. The patient agreed with the plan and demonstrated an understanding of the instructions.  A copy of instructions were sent  to the patient via MyChart unless otherwise noted below.    The patient was advised to call back or seek an in-person evaluation if the symptoms worsen or if the condition fails to improve as anticipated.  Time:  I spent 10 minutes with the patient via telehealth technology discussing the above problems/concerns.    Freddy Finner, NP

## 2022-10-23 NOTE — Patient Instructions (Addendum)
Michael Hodge., thank you for joining Freddy Finner, NP for today's virtual visit.  While this provider is not your primary care provider (PCP), if your PCP is located in our provider database this encounter information will be shared with them immediately following your visit.   A [facility] MyChart account gives you access to today's visit and all your visits, tests, and labs performed at [facility] " click here if you don't have a [facility] MyChart account or go to [email or web address]  Consent: (Patient) Michael Hodge. provided verbal consent for this virtual visit at the beginning of the encounter.  Current Medications:  Current Outpatient Medications:    albuterol (VENTOLIN HFA) 108 (90 Base) MCG/ACT inhaler, Inhale 1-2 puffs into the lungs every 6 (six) hours as needed for wheezing or shortness of breath., Disp: 8 g, Rfl: 0   benzonatate (TESSALON) 200 MG capsule, Take 1 capsule (200 mg total) by mouth 2 (two) times daily as needed for cough., Disp: 20 capsule, Rfl: 0   fluticasone (FLONASE) 50 MCG/ACT nasal spray, Place 2 sprays into both nostrils daily., Disp: 16 g, Rfl: 0   molnupiravir EUA (LAGEVRIO) 200 mg CAPS capsule, Take 4 capsules (800 mg total) by mouth 2 (two) times daily for 5 days., Disp: 40 capsule, Rfl: 0   acetaminophen (TYLENOL) 500 MG tablet, Take 2 tablets (1,000 mg total) by mouth every 6 (six) hours as needed for mild pain or moderate pain., Disp: 60 tablet, Rfl: 0   apixaban (ELIQUIS) 5 MG TABS tablet, Take 1 tablet (5 mg total) by mouth 2 (two) times daily., Disp: 60 tablet, Rfl: 5   atorvastatin (LIPITOR) 40 MG tablet, Take 1 tablet (40 mg total) by mouth daily., Disp: 90 tablet, Rfl: 3   diclofenac Sodium (VOLTAREN) 1 % GEL, Apply 4 g topically 4 (four) times daily. (Patient taking differently: Apply 4 g topically daily as needed (pain).), Disp: 100 g, Rfl: 3   glucose blood (FREESTYLE LITE) test strip, USE TO CHECK BLOOD SUGAR 1-2  TIMES DAILY AS INSTRUCTED., Disp: 100 each, Rfl: 6   hydrochlorothiazide (HYDRODIURIL) 25 MG tablet, Take 1 tablet (25 mg total) by mouth daily., Disp: 90 tablet, Rfl: 3   metFORMIN (GLUCOPHAGE XR) 500 MG 24 hr tablet, 2 tabs po daily with supper, Disp: 180 tablet, Rfl: 3   metoprolol tartrate (LOPRESSOR) 25 MG tablet, Take 1 tablet (25 mg total) by mouth as needed (racing heart beat). Take as needed for racing heart beat, Disp: 60 tablet, Rfl: 1   potassium chloride SA (KLOR-CON M20) 20 MEQ tablet, Take 2 tablets (40 mEq total) by mouth 2 (two) times daily., Disp: 360 tablet, Rfl: 3   sildenafil (VIAGRA) 100 MG tablet, 1/2-1 tab po qd as needed.  Take 30-60 min prior to intercourse, Disp: 15 tablet, Rfl: 6   sulfamethoxazole-trimethoprim (BACTRIM DS) 800-160 MG tablet, 1 bid x 14d, Disp: 28 tablet, Rfl: 0   traMADol (ULTRAM) 50 MG tablet, Take 50-100 mg by mouth every 6 (six) hours as needed., Disp: , Rfl:    Medications ordered in this encounter:  Meds ordered this encounter  Medications   albuterol (VENTOLIN HFA) 108 (90 Base) MCG/ACT inhaler    Sig: Inhale 1-2 puffs into the lungs every 6 (six) hours as needed for wheezing or shortness of breath.    Dispense:  8 g    Refill:  0    Order Specific Question:   Supervising Provider    Answer:  LAMPTEY, PHILIP O [[id number]]   molnupiravir EUA (LAGEVRIO) 200 mg CAPS capsule    Sig: Take 4 capsules (800 mg total) by mouth 2 (two) times daily for 5 days.    Dispense:  40 capsule    Refill:  0    Order Specific Question:   Supervising Provider    Answer:   Merrilee Jansky [[id number]]   fluticasone (FLONASE) 50 MCG/ACT nasal spray    Sig: Place 2 sprays into both nostrils daily.    Dispense:  16 g    Refill:  0    Order Specific Question:   Supervising Provider    Answer:   Merrilee Jansky [[id number]]   benzonatate (TESSALON) 200 MG capsule    Sig: Take 1 capsule (200 mg total) by mouth 2 (two) times daily as needed for cough.    Dispense:   20 capsule    Refill:  0    Order Specific Question:   Supervising Provider    Answer:   Merrilee Jansky [id number]     *If you need refills on other medications prior to your next appointment, please contact your pharmacy*  Follow-Up: Call back or seek an in-person evaluation if the symptoms worsen or if the condition fails to improve as anticipated.  [facility] [phone]  Care Instructions:  - Continue OTC symptomatic management of choice - Take prescribed medications as directed - Push fluids - Rest as needed  Please follow up if you are not improving or have worsening symptoms   Isolation Instructions: You are to isolate at home until you have been fever free for at least 24 hours without a fever-reducing medication, and symptoms have been steadily improving for 24 hours. At that time,  you can end isolation but need to mask for an additional 5 days.   If you must be around other household members who do not have symptoms, you need to make sure that both you and the family members are masking consistently with a high-quality mask.  If you note any worsening of symptoms despite treatment, please seek an in-person evaluation ASAP. If you note any significant shortness of breath or any chest pain, please seek ER evaluation. Please do not delay care!   COVID-19: What to Do if You Are Sick If you test positive and are an older adult or someone who is at high risk of getting very sick from COVID-19, treatment may be available. Contact a healthcare provider right away after a positive test to determine if you are eligible, even if your symptoms are mild right now. You can also visit a Test to Treat location and, if eligible, receive a prescription from a provider. Don't delay: Treatment must be started within the first few days to be effective. If you have a fever, cough, or other symptoms, you might have COVID-19. Most people have mild illness and are able to  recover at home. If you are sick: Keep track of your symptoms. If you have an emergency warning sign (including trouble breathing), call 911. Steps to help prevent the spread of COVID-19 if you are sick If you are sick with COVID-19 or think you might have COVID-19, follow the steps below to care for yourself and to help protect other people in your home and community. Stay home except to get medical care Stay home. Most people with COVID-19 have mild illness and can recover at home without medical care. Do not leave your home, except to get  medical care. Do not visit public areas and do not go to places where you are unable to wear a mask. Take care of yourself. Get rest and stay hydrated. Take over-the-counter medicines, such as acetaminophen, to help you feel better. Stay in touch with your doctor. Call before you get medical care. Be sure to get care if you have trouble breathing, or have any other emergency warning signs, or if you think it is an emergency. Avoid public transportation, ride-sharing, or taxis if possible. Get tested If you have symptoms of COVID-19, get tested. While waiting for test results, stay away from others, including staying apart from those living in your household. Get tested as soon as possible after your symptoms start. Treatments may be available for people with COVID-19 who are at risk for becoming very sick. Don't delay: Treatment must be started early to be effective--some treatments must begin within 5 days of your first symptoms. Contact your healthcare provider right away if your test result is positive to determine if you are eligible. Self-tests are one of several options for testing for the virus that causes COVID-19 and may be more convenient than laboratory-based tests and point-of-care tests. Ask your healthcare provider or your local health department if you need help interpreting your test results. You can visit your state, tribal, local, and territorial  health department's website to look for the latest local information on testing sites. Separate yourself from other people As much as possible, stay in a specific room and away from other people and pets in your home. If possible, you should use a separate bathroom. If you need to be around other people or animals in or outside of the home, wear a well-fitting mask. Tell your close contacts that they may have been exposed to COVID-19. An infected person can spread COVID-19 starting 48 hours (or 2 days) before the person has any symptoms or tests positive. By letting your close contacts know they may have been exposed to COVID-19, you are helping to protect everyone. See COVID-19 and Animals if you have questions about pets. If you are diagnosed with COVID-19, someone from the health department may call you. Answer the call to slow the spread. Monitor your symptoms Symptoms of COVID-19 include fever, cough, or other symptoms. Follow care instructions from your healthcare provider and local health department. Your local health authorities may give instructions on checking your symptoms and reporting information. When to seek emergency medical attention Look for emergency warning signs* for COVID-19. If someone is showing any of these signs, seek emergency medical care immediately: Trouble breathing Persistent pain or pressure in the chest New confusion Inability to wake or stay awake Pale, gray, or blue-colored skin, lips, or nail beds, depending on skin tone *This list is not all possible symptoms. Please call your medical provider for any other symptoms that are severe or concerning to you. Call 911 or call ahead to your local emergency facility: Notify the operator that you are seeking care for someone who has or may have COVID-19. Call ahead before visiting your doctor Call ahead. Many medical visits for routine care are being postponed or done by phone or telemedicine. If you have a medical  appointment that cannot be postponed, call your doctor's office, and tell them you have or may have COVID-19. This will help the office protect themselves and other patients. If you are sick, wear a well-fitting mask You should wear a mask if you must be around other people or animals, including  pets (even at home). Wear a mask with the best fit, protection, and comfort for you. You don't need to wear the mask if you are alone. If you can't put on a mask (because of trouble breathing, for example), cover your coughs and sneezes in some other way. Try to stay at least 6 feet away from other people. This will help protect the people around you. Masks should not be placed on young children under age 82 years, anyone who has trouble breathing, or anyone who is not able to remove the mask without help. Cover your coughs and sneezes Cover your mouth and nose with a tissue when you cough or sneeze. Throw away used tissues in a lined trash can. Immediately wash your hands with soap and water for at least 20 seconds. If soap and water are not available, clean your hands with an alcohol-based hand sanitizer that contains at least 60% alcohol. Clean your hands often Wash your hands often with soap and water for at least 20 seconds. This is especially important after blowing your nose, coughing, or sneezing; going to the bathroom; and before eating or preparing food. Use hand sanitizer if soap and water are not available. Use an alcohol-based hand sanitizer with at least 60% alcohol, covering all surfaces of your hands and rubbing them together until they feel dry. Soap and water are the best option, especially if hands are visibly dirty. Avoid touching your eyes, nose, and mouth with unwashed hands. Handwashing Tips Avoid sharing personal household items Do not share dishes, drinking glasses, cups, eating utensils, towels, or bedding with other people in your home. Wash these items thoroughly after using them  with soap and water or put in the dishwasher. Clean surfaces in your home regularly Clean and disinfect high-touch surfaces (for example, doorknobs, tables, handles, light switches, and countertops) in your "sick room" and bathroom. In shared spaces, you should clean and disinfect surfaces and items after each use by the person who is ill. If you are sick and cannot clean, a caregiver or other person should only clean and disinfect the area around you (such as your bedroom and bathroom) on an as needed basis. Your caregiver/other person should wait as long as possible (at least several hours) and wear a mask before entering, cleaning, and disinfecting shared spaces that you use. Clean and disinfect areas that may have blood, stool, or body fluids on them. Use household cleaners and disinfectants. Clean visible dirty surfaces with household cleaners containing soap or detergent. Then, use a household disinfectant. Use a product from [employer] List N: Disinfectants for Coronavirus (COVID-19). Be sure to follow the instructions on the label to ensure safe and effective use of the product. Many products recommend keeping the surface wet with a disinfectant for a certain period of time (look at "contact time" on the product label). You may also need to wear personal protective equipment, such as gloves, depending on the directions on the product label. Immediately after disinfecting, wash your hands with soap and water for 20 seconds. For completed guidance on cleaning and disinfecting your home, visit Complete Disinfection Guidance. Take steps to improve ventilation at home Improve ventilation (air flow) at home to help prevent from spreading COVID-19 to other people in your household. Clear out COVID-19 virus particles in the air by opening windows, using air filters, and turning on fans in your home. Use this interactive tool to learn how to improve air flow in your home. When you can be around others  after  being sick with COVID-19 Deciding when you can be around others is different for different situations. Find out when you can safely end home isolation. For any additional questions about your care, contact your healthcare provider or state or local health department. 05/17/2020 Content source: [facility] ([facility]), Division of Viral Diseases This information is not intended to replace advice given to you by your health care provider. Make sure you discuss any questions you have with your health care provider. Document Revised: 06/30/2020 Document Reviewed: 06/30/2020 Elsevier Patient Education  2022 [employer].     If you have been instructed to have an in-person evaluation today at a local Urgent Care facility, please use the link below. It will take you to a list of all of our available [facility], including address, phone number and hours of operation. Please do not delay care.  [facility]  If you or a family member do not have a primary care provider, use the link below to schedule a visit and establish care. When you choose a [facility] primary care physician or advanced practice provider, you gain a long-term partner in health. Find a Primary Care Provider  Learn more about [facility]'s in-office and virtual care options:  - Get Care Now

## 2022-11-23 ENCOUNTER — Other Ambulatory Visit: Payer: Self-pay | Admitting: Family Medicine

## 2022-11-26 ENCOUNTER — Ambulatory Visit: Payer: Medicare PPO | Admitting: Family Medicine

## 2022-11-28 DIAGNOSIS — R310 Gross hematuria: Secondary | ICD-10-CM | POA: Diagnosis not present

## 2022-11-28 DIAGNOSIS — R361 Hematospermia: Secondary | ICD-10-CM | POA: Diagnosis not present

## 2022-12-10 ENCOUNTER — Encounter: Payer: Self-pay | Admitting: Family Medicine

## 2022-12-10 ENCOUNTER — Ambulatory Visit: Payer: Medicare PPO | Admitting: Family Medicine

## 2022-12-10 VITALS — BP 123/75 | HR 56 | Wt 192.0 lb

## 2022-12-10 DIAGNOSIS — Z23 Encounter for immunization: Secondary | ICD-10-CM | POA: Diagnosis not present

## 2022-12-10 DIAGNOSIS — E119 Type 2 diabetes mellitus without complications: Secondary | ICD-10-CM | POA: Diagnosis not present

## 2022-12-10 DIAGNOSIS — Z7984 Long term (current) use of oral hypoglycemic drugs: Secondary | ICD-10-CM

## 2022-12-10 DIAGNOSIS — I10 Essential (primary) hypertension: Secondary | ICD-10-CM

## 2022-12-10 DIAGNOSIS — E7800 Pure hypercholesterolemia, unspecified: Secondary | ICD-10-CM

## 2022-12-10 DIAGNOSIS — I880 Nonspecific mesenteric lymphadenitis: Secondary | ICD-10-CM | POA: Diagnosis not present

## 2022-12-10 DIAGNOSIS — R310 Gross hematuria: Secondary | ICD-10-CM | POA: Diagnosis not present

## 2022-12-10 DIAGNOSIS — N200 Calculus of kidney: Secondary | ICD-10-CM | POA: Diagnosis not present

## 2022-12-10 LAB — COMPREHENSIVE METABOLIC PANEL
ALT: 13 U/L (ref 0–53)
AST: 16 U/L (ref 0–37)
Albumin: 4.2 g/dL (ref 3.5–5.2)
Alkaline Phosphatase: 65 U/L (ref 39–117)
BUN: 22 mg/dL (ref 6–23)
CO2: 28 meq/L (ref 19–32)
Calcium: 10 mg/dL (ref 8.4–10.5)
Chloride: 101 meq/L (ref 96–112)
Creatinine, Ser: 0.95 mg/dL (ref 0.40–1.50)
GFR: 79.14 mL/min (ref >60.00)
Glucose, Bld: 121 mg/dL — ABNORMAL HIGH (ref 70–99)
Potassium: 3.4 meq/L — ABNORMAL LOW (ref 3.5–5.1)
Sodium: 138 meq/L (ref 135–145)
Total Bilirubin: 1 mg/dL (ref 0.2–1.2)
Total Protein: 6.7 g/dL (ref 6.0–8.3)

## 2022-12-10 LAB — LIPID PANEL
Cholesterol: 88 mg/dL (ref 0–200)
HDL: 38.6 mg/dL — ABNORMAL LOW (ref >39.00)
LDL Cholesterol: 32 mg/dL (ref 0–99)
NonHDL: 49.58
Total CHOL/HDL Ratio: 2
Triglycerides: 87 mg/dL (ref 0.0–149.0)
VLDL: 17.4 mg/dL (ref 0.0–40.0)

## 2022-12-10 LAB — POCT GLYCOSYLATED HEMOGLOBIN (HGB A1C)
HbA1c POC (<> result, manual entry): 6.4 % (ref 4.0–5.6)
HbA1c, POC (controlled diabetic range): 6.4 % (ref 0.0–7.0)
HbA1c, POC (prediabetic range): 6.4 % (ref 5.7–6.4)
Hemoglobin A1C: 6.4 % — AB (ref 4.0–5.6)

## 2022-12-10 MED ORDER — METFORMIN HCL ER 500 MG PO TB24: ORAL_TABLET | ORAL | 1 refills | Status: DC

## 2022-12-10 MED ORDER — SILDENAFIL CITRATE 100 MG PO TABS: ORAL_TABLET | ORAL | 6 refills | Status: DC

## 2022-12-10 MED ORDER — POTASSIUM CHLORIDE CRYS ER 20 MEQ PO TBCR: 40.0000 meq | EXTENDED_RELEASE_TABLET | Freq: Two times a day (BID) | ORAL | 1 refills | Status: DC

## 2022-12-10 MED ORDER — HYDROCHLOROTHIAZIDE 25 MG PO TABS: 25.0000 mg | ORAL_TABLET | Freq: Every day | ORAL | 1 refills | Status: DC

## 2022-12-10 MED ORDER — ATORVASTATIN CALCIUM 40 MG PO TABS: 40.0000 mg | ORAL_TABLET | Freq: Every day | ORAL | 1 refills | Status: DC

## 2022-12-10 NOTE — Progress Notes (Signed)
OFFICE VISIT  12/10/2022  CC:  Chief Complaint  Patient presents with   Medical Management of Chronic Issues    Pt is fasting.     Patient is a 71 y.o. male who presents for 6 mo f/u DM, HTN, HLD.  INTERIM HX: Feeling well.  Diet and exercise going well.   ROS as above, plus--> no fevers, no CP, no SOB, no wheezing, no cough, no dizziness, no HAs, no rashes, no melena/hematochezia.  No polyuria or polydipsia.  No myalgias or arthralgias.  No focal weakness, paresthesias, or tremors.  No acute vision or hearing abnormalities.  No dysuria or unusual/new urinary urgency or frequency, no hematuria.  No recent changes in lower legs. No n/v/d or abd pain.  No palpitations.     Past Medical History:  Diagnosis Date   Atrial fibrillation (HCC)    11/2021   CAD (coronary artery disease)    CT demonstrated nonobstructive plaques (this test was prompted by borderline ETT.   Diabetes mellitus without complication (HCC) dx'd 08/2011   Fasting gluc 136 and HbA1c 6.7% at diagnosis.    Diverticulosis    GERD (gastroesophageal reflux disease)    occ   Hepatic steatosis 07/2020   noted on CT abd/pelv done for GE illness   History of colitis 08/06/2020   presumed infectious. CT abd/pelv w/contrast at [facility]->long segment circumferential wall thickening of descending colon   History of nonmelanoma skin cancer    BCC   Hx of adenomatous colonic polyps    Hyperlipidemia    Hypertension    w/hypertensive retinopathy   Internal hemorrhoids    Obesity, Class II, BMI 35-39.9    Osteoarthritis of both knees    mod to severe 2022, steroid injections by ortho helped some.  Visco injections on the right --no significant help.   UTI (urinary tract infection) due to urinary indwelling catheter (HCC)    05/2022 (e coli and enterococcus)    Past Surgical History:  Procedure Laterality Date   CARDIOVASCULAR STRESS TEST  02/2013   2015 LexiScan/low-level exercise-Myoview (02/2013): No  ischemia, EF 53%, occ symptomatic PVCs.  03/2022 NORMAL/LOW RISK   COLONOSCOPY  02/2021   2023 normal.  Recall 5 yrs   COLONOSCOPY W/ POLYPECTOMY  2005;08/21/10;11/29/15   adenomatous; also mild diverticulosis; recall 2017.  iFOB neg 09/2011.  11/2015: tubular adenoma x 1. 02/2021 normal->recall 75yrs   HAMMER TOE SURGERY Right 05/24/2020   MOHS SURGERY  2018   head, and 2021 on Lt cheek   remvoal sebaceous cyst  15-20 years ago   TONSILLECTOMY     as a child   TOTAL KNEE ARTHROPLASTY Right 05/29/2022   Procedure: TOTAL KNEE ARTHROPLASTY;  Surgeon: Sheral Apley, MD;  Location: [facility];  Service: Orthopedics;  Laterality: Right;   TRANSTHORACIC ECHOCARDIOGRAM     12/2021 EF 55-60%, grd I DD, mild LAE    Outpatient Medications Prior to Visit  Medication Sig Dispense Refill   apixaban (ELIQUIS) 5 MG TABS tablet Take 1 tablet (5 mg total) by mouth 2 (two) times daily. 60 tablet 5   diclofenac Sodium (VOLTAREN) 1 % GEL Apply 4 g topically 4 (four) times daily. (Patient taking differently: Apply 4 g topically daily as needed (pain).) 100 g 3   glucose blood (FREESTYLE LITE) test strip USE TO CHECK BLOOD SUGAR 1-2 TIMES DAILY AS INSTRUCTED. 100 each 6   albuterol (VENTOLIN HFA) 108 (90 Base) MCG/ACT inhaler Inhale 1-2 puffs into the lungs every 6 (  six) hours as needed for wheezing or shortness of breath. 8 g 0   benzonatate (TESSALON) 200 MG capsule Take 1 capsule (200 mg total) by mouth 2 (two) times daily as needed for cough. 20 capsule 0   fluticasone (FLONASE) 50 MCG/ACT nasal spray Place 2 sprays into both nostrils daily. 16 g 0   metoprolol tartrate (LOPRESSOR) 25 MG tablet Take 1 tablet (25 mg total) by mouth as needed (racing heart beat). Take as needed for racing heart beat (Patient not taking: Reported on 12/10/2022) 60 tablet 1   acetaminophen (TYLENOL) 500 MG tablet Take 2 tablets (1,000 mg total) by mouth every 6 (six) hours as needed for mild pain or moderate pain. 60 tablet 0    atorvastatin (LIPITOR) 40 MG tablet Take 1 tablet (40 mg total) by mouth daily. 90 tablet 3   hydrochlorothiazide (HYDRODIURIL) 25 MG tablet Take 1 tablet (25 mg total) by mouth daily. 90 tablet 3   metFORMIN (GLUCOPHAGE XR) 500 MG 24 hr tablet 2 tabs po daily with supper 180 tablet 3   potassium chloride SA (KLOR-CON M20) 20 MEQ tablet Take 2 tablets (40 mEq total) by mouth 2 (two) times daily. 360 tablet 3   sildenafil (VIAGRA) 100 MG tablet 1/2-1 tab po qd as needed.  Take 30-60 min prior to intercourse 15 tablet 6   sulfamethoxazole-trimethoprim (BACTRIM DS) 800-160 MG tablet 1 bid x 14d 28 tablet 0   traMADol (ULTRAM) 50 MG tablet Take 50-100 mg by mouth every 6 (six) hours as needed.     No facility-administered medications prior to visit.    No Known Allergies  Review of Systems As per HPI  PE:    12/10/2022   10:03 AM 06/20/2022    9:12 AM 06/12/2022   11:42 AM  Vitals with BMI  Height   5\' 6"   Weight 192 lbs 186 lbs 10 oz 189 lbs  BMI  30.13 30.52  Systolic 123 131 161  Diastolic 75 79 76  Pulse 56 69 74     Physical Exam  Gen: Alert, well appearing.  Patient is oriented to person, place, time, and situation. AFFECT: pleasant, lucid thought and speech CV: RRR no murmur EXT: no clubbing or cyanosis.  No pitting edema.  Foot exam -  no swelling, tenderness or skin or vascular lesions. Color and temperature is normal. Sensation is intact to monofilament testing except decreased perception on the plantar aspects of both big toes. Peripheral pulses are palpable. Toenails are normal.    LABS:  Last CBC Lab Results  Component Value Date   WBC 7.1 05/17/2022   HGB 13.0 05/17/2022   HCT 40.5 05/17/2022   MCV 91.6 05/17/2022   MCH 29.4 05/17/2022   RDW 14.6 05/17/2022   PLT 184 05/17/2022   Last metabolic panel Lab Results  Component Value Date   GLUCOSE 123 (H) 05/17/2022   NA 137 05/17/2022   K 3.6 05/17/2022   CL 101 05/17/2022   CO2 27 05/17/2022   BUN  21 05/17/2022   CREATININE 0.94 05/17/2022   GFRNONAA >60 05/17/2022   CALCIUM 9.5 05/17/2022   PROT 6.6 12/22/2021   ALBUMIN 4.4 12/22/2021   BILITOT 1.0 12/22/2021   ALKPHOS 61 12/22/2021   AST 22 12/22/2021   ALT 22 12/22/2021   ANIONGAP 9 05/17/2022   Last lipids Lab Results  Component Value Date   CHOL 97 05/24/2022   HDL 45.60 05/24/2022   LDLCALC 33 05/24/2022   TRIG 90.0 05/24/2022  CHOLHDL 2 05/24/2022   Last hemoglobin A1c Lab Results  Component Value Date   HGBA1C 6.8 (H) 08/22/2022   Last thyroid functions Lab Results  Component Value Date   TSH 3.35 12/22/2021   Lab Results  Component Value Date   PSA 0.80 05/24/2022   PSA 0.68 06/22/2021   PSA 0.89 05/31/2020   IMPRESSION AND PLAN:  #1 diabetes without complication.  Good control. Feet exam essentially normal today. POC Hba1c 6.4%, improved from 6.8% 4 mo ago and from 7.5% 7 mo ago. Continue metformin XR 500 mg, 2 q. Supper.  #2 hypertension, well-controlled on HCTZ 25 mg a day. Monitor electrolytes and creatinine today.  3.  Hypercholesterolemia, doing well on Lipitor 40 mg a day. LDL was 33 about 6 months ago. Lipid panel and hepatic panel today.  4.  PAF. Doing well on Eliquis.  He has metoprolol to use as needed but is only had to use it a couple of times in the last 10 months. He has seen electrophysiology but he has decided against any kind of ablation at this time.  #5 gross hematuria. We did treat him for prostatitis a couple of times.  He has seen urology now and he reports that a UA there showed no blood.  He is set to get a CT for further evaluation of this today.  Cystoscopy also planned.  An After Visit Summary was printed and given to the patient.  FOLLOW UP: Return in about 6 months (around 06/10/2023) for annual CPE (fasting). Next cpe 04/2023 Signed:  Santiago Bumpers, MD           12/10/2022

## 2022-12-18 ENCOUNTER — Telehealth: Payer: Self-pay

## 2022-12-18 ENCOUNTER — Other Ambulatory Visit (HOSPITAL_COMMUNITY): Payer: Self-pay

## 2022-12-18 NOTE — Telephone Encounter (Signed)
.  Pharmacy Patient Advocate Encounter   Received notification from CoverMyMeds that prior authorization for FreeStyle Lite Test strips is required/requested.   Insurance verification completed.   The patient is insured through [employer] .   Per test claim: PA required; PA submitted to [facility] via CoverMyMeds Key/confirmation #/EOC [id number] Status is pending

## 2022-12-19 ENCOUNTER — Other Ambulatory Visit (HOSPITAL_COMMUNITY): Payer: Self-pay

## 2022-12-19 NOTE — Telephone Encounter (Signed)
Pharmacy Patient Advocate Encounter  Received notification from [facility] that Prior Authorization for  FreeStyle Lite Test strips  has been APPROVED from 02/26/22 to 02/26/24. Ran test claim, Copay is $0. This test claim was processed through [facility]- copay amounts may vary at other pharmacies due to pharmacy/plan contracts, or as the patient moves through the different stages of their insurance plan.   PA #/Case ID/Reference #:  [id number]   Pharmacy aware

## 2023-01-03 ENCOUNTER — Telehealth: Payer: Medicare PPO | Admitting: Family Medicine

## 2023-01-03 ENCOUNTER — Encounter: Payer: Self-pay | Admitting: Family Medicine

## 2023-01-03 VITALS — HR 70 | Temp 97.8°F

## 2023-01-03 DIAGNOSIS — J069 Acute upper respiratory infection, unspecified: Secondary | ICD-10-CM | POA: Diagnosis not present

## 2023-01-03 DIAGNOSIS — J019 Acute sinusitis, unspecified: Secondary | ICD-10-CM | POA: Diagnosis not present

## 2023-01-03 MED ORDER — DOXYCYCLINE HYCLATE 100 MG PO CAPS: 100.0000 mg | ORAL_CAPSULE | Freq: Two times a day (BID) | ORAL | 0 refills | Status: AC

## 2023-01-03 NOTE — Progress Notes (Addendum)
Virtual Visit via Video Note  I connected with Michael Hodge  on 01/03/23 at 11:20 AM EST by a video enabled telemedicine application and verified that I am speaking with the correct person using two identifiers.  Location patient: [place] Location provider:work or home office Persons participating in the virtual visit: patient, provider  I discussed the limitations and requested verbal permission for telemedicine visit. The patient expressed understanding and agreed to proceed.   HPI: 72 year old male being seen today for respiratory symptoms. 1 week ago started getting significant nasal congestion, runny nose, postnasal drip, and cough.  Sinus pressure has increased, nasal mucus is thick.  Cough worse at night when he lays flat.  No wheezing or shortness of breath or chest pain.  No fever. Home COVID test was negative. He is going out of town on a hunting trip to [place] in about 4 days.  ROS: See pertinent positives and negatives per HPI.  Past Medical History:  Diagnosis Date   Atrial fibrillation (HCC)    11/2021   CAD (coronary artery disease)    CT demonstrated nonobstructive plaques (this test was prompted by borderline ETT.   Diabetes mellitus without complication (HCC) dx'd 08/2011   Fasting gluc 136 and HbA1c 6.7% at diagnosis.    Diverticulosis    GERD (gastroesophageal reflux disease)    occ   Hepatic steatosis 07/2020   noted on CT abd/pelv done for GE illness   History of colitis 08/06/2020   presumed infectious. CT abd/pelv w/contrast at [facility] med c[facility]->long segment circumferential wall thickening of descending colon   History of nonmelanoma skin cancer    BCC   Hx of adenomatous colonic polyps    Hyperlipidemia    Hypertension    w/hypertensive retinopathy   Internal hemorrhoids    Obesity, Class II, BMI 35-39.9    Osteoarthritis of both knees    mod to severe 2022, steroid injections by ortho helped some.  Visco injections on the right --no significant help.   UTI  (urinary tract infection) due to urinary indwelling catheter (HCC)    05/2022 (e coli and enterococcus)    Past Surgical History:  Procedure Laterality Date   CARDIOVASCULAR STRESS TEST  02/2013   2015 LexiScan/low-level exercise-Myoview (02/2013): No ischemia, EF 53%, occ symptomatic PVCs.  03/2022 NORMAL/LOW RISK   COLONOSCOPY  02/2021   2023 normal.  Recall 5 yrs   COLONOSCOPY W/ POLYPECTOMY  2005;08/21/10;11/29/15   adenomatous; also mild diverticulosis; recall 2017.  iFOB neg 09/2011.  11/2015: tubular adenoma x 1. 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 (Patient taking differently: Apply 4 g topically daily as  needed (pain).), Disp: 100 g, Rfl: 3   glucose blood (FREESTYLE LITE) test strip, USE TO CHECK BLOOD SUGAR 1-2 TIMES DAILY AS INSTRUCTED., Disp: 100 each, Rfl: 6   hydrochlorothiazide (HYDRODIURIL) 25 MG tablet, Take 1 tablet (25 mg total) by mouth daily., Disp: 90 tablet, Rfl: 1   metFORMIN (GLUCOPHAGE XR) 500 MG 24 hr tablet, 2 tabs po daily with supper, Disp: 180 tablet, Rfl: 1   potassium chloride SA (KLOR-CON M20) 20 MEQ tablet, Take 2 tablets (40 mEq total) by mouth 2 (two) times daily., Disp: 360 tablet, Rfl: 1   sildenafil (VIAGRA) 100 MG  tablet, 1/2-1 tab po qd as needed.  Take 30-60 min prior to intercourse, Disp: 15 tablet, Rfl: 6   metoprolol tartrate (LOPRESSOR) 25 MG tablet, Take 1 tablet (25 mg total) by mouth as needed (racing heart beat). Take as needed for racing heart beat (Patient not taking: Reported on 12/10/2022), Disp: 60 tablet, Rfl: 1  EXAM:  VITALS per patient if applicable:     01/03/2023   11:10 AM 12/10/2022   10:03 AM 06/20/2022    9:12 AM  Vitals with BMI  Weight  192 lbs 186 lbs 10 oz  BMI   30.13  Systolic  123 131  Diastolic  75 79  Pulse 70 56 69     GENERAL: alert, oriented, appears well and in no acute distress  HEENT: atraumatic, conjunttiva clear, no obvious abnormalities on inspection of external nose and ears  NECK: normal movements of the head and neck  LUNGS: on inspection no signs of respiratory distress, breathing rate appears normal, no obvious gross SOB, gasping or wheezing  CV: no obvious cyanosis  MS: moves all visible extremities without noticeable abnormality  PSYCH/NEURO: pleasant and cooperative, no obvious depression or anxiety, speech and thought processing grossly intact  LABS: none today    Chemistry      Component Value Date/Time   NA 138 12/10/2022 1035   NA 141 09/09/2017 0000   K 3.4 (L) 12/10/2022 1035   CL 101 12/10/2022 1035   CO2 28 12/10/2022 1035   BUN 22 12/10/2022 1035   BUN 29 (A) 09/09/2017 0000   CREATININE 0.95 12/10/2022 1035   GLU 117 09/09/2017 0000      Component Value Date/Time   CALCIUM 10.0 12/10/2022 1035   ALKPHOS 65 12/10/2022 1035   AST 16 12/10/2022 1035   ALT 13 12/10/2022 1035   BILITOT 1.0 12/10/2022 1035     ASSESSMENT AND PLAN:  Discussed the following assessment and plan:  URI/sinusitis. Doxycycline 100 mg bid x7d. Recommended OTC mucinex dm q12h prn. Okay to take Claritin 10 mg a day as well.  I discussed the assessment and treatment plan with the patient. The patient was provided an opportunity to ask  questions and all were answered. The patient agreed with the plan and demonstrated an understanding of the instructions.   F/u: if not signif imp in 5-7 d  Signed:  Santiago Bumpers, MD           01/03/2023

## 2023-02-08 DIAGNOSIS — R361 Hematospermia: Secondary | ICD-10-CM | POA: Diagnosis not present

## 2023-02-08 DIAGNOSIS — R310 Gross hematuria: Secondary | ICD-10-CM | POA: Diagnosis not present

## 2023-02-16 ENCOUNTER — Other Ambulatory Visit: Payer: Self-pay | Admitting: Cardiology

## 2023-02-16 DIAGNOSIS — I480 Paroxysmal atrial fibrillation: Secondary | ICD-10-CM

## 2023-02-18 NOTE — Telephone Encounter (Signed)
Prescription refill request for Eliquis received. Indication:afib Last office visit:3/24 Scr:0.95  10/24 Age: 72 Weight:87.1  kg  Prescription refilled

## 2023-03-12 ENCOUNTER — Other Ambulatory Visit: Payer: Self-pay | Admitting: Family Medicine

## 2023-03-14 DIAGNOSIS — L905 Scar conditions and fibrosis of skin: Secondary | ICD-10-CM | POA: Diagnosis not present

## 2023-03-14 DIAGNOSIS — H2513 Age-related nuclear cataract, bilateral: Secondary | ICD-10-CM | POA: Diagnosis not present

## 2023-03-14 DIAGNOSIS — H52223 Regular astigmatism, bilateral: Secondary | ICD-10-CM | POA: Diagnosis not present

## 2023-03-14 DIAGNOSIS — Z85828 Personal history of other malignant neoplasm of skin: Secondary | ICD-10-CM | POA: Diagnosis not present

## 2023-03-14 DIAGNOSIS — E119 Type 2 diabetes mellitus without complications: Secondary | ICD-10-CM | POA: Diagnosis not present

## 2023-03-14 DIAGNOSIS — D485 Neoplasm of uncertain behavior of skin: Secondary | ICD-10-CM | POA: Diagnosis not present

## 2023-03-14 DIAGNOSIS — H5213 Myopia, bilateral: Secondary | ICD-10-CM | POA: Diagnosis not present

## 2023-03-14 DIAGNOSIS — H35033 Hypertensive retinopathy, bilateral: Secondary | ICD-10-CM | POA: Diagnosis not present

## 2023-03-14 DIAGNOSIS — D044 Carcinoma in situ of skin of scalp and neck: Secondary | ICD-10-CM | POA: Diagnosis not present

## 2023-03-14 DIAGNOSIS — H40043 Steroid responder, bilateral: Secondary | ICD-10-CM | POA: Diagnosis not present

## 2023-03-14 DIAGNOSIS — I10 Essential (primary) hypertension: Secondary | ICD-10-CM | POA: Diagnosis not present

## 2023-03-14 DIAGNOSIS — H524 Presbyopia: Secondary | ICD-10-CM | POA: Diagnosis not present

## 2023-03-14 DIAGNOSIS — H53143 Visual discomfort, bilateral: Secondary | ICD-10-CM | POA: Diagnosis not present

## 2023-03-14 DIAGNOSIS — L570 Actinic keratosis: Secondary | ICD-10-CM | POA: Diagnosis not present

## 2023-03-14 LAB — HM DIABETES EYE EXAM

## 2023-03-26 ENCOUNTER — Encounter: Payer: Self-pay | Admitting: Family Medicine

## 2023-03-27 NOTE — Telephone Encounter (Signed)
Nurse team, pls help with this question b/c I don't know anything about it. thx

## 2023-04-12 DIAGNOSIS — M170 Bilateral primary osteoarthritis of knee: Secondary | ICD-10-CM | POA: Diagnosis not present

## 2023-04-25 ENCOUNTER — Telehealth: Payer: Medicare PPO | Admitting: Family Medicine

## 2023-04-25 ENCOUNTER — Encounter: Payer: Self-pay | Admitting: Family Medicine

## 2023-04-25 ENCOUNTER — Other Ambulatory Visit: Payer: Self-pay | Admitting: Family Medicine

## 2023-04-25 VITALS — BP 137/79 | HR 79 | Temp 98.4°F

## 2023-04-25 DIAGNOSIS — J209 Acute bronchitis, unspecified: Secondary | ICD-10-CM | POA: Diagnosis not present

## 2023-04-25 DIAGNOSIS — J069 Acute upper respiratory infection, unspecified: Secondary | ICD-10-CM

## 2023-04-25 MED ORDER — PREDNISONE 20 MG PO TABS: ORAL_TABLET | ORAL | 0 refills | Status: DC

## 2023-04-25 NOTE — Progress Notes (Signed)
 Virtual Visit via Video Note  I connected with Michael Hodge  on 04/25/23 at  3:20 PM EST by a video enabled telemedicine application and verified that I am speaking with the correct person using two identifiers.  Location patient: [place] Location provider:work or home office Persons participating in the virtual visit: patient, provider  I discussed the limitations and requested verbal permission for telemedicine visit. The patient expressed understanding and agreed to proceed.  CC:  cough  HPI: 73 year old male being seen today for cough. Approximately 7 to 8 days ago Michael Hodge started to have nasal congestion, scratchy throat, postnasal drip, and cough.  His symptoms have improved some but he still has some fits of coughing, particularly overnight.  Has some mucus production with a cough, said it is clear or white. Fever, no wheezing or shortness of breath.  ROS: See pertinent positives and negatives per HPI.  Past Medical History:  Diagnosis Date   Atrial fibrillation (HCC)    11/2021   CAD (coronary artery disease)    CT demonstrated nonobstructive plaques (this test was prompted by borderline ETT.   Diabetes mellitus without complication (HCC) dx'd 08/2011   Fasting gluc 136 and HbA1c 6.7% at diagnosis.    Diverticulosis    GERD (gastroesophageal reflux disease)    occ   Hepatic steatosis 07/2020   noted on CT abd/pelv done for GE illness   History of colitis 08/06/2020   presumed infectious. CT abd/pelv w/contrast at [facility] med ctr->long segment circumferential wall thickening of descending colon   History of nonmelanoma skin cancer    BCC   Hx of adenomatous colonic polyps    Hyperlipidemia    Hypertension    w/hypertensive retinopathy   Internal hemorrhoids    Obesity, Class II, BMI 35-39.9    Osteoarthritis of both knees    mod to severe 2022, steroid injections by ortho helped some.  Visco injections on the right --no significant help.   UTI (urinary tract infection) due to  urinary indwelling catheter (HCC)    05/2022 (e coli and enterococcus)    Past Surgical History:  Procedure Laterality Date   CARDIOVASCULAR STRESS TEST  02/2013   2015 LexiScan/low-level exercise-Myoview (02/2013): No ischemia, EF 53%, occ symptomatic PVCs.  03/2022 NORMAL/LOW RISK   COLONOSCOPY  02/2021   2023 normal.  Recall 5 yrs   COLONOSCOPY W/ POLYPECTOMY  2005;08/21/10;11/29/15   adenomatous; also mild diverticulosis; recall 2017.  iFOB neg 09/2011.  11/2015: tubular adenoma x 1. 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   Procedure: TOTAL KNEE ARTHROPLASTY;  Surgeon: Sheral Apley, MD;  Location: [facility];  Service: Orthopedics;  Laterality: Right;   TRANSTHORACIC ECHOCARDIOGRAM     12/2021 EF 55-60%, grd I DD, mild LAE     Current Outpatient Medications:    atorvastatin (LIPITOR) 40 MG tablet, Take 1 tablet (40 mg total) by mouth daily., Disp: 90 tablet, Rfl: 1   diclofenac Sodium (VOLTAREN) 1 % GEL, Apply 4 g topically daily as needed (pain)., Disp: 100 g, Rfl: 0   ELIQUIS 5 MG TABS tablet, TAKE 1 TABLET BY MOUTH 2 TIMES A DAY, Disp: 60 tablet, Rfl: 5   glucose blood (FREESTYLE LITE) test strip, USE TO CHECK BLOOD SUGAR 1-2 TIMES DAILY  AS INSTRUCTED., Disp: 100 each, Rfl: 6   hydrochlorothiazide (HYDRODIURIL) 25 MG tablet, Take 1 tablet (25 mg total) by mouth daily., Disp: 90 tablet, Rfl: 1   metFORMIN (GLUCOPHAGE XR) 500 MG 24 hr tablet, 2 tabs po daily with supper, Disp: 180 tablet, Rfl: 1   potassium chloride SA (KLOR-CON M20) 20 MEQ tablet, Take 2 tablets (40 mEq total) by mouth 2 (two) times daily., Disp: 360 tablet, Rfl: 1   sildenafil (VIAGRA) 100 MG tablet, 1/2-1 tab po qd as needed.  Take 30-60 min prior to intercourse, Disp: 15 tablet, Rfl: 6   metoprolol tartrate (LOPRESSOR) 25 MG  tablet, Take 1 tablet (25 mg total) by mouth as needed (racing heart beat). Take as needed for racing heart beat (Patient not taking: Reported on 12/10/2022), Disp: 60 tablet, Rfl: 1  EXAM:  VITALS per patient if applicable:     04/25/2023    3:12 PM 01/03/2023   11:10 AM 12/10/2022   10:03 AM  Vitals with BMI  Weight   192 lbs  Systolic 137  123  Diastolic 79  75  Pulse 79 70 56     GENERAL: alert, oriented, appears well and in no acute distress  HEENT: atraumatic, conjunttiva clear, no obvious abnormalities on inspection of external nose and ears  NECK: normal movements of the head and neck  LUNGS: on inspection no signs of respiratory distress, breathing rate appears normal, no obvious gross SOB, gasping or wheezing  CV: no obvious cyanosis  MS: moves all visible extremities without noticeable abnormality  PSYCH/NEURO: pleasant and cooperative, no obvious depression or anxiety, speech and thought processing grossly intact  LABS: none today    Chemistry      Component Value Date/Time   NA 138 12/10/2022 1035   NA 141 09/09/2017 0000   K 3.4 (L) 12/10/2022 1035   CL 101 12/10/2022 1035   CO2 28 12/10/2022 1035   BUN 22 12/10/2022 1035   BUN 29 (A) 09/09/2017 0000   CREATININE 0.95 12/10/2022 1035   GLU 117 09/09/2017 0000      Component Value Date/Time   CALCIUM 10.0 12/10/2022 1035   ALKPHOS 65 12/10/2022 1035   AST 16 12/10/2022 1035   ALT 13 12/10/2022 1035   BILITOT 1.0 12/10/2022 1035     Lab Results  Component Value Date   HGBA1C 6.4 (A) 12/10/2022   HGBA1C 6.4 12/10/2022   HGBA1C 6.4 12/10/2022   HGBA1C 6.4 12/10/2022   ASSESSMENT AND PLAN:  Discussed the following assessment and plan:  URI with cough and congestion, lingering symptoms. Prednisone 40 mg a day x 5 days prescribed today. Continue Claritin and Mucinex DM.   I discussed the assessment and treatment plan with the patient. The patient was provided an opportunity to ask questions  and all were answered. The patient agreed with the plan and demonstrated an understanding of the instructions.   F/u:  Appointment set for 06/10/2023 Signed:  Santiago Bumpers, MD           04/25/2023

## 2023-05-15 ENCOUNTER — Ambulatory Visit (INDEPENDENT_AMBULATORY_CARE_PROVIDER_SITE_OTHER): Payer: Medicare PPO | Admitting: *Deleted

## 2023-05-15 DIAGNOSIS — Z0000 Encounter for general adult medical examination without abnormal findings: Secondary | ICD-10-CM | POA: Diagnosis not present

## 2023-05-15 NOTE — Patient Instructions (Signed)
 Michael Hodge , Thank you for taking time to come for your Medicare Wellness Visit. I appreciate your ongoing commitment to your health goals. Please review the following plan we discussed and let me know if I can assist you in the future.   Screening recommendations/referrals: Colonoscopy: up to date Recommended yearly ophthalmology/optometry visit for glaucoma screening and checkup Recommended yearly dental visit for hygiene and checkup  Vaccinations: Influenza vaccine: up to date Pneumococcal vaccine: up to date Tdap vaccine: up to date Shingles vaccine: up to date    Advanced directives: yes     Preventive Care 65 Years and Older, Male Preventive care refers to lifestyle choices and visits with your health care provider that can promote health and wellness. What does preventive care include? A yearly physical exam. This is also called an annual well check. Dental exams once or twice a year. Routine eye exams. Ask your health care provider how often you should have your eyes checked. Personal lifestyle choices, including: Daily care of your teeth and gums. Regular physical activity. Eating a healthy diet. Avoiding tobacco and drug use. Limiting alcohol use. Practicing safe sex. Taking low doses of aspirin every day. Taking vitamin and mineral supplements as recommended by your health care provider. What happens during an annual well check? The services and screenings done by your health care provider during your annual well check will depend on your age, overall health, lifestyle risk factors, and family history of disease. Counseling  Your health care provider may ask you questions about your: Alcohol use. Tobacco use. Drug use. Emotional well-being. Home and relationship well-being. Sexual activity. Eating habits. History of falls. Memory and ability to understand (cognition). Work and work Astronomer. Screening  You may have the following tests or  measurements: Height, weight, and BMI. Blood pressure. Lipid and cholesterol levels. These may be checked every 5 years, or more frequently if you are over 56 years old. Skin check. Lung cancer screening. You may have this screening every year starting at age 34 if you have a 30-pack-year history of smoking and currently smoke or have quit within the past 15 years. Fecal occult blood test (FOBT) of the stool. You may have this test every year starting at age 54. Flexible sigmoidoscopy or colonoscopy. You may have a sigmoidoscopy every 5 years or a colonoscopy every 10 years starting at age 51. Prostate cancer screening. Recommendations will vary depending on your family history and other risks. Hepatitis C blood test. Hepatitis B blood test. Sexually transmitted disease (STD) testing. Diabetes screening. This is done by checking your blood sugar (glucose) after you have not eaten for a while (fasting). You may have this done every 1-3 years. Abdominal aortic aneurysm (AAA) screening. You may need this if you are a current or former smoker. Osteoporosis. You may be screened starting at age 18 if you are at high risk. Talk with your health care provider about your test results, treatment options, and if necessary, the need for more tests. Vaccines  Your health care provider may recommend certain vaccines, such as: Influenza vaccine. This is recommended every year. Tetanus, diphtheria, and acellular pertussis (Tdap, Td) vaccine. You may need a Td booster every 10 years. Zoster vaccine. You may need this after age 52. Pneumococcal 13-valent conjugate (PCV13) vaccine. One dose is recommended after age 39. Pneumococcal polysaccharide (PPSV23) vaccine. One dose is recommended after age 47. Talk to your health care provider about which screenings and vaccines you need and how often you need them.  This information is not intended to replace advice given to you by your health care provider. Make sure  you discuss any questions you have with your health care provider. Document Released: 03/11/2015 Document Revised: 11/02/2015 Document Reviewed: 12/14/2014 Elsevier Interactive Patient Education  2017 [employer].  Fall Prevention in the Home Falls can cause injuries. They can happen to people of all ages. There are many things you can do to make your home safe and to help prevent falls. What can I do on the outside of my home? Regularly fix the edges of walkways and driveways and fix any cracks. Remove anything that might make you trip as you walk through a door, such as a raised step or threshold. Trim any bushes or trees on the path to your home. Use bright outdoor lighting. Clear any walking paths of anything that might make someone trip, such as rocks or tools. Regularly check to see if handrails are loose or broken. Make sure that both sides of any steps have handrails. Any raised decks and porches should have guardrails on the edges. Have any leaves, snow, or ice cleared regularly. Use sand or salt on walking paths during winter. Clean up any spills in your garage right away. This includes oil or grease spills. What can I do in the bathroom? Use night lights. Install grab bars by the toilet and in the tub and shower. Do not use towel bars as grab bars. Use non-skid mats or decals in the tub or shower. If you need to sit down in the shower, use a plastic, non-slip stool. Keep the floor dry. Clean up any water that spills on the floor as soon as it happens. Remove soap buildup in the tub or shower regularly. Attach bath mats securely with double-sided non-slip rug tape. Do not have throw rugs and other things on the floor that can make you trip. What can I do in the bedroom? Use night lights. Make sure that you have a light by your bed that is easy to reach. Do not use any sheets or blankets that are too big for your bed. They should not hang down onto the floor. Have a firm  chair that has side arms. You can use this for support while you get dressed. Do not have throw rugs and other things on the floor that can make you trip. What can I do in the kitchen? Clean up any spills right away. Avoid walking on wet floors. Keep items that you use a lot in easy-to-reach places. If you need to reach something above you, use a strong step stool that has a grab bar. Keep electrical cords out of the way. Do not use floor polish or wax that makes floors slippery. If you must use wax, use non-skid floor wax. Do not have throw rugs and other things on the floor that can make you trip. What can I do with my stairs? Do not leave any items on the stairs. Make sure that there are handrails on both sides of the stairs and use them. Fix handrails that are broken or loose. Make sure that handrails are as long as the stairways. Check any carpeting to make sure that it is firmly attached to the stairs. Fix any carpet that is loose or worn. Avoid having throw rugs at the top or bottom of the stairs. If you do have throw rugs, attach them to the floor with carpet tape. Make sure that you have a light switch at the  top of the stairs and the bottom of the stairs. If you do not have them, ask someone to add them for you. What else can I do to help prevent falls? Wear shoes that: Do not have high heels. Have rubber bottoms. Are comfortable and fit you well. Are closed at the toe. Do not wear sandals. If you use a stepladder: Make sure that it is fully opened. Do not climb a closed stepladder. Make sure that both sides of the stepladder are locked into place. Ask someone to hold it for you, if possible. Clearly mark and make sure that you can see: Any grab bars or handrails. First and last steps. Where the edge of each step is. Use tools that help you move around (mobility aids) if they are needed. These include: Canes. Walkers. Scooters. Crutches. Turn on the lights when you go  into a dark area. Replace any light bulbs as soon as they burn out. Set up your furniture so you have a clear path. Avoid moving your furniture around. If any of your floors are uneven, fix them. If there are any pets around you, be aware of where they are. Review your medicines with your doctor. Some medicines can make you feel dizzy. This can increase your chance of falling. Ask your doctor what other things that you can do to help prevent falls. This information is not intended to replace advice given to you by your health care provider. Make sure you discuss any questions you have with your health care provider. Document Released: 12/09/2008 Document Revised: 07/21/2015 Document Reviewed: 03/19/2014 Elsevier Interactive Patient Education  2017 [employer].

## 2023-05-15 NOTE — Progress Notes (Signed)
 Subjective:   Michael Hodge. is a 73 y.o. male who presents for Medicare Annual/Subsequent preventive examination.  Visit Complete: Virtual I connected with  Michael Hodge. on 05/15/23 by a audio enabled telemedicine application and verified that I am speaking with the correct person using two identifiers.  Patient Location: Home  Provider Location: Home Office  I discussed the limitations of evaluation and management by telemedicine. The patient expressed understanding and agreed to proceed.  Vital Signs: Because this visit was a virtual/telehealth visit, some criteria may be missing or patient reported. Any vitals not documented were not able to be obtained and vitals that have been documented are patient reported.  Cardiac Risk Factors include: diabetes mellitus;male gender;hypertension;advanced age (>85men, >60 women)     Objective:    There were no vitals filed for this visit. There is no height or weight on file to calculate BMI.     05/15/2023    3:45 PM 05/17/2022    9:41 AM 05/09/2022    3:54 PM 04/26/2021    9:06 AM 04/13/2020    1:40 PM 11/30/2016    8:49 AM 11/07/2015    1:07 PM  Advanced Directives  Does Patient Have a Medical Advance Directive? Yes Yes Yes Yes Yes Yes No  Type of Advance Directive Living will;Healthcare Power of [employer] Power of [place];Living will Healthcare Power of [employer] of [place];Living will Healthcare Power of [place];Living will   Copy of Healthcare Power of Attorney in Chart? No - copy requested  No - copy requested No - copy requested No - copy requested No - copy requested     Current Medications (verified) Outpatient Encounter Medications as of 05/15/2023  Medication Sig   atorvastatin (LIPITOR) 40 MG tablet Take 1 tablet (40 mg total) by mouth daily.   diclofenac Sodium (VOLTAREN) 1 % GEL Apply 4 g topically daily as needed (pain).   ELIQUIS 5 MG TABS tablet TAKE 1 TABLET BY MOUTH 2 TIMES A DAY    glucose blood (FREESTYLE LITE) test strip USE TO CHECK BLOOD SUGAR 1-2 TIMES DAILY AS INSTRUCTED.   hydrochlorothiazide (HYDRODIURIL) 25 MG tablet Take 1 tablet (25 mg total) by mouth daily.   metFORMIN (GLUCOPHAGE-XR) 500 MG 24 hr tablet TAKE 2 TABLETS BY MOUTH DAILY WITH DINNER   metoprolol tartrate (LOPRESSOR) 25 MG tablet Take 1 tablet (25 mg total) by mouth as needed (racing heart beat). Take as needed for racing heart beat   potassium chloride SA (KLOR-CON M20) 20 MEQ tablet Take 2 tablets (40 mEq total) by mouth 2 (two) times daily.   predniSONE (DELTASONE) 20 MG tablet 2 tabs po every day x 5d   sildenafil (VIAGRA) 100 MG tablet 1/2-1 tab po qd as needed.  Take 30-60 min prior to intercourse   No facility-administered encounter medications on file as of 05/15/2023.    Allergies (verified) Patient has no known allergies.   History: Past Medical History:  Diagnosis Date   Atrial fibrillation (HCC)    11/2021   CAD (coronary artery disease)    CT demonstrated nonobstructive plaques (this test was prompted by borderline ETT.   Diabetes mellitus without complication (HCC) dx'd 08/2011   Fasting gluc 136 and HbA1c 6.7% at diagnosis.    Diverticulosis    GERD (gastroesophageal reflux disease)    occ   Hepatic steatosis 07/2020   noted on CT abd/pelv done for GE illness   History of colitis 08/06/2020   presumed infectious.  CT abd/pelv w/contrast at [facility]->long segment circumferential wall thickening of descending colon   History of nonmelanoma skin cancer    BCC   Hx of adenomatous colonic polyps    Hyperlipidemia    Hypertension    w/hypertensive retinopathy   Internal hemorrhoids    Obesity, Class II, BMI 35-39.9    Osteoarthritis of both knees    mod to severe 2022, steroid injections by ortho helped some.  Visco injections on the right --no significant help.   UTI (urinary tract infection) due to urinary indwelling catheter (HCC)    05/2022 (e coli and  enterococcus)   Past Surgical History:  Procedure Laterality Date   CARDIOVASCULAR STRESS TEST  02/2013   2015 LexiScan/low-level exercise-Myoview (02/2013): No ischemia, EF 53%, occ symptomatic PVCs.  03/2022 NORMAL/LOW RISK   COLONOSCOPY  02/2021   2023 normal.  Recall 5 yrs   COLONOSCOPY W/ POLYPECTOMY  2005;08/21/10;11/29/15   adenomatous; also mild diverticulosis; recall 2017.  iFOB neg 09/2011.  11/2015: tubular adenoma x 1. 02/2021 normal->recall 22yrs   HAMMER TOE SURGERY Right 05/24/2020   MOHS SURGERY  2018   head, and 2021 on Lt cheek   remvoal sebaceous cyst  15-20 years ago   TONSILLECTOMY     as a child   TOTAL KNEE ARTHROPLASTY Right 05/29/2022   Procedure: TOTAL KNEE ARTHROPLASTY;  Surgeon: Sheral Apley, MD;  Location: [facility];  Service: Orthopedics;  Laterality: Right;   TRANSTHORACIC ECHOCARDIOGRAM     12/2021 EF 55-60%, grd I DD, mild LAE   Family History  Problem Relation Age of Onset   Heart attack Mother    Diabetes Father    Stroke Father    Stroke Other    Colon cancer Neg Hx    Colon polyps Neg Hx    Rectal cancer Neg Hx    Stomach cancer Neg Hx    Esophageal cancer Neg Hx    Social History   Socioeconomic History   Marital status: Married    Spouse name: Not on file   Number of children: Not on file   Years of education: Not on file   Highest education level: Bachelor's degree (e.g., BA, AB, BS)  Occupational History   Not on file  Tobacco Use   Smoking status: Never   Smokeless tobacco: Never  Vaping Use   Vaping status: Never Used  Substance and Sexual Activity   Alcohol use: Yes    Alcohol/week: 3.0 standard drinks of alcohol    Types: 3 Shots of liquor per week    Comment: occ   Drug use: No   Sexual activity: Yes  Other Topics Concern   Not on file  Social History Narrative   Married x 40 yrs.   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   No T/A/Ds.   Social Drivers of Corporate investment banker Strain:  Low Risk  (05/15/2023)   Overall Financial Resource Strain (CARDIA)    Difficulty of Paying Living Expenses: Not hard at all  Food Insecurity: No Food Insecurity (05/15/2023)   Hunger Vital Sign    Worried About Running Out of Food in the Last Year: Never true    Ran Out of Food in the Last Year: Never true  Transportation Needs: No Transportation Needs (05/15/2023)   PRAPARE - Administrator, Civil Service (Medical): No    Lack of Transportation (Non-Medical): No  Physical Activity: Insufficiently Active (05/15/2023)  Exercise Vital Sign    Days of Exercise per Week: 3 days    Minutes of Exercise per Session: 30 min  Stress: No Stress Concern Present (05/15/2023)   [employer] of Occupational Health - Occupational Stress Questionnaire    Feeling of Stress : Not at all  Social Connections: Socially Integrated (05/15/2023)   Social Connection and Isolation Panel [NHANES]    Frequency of Communication with Friends and Family: More than three times a week    Frequency of Social Gatherings with Friends and Family: More than three times a week    Attends Religious Services: More than 4 times per year    Active Member of [employer] or Organizations: Yes    Attends Banker Meetings: 1 to 4 times per year    Marital Status: Married    Tobacco Counseling Counseling given: Not Answered   Clinical Intake:  Pre-visit preparation completed: Yes  Pain : No/denies pain     Diabetes: Yes CBG done?: No Did pt. bring in CBG monitor from home?: No  How often do you need to have someone help you when you read instructions, pamphlets, or other written materials from your doctor or pharmacy?: 1 - Never  Interpreter Needed?: No  Information entered by :: Remi Haggard LPN   Activities of Daily Living    05/15/2023    3:46 PM 05/17/2022    9:44 AM  In your present state of health, do you have any difficulty performing the following activities:  Hearing? 0   Vision? 0    Difficulty concentrating or making decisions? 0   Walking or climbing stairs? 0   Dressing or bathing? 0   Doing errands, shopping? 0 0  Preparing Food and eating ? N   Using the Toilet? N   In the past six months, have you accidently leaked urine? N   Do you have problems with loss of bowel control? N   Managing your Medications? N   Managing your Finances? N   Housekeeping or managing your Housekeeping? N     Patient Care Team: Jeoffrey Massed, MD as PCP - General (Family Medicine) Jake Bathe, MD as PCP - Cardiology (Cardiology) Regan Lemming, MD as PCP - Electrophysiology (Cardiology) Blima Ledger, OD as Consulting Physician (Optometry) Hilarie Fredrickson, MD as Consulting Physician (Gastroenterology) Rollene Rotunda, MD as Consulting Physician (Cardiology) Regal, Kirstie Peri, DPM as Consulting Physician (Podiatry) Sheral Apley, MD as Consulting Physician (Orthopedic Surgery) Jake Bathe, MD as Consulting Physician (Cardiology)  Indicate any recent Medical Services you may have received from other than [facility] providers in the past year (date may be approximate).     Assessment:   This is a routine wellness examination for Michael Hodge.  Hearing/Vision screen Vision Screening - Comments:: [facility]  Up to date   Goals Addressed             This Visit's Progress    Increase physical activity       Patient Stated   Not on track    Lose a few pounds     Patient Stated   Not on track    Drop a few pounds      Patient Stated   On track    Get knee replaced        Depression Screen    05/15/2023    3:47 PM 12/10/2022   10:08 AM 05/09/2022    3:52 PM 06/22/2021   10:02 AM 04/26/2021  9:05 AM 12/22/2020   10:02 AM 04/13/2020    1:42 PM  PHQ 2/9 Scores  PHQ - 2 Score 0 0 0 0 0 0 0  PHQ- 9 Score 0          Fall Risk    05/15/2023    3:44 PM 12/10/2022   10:08 AM 06/19/2022    5:48 PM 05/09/2022    3:55 PM 03/04/2022    9:50 PM  Fall Risk   Falls in  the past year? 0 0 0 0 0  Number falls in past yr: 0   0 0  Injury with Fall? 0   0 0  Risk for fall due to :    Impaired vision;Impaired mobility   Risk for fall due to: Comment    related to knees   Follow up Falls evaluation completed;Education provided;Falls prevention discussed Falls evaluation completed  Falls prevention discussed     MEDICARE RISK AT HOME: Medicare Risk at Home Any stairs in or around the home?: Yes If so, are there any without handrails?: No Home free of loose throw rugs in walkways, pet beds, electrical cords, etc?: Yes Adequate lighting in your home to reduce risk of falls?: Yes Life alert?: No Use of a cane, walker or w/c?: No Shower chair or bench in shower?: Yes Elevated toilet seat or a handicapped toilet?: Yes  TIMED UP AND GO:  Was the test performed?  No    Cognitive Function:        05/15/2023    3:45 PM 05/09/2022    3:56 PM 04/26/2021    9:11 AM  6CIT Screen  What Year? 0 points 0 points 0 points  What month? 0 points 0 points 0 points  What time? 0 points 0 points 0 points  Count back from 20 0 points 0 points 0 points  Months in reverse 0 points 0 points 0 points  Repeat phrase 0 points 0 points 0 points  Total Score 0 points 0 points 0 points    Immunizations Immunization History  Administered Date(s) Administered   DTaP 02/27/2007   Fluad Quad(high Dose 65+) 11/26/2018, 01/03/2021, 12/22/2021   Fluad Trivalent(High Dose 65+) 12/10/2022   Hepatitis B 02/27/2007   IPV 02/27/2007   Influenza Split 01/23/2011, 11/14/2012   Influenza, High Dose Seasonal PF 11/09/2015, 11/30/2016, 01/15/2018   Influenza,inj,Quad PF,6+ Mos 12/25/2013   Influenza-Unspecified 11/19/2014, 01/20/2020   PFIZER(Purple Top)SARS-COV-2 Vaccination 03/18/2019, 04/09/2019, 12/28/2019, 07/11/2020   Pfizer Covid-19 Vaccine Bivalent Booster 26yrs & up 01/03/2021   Pneumococcal Conjugate-13 11/14/2012   Pneumococcal Polysaccharide-23 04/14/2015   Td 01/20/2008,  02/05/2018   Tdap 08/19/2020   Zoster Recombinant(Shingrix) 12/26/2016, 04/18/2017   Zoster, Live 04/12/2009    TDAP status: Up to date  Flu Vaccine status: Up to date  Pneumococcal vaccine status: Up to date  Covid-19 vaccine status: Information provided on how to obtain vaccines.   Qualifies for Shingles Vaccine? No   Zostavax completed Yes   Shingrix Completed?: Yes  Screening Tests Health Maintenance  Topic Date Due   COVID-19 Vaccine (6 - 2024-25 season) 10/28/2022   HEMOGLOBIN A1C  06/10/2023   Diabetic kidney evaluation - Urine ACR  08/22/2023   Diabetic kidney evaluation - eGFR measurement  12/10/2023   FOOT EXAM  12/10/2023   OPHTHALMOLOGY EXAM  03/13/2024   Medicare Annual Wellness (AWV)  05/14/2024   Colonoscopy  03/13/2026   DTaP/Tdap/Td (5 - Td or Tdap) 08/20/2030   Pneumonia Vaccine 91+ Years old  Completed   INFLUENZA VACCINE  Completed   Hepatitis C Screening  Completed   Zoster Vaccines- Shingrix  Completed   HPV VACCINES  Aged Out    Health Maintenance  Health Maintenance Due  Topic Date Due   COVID-19 Vaccine (6 - 2024-25 season) 10/28/2022    Colorectal cancer screening: Type of screening: Colonoscopy. Completed 2023. Repeat every 5 years  Lung Cancer Screening: (Low Dose CT Chest recommended if Age 74-80 years, 20 pack-year currently smoking OR have quit w/in 15years.) does not qualify.   Lung Cancer Screening Referral:   Additional Screening:  Hepatitis C Screening: does not qualify; Completed 2018  Vision Screening: Recommended annual ophthalmology exams for early detection of glaucoma and other disorders of the eye. Is the patient up to date with their annual eye exam?  Yes  Who is the provider or what is the name of the office in which the patient attends annual eye exams? [facility] If pt is not established with a provider, would they like to be referred to a provider to establish care? No .   Dental Screening: Recommended annual dental  exams for proper oral hygiene  Nutrition Risk Assessment:  Has the patient had any N/V/D within the last 2 months?  No  Does the patient have any non-healing wounds?  No  Has the patient had any unintentional weight loss or weight gain?  No   Diabetes:  Is the patient diabetic?  Yes  If diabetic, was a CBG obtained today?  No  Did the patient bring in their glucometer from home?  No  How often do you monitor your CBG's? Does not check.   Financial Strains and Diabetes Management:  Are you having any financial strains with the device, your supplies or your medication? No .  Does the patient want to be seen by Chronic Care Management for management of their diabetes?  No  Would the patient like to be referred to a Nutritionist or for Diabetic Management?  No   Diabetic Exams:  Diabetic Eye Exam:  Completed. . Pt has been advised about the importance in completing this exam.   Diabetic Foot Exam: . Pt has been advised about the importance in completing this exam  Community Resource Referral / Chronic Care Management: CRR required this visit?  No   CCM required this visit?  No     Plan:     I have personally reviewed and noted the following in the patient's chart:   Medical and social history Use of alcohol, tobacco or illicit drugs  Current medications and supplements including opioid prescriptions. Patient is not currently taking opioid prescriptions. Functional ability and status Nutritional status Physical activity Advanced directives List of other physicians Hospitalizations, surgeries, and ER visits in previous 12 months Vitals Screenings to include cognitive, depression, and falls Referrals and appointments  In addition, I have reviewed and discussed with patient certain preventive protocols, quality metrics, and best practice recommendations. A written personalized care plan for preventive services as well as general preventive health recommendations were  provided to patient.     Remi Haggard, LPN   [id number]   After Visit Summary: (MyChart) Due to this being a telephonic visit, the after visit summary with patients personalized plan was offered to patient via MyChart   Nurse Notes:

## 2023-05-28 ENCOUNTER — Encounter: Payer: Self-pay | Admitting: Cardiology

## 2023-05-28 ENCOUNTER — Ambulatory Visit: Payer: Medicare PPO | Attending: Cardiology | Admitting: Cardiology

## 2023-05-28 ENCOUNTER — Ambulatory Visit (INDEPENDENT_AMBULATORY_CARE_PROVIDER_SITE_OTHER): Payer: Medicare PPO | Admitting: Cardiology

## 2023-05-28 VITALS — BP 116/70 | HR 62 | Ht 66.0 in | Wt 185.4 lb

## 2023-05-28 DIAGNOSIS — D6869 Other thrombophilia: Secondary | ICD-10-CM | POA: Diagnosis not present

## 2023-05-28 DIAGNOSIS — I10 Essential (primary) hypertension: Secondary | ICD-10-CM | POA: Diagnosis not present

## 2023-05-28 DIAGNOSIS — I480 Paroxysmal atrial fibrillation: Secondary | ICD-10-CM | POA: Diagnosis not present

## 2023-05-28 DIAGNOSIS — I2510 Atherosclerotic heart disease of native coronary artery without angina pectoris: Secondary | ICD-10-CM

## 2023-05-28 NOTE — Progress Notes (Signed)
  Electrophysiology Office Note:   Date:  05/28/2023  ID:  Michael Gula., DOB Jun 25, 1950, MRN [id number]  Primary Cardiologist: Donato Schultz, MD Primary Heart Failure: None Electrophysiologist: Klaira Pesci Jorja Loa, MD      History of Present Illness:   Michael Brownlow. is a 73 y.o. male with h/o coronary disease, hypertension, hyperlipidemia, atrial fibrillation seen today for routine electrophysiology followup.   Since last being seen in our clinic the patient reports doing well.  He is noted no further episodes of atrial fibrillation.  He is able to do all of his daily activities without restriction.  His main issue is arthritis in his knees.  Since being on Eliquis, he has had to reduce the dose of NSAIDs.  he denies chest pain, palpitations, dyspnea, PND, orthopnea, nausea, vomiting, dizziness, syncope, edema, weight gain, or early satiety.   Review of systems complete and found to be negative unless listed in HPI.   EP Information / Studies Reviewed:    EKG is not ordered today. EKG from 05/28/23 reviewed which showed sinus rhythm, PVCs        Risk Assessment/Calculations:    CHA2DS2-VASc Score = 3   This indicates a 3.2% annual risk of stroke. The patient's score is based upon: CHF History: 0 HTN History: 1 Diabetes History: 0 Stroke History: 0 Vascular Disease History: 1 Age Score: 1 Gender Score: 0            Physical Exam:   VS:  BP 116/70 (BP Location: Left Arm, Patient Position: Sitting, Cuff Size: Normal)   Pulse 62   Ht 5\' 6"  (1.676 m)   Wt 185 lb 6.4 oz (84.1 kg)   SpO2 97%   BMI 29.92 kg/m    Wt Readings from Last 3 Encounters:  05/28/23 185 lb 6.4 oz (84.1 kg)  05/28/23 185 lb 6.4 oz (84.1 kg)  12/10/22 192 lb (87.1 kg)     GEN: Well nourished, well developed in no acute distress NECK: No JVD; No carotid bruits CARDIAC: Regular rate and rhythm, no murmurs, rubs, gallops RESPIRATORY:  Clear to auscultation without rales, wheezing or rhonchi   ABDOMEN: Soft, non-tender, non-distended EXTREMITIES:  No edema; No deformity   ASSESSMENT AND PLAN:    1.  Paroxysmal atrial fibrillation: No episodes of atrial fibrillation.  He is happy with his control.  At this point, he does not want to make any adjustments.  If he determines that he wants rhythm control in the future, would be happy to see him back.  For now we Jaylina Ramdass see him back as needed.  2.  Coronary artery disease: Recent Myoview without ischemia.  Plan per primary cardiology.  3.  Hypertension: Currently well-controlled  4.  Secondary hypercoagulable state: Currently on Eliquis for atrial fibrillation  Follow up with Dr. Elberta Fortis  as needed   Signed, Rudolph Dobler Jorja Loa, MD

## 2023-05-28 NOTE — Progress Notes (Signed)
 Cardiology Office Note:  .   Date:  05/28/2023  ID:  [name]., DOB 1950/06/08, MRN [id number] PCP: Jeoffrey Massed, MD  [facility] Providers Cardiologist:  Donato Schultz, MD Electrophysiologist:  Will Jorja Loa, MD    History of Present Illness: .   Michael Hodge. is a 73 y.o. male Discussed the use of AI scribe software for clinical note transcription with the patient, who gave verbal consent to proceed.  History of Present Illness Michael Hodge. is a 73 year old male with paroxysmal atrial fibrillation who presents for follow-up.  He experiences palpitations but otherwise feels well with minimal fatigue or dyspnea during episodes. His last episode was in October 2023, and he has not experienced any since. He is currently taking Eliquis 5 mg twice a day and has metoprolol 25 mg as needed for a racing heart, which he has not used recently. No recent episodes of atrial fibrillation, significant fatigue, or dyspnea. He feels cold more quickly.  His coronary artery disease was initially evaluated with a coronary CT scan in 2012, showing a calcium score of 45 and mild nonobstructive coronary disease. A nuclear stress test in 2024 indicated low risk with no significant ischemia and an ejection fraction of 50%. He continues atorvastatin 40 mg daily with an LDL goal of less than 70.  He has a history of hypertension and is on hydrochlorothiazide 25 mg daily. His blood pressure management appears stable.  He also has hyperlipidemia, managed with atorvastatin, and diabetes, though specific management details for diabetes were not discussed.  He experienced significant bruising and bleeding after a minor injury, which he attributes to Eliquis. He also mentions arthritis, for which he occasionally considers NSAIDs but is cautious due to bleeding risks. He received a cortisone shot for knee pain, which improved his symptoms.  He enjoys quail hunting and is physically active,  walking in woods and fields. He wears contacts for monovision and has been doing so for 15-20 years.      Studies Reviewed: Marland Kitchen   EKG Interpretation Date/Time:  Tuesday May 28 2023 09:47:59 EDT Ventricular Rate:  65 PR Interval:  148 QRS Duration:  92 QT Interval:  418 QTC Calculation: 434 R Axis:   -8  Text Interpretation: Sinus rhythm with occasional Premature ventricular complexes No previous ECGs available Confirmed by Donato Schultz ([id number]) on 05/28/2023 10:04:11 AM    Results LABS Hb: 13.3 Cr: 0.9 LDL: 32  RADIOLOGY Coronary CT scan: calcium score 45, mild nonobstructive coronary disease (2012)  DIAGNOSTIC EKG: atrial fibrillation (11/2021) Echocardiogram: ejection fraction 60%, mildly dilated left atrium (12/2021) Nuclear stress test: low risk, no significant ischemia, ejection fraction 50% (2024) EKG: sinus rhythm, premature ventricular contractions (PVCs) (05/28/2023) Risk Assessment/Calculations:            Physical Exam:   VS:  BP 116/70   Pulse 62   Ht 5\' 6"  (1.676 m)   Wt 185 lb 6.4 oz (84.1 kg)   SpO2 97%   BMI 29.92 kg/m    Wt Readings from Last 3 Encounters:  05/28/23 185 lb 6.4 oz (84.1 kg)  12/10/22 192 lb (87.1 kg)  06/20/22 186 lb 9.6 oz (84.6 kg)    GEN: Well nourished, well developed in no acute distress NECK: No JVD; No carotid bruits CARDIAC: RRR, no murmurs, no rubs, no gallops RESPIRATORY:  Clear to auscultation without rales, wheezing or rhonchi  ABDOMEN: Soft, non-tender, non-distended EXTREMITIES:  No edema; No deformity  ASSESSMENT AND PLAN: .    Assessment and Plan Assessment & Plan Paroxysmal atrial fibrillation Paroxysmal atrial fibrillation with minimal symptoms, last episode in October 2023. Currently in sinus rhythm with occasional benign PVCs. CHADS-VASc score of 4 indicates moderate stroke risk. Continues on Eliquis for stroke prevention. Discussed risks and benefits of Eliquis, including small bleeding risk, evidenced by  recent bruising incidents. Inquired about discontinuing Eliquis, advised to continue due to risk of asymptomatic AFib episodes. Watchman device discussed as an alternative, noted as effective as Eliquis for stroke prevention, typically considered for those with bleeding issues or compliance difficulties. - Continue Eliquis 5 mg BID for stroke prevention. - Continue metoprolol 25 mg as needed for palpitations. - Consider Watchman device if issues with Eliquis arise.  Coronary artery disease Mild nonobstructive coronary artery disease with a calcium score of 45 from a 2012 coronary CT scan. 2024 nuclear stress test showed low risk with no significant ischemia and EF of 50%. On atorvastatin for hyperlipidemia with LDL goal <70, current LDL is 32, which is excellent. - Continue atorvastatin 40 mg daily with LDL goal <70.  Hypertension Hypertension managed with hydrochlorothiazide. - Continue hydrochlorothiazide 25 mg daily.  Osteoarthritis of both knees Osteoarthritis in both knees, one knee needing replacement. Received cortisone injection on April 12, 2023, which improved symptoms. Advised against regular NSAID use due to increased bleeding risk with Eliquis but can use them for short periods if necessary. - Use NSAIDs like ibuprofen or Aleve for short periods if needed, ensuring food intake to minimize bleeding risk.         1 yr  Signed, Donato Schultz, MD

## 2023-05-28 NOTE — Patient Instructions (Signed)

## 2023-06-08 ENCOUNTER — Other Ambulatory Visit: Payer: Self-pay | Admitting: Family Medicine

## 2023-06-09 NOTE — Progress Notes (Unsigned)
 Office Note 06/10/2023  CC:  Chief Complaint  Patient presents with   Annual Exam    Pt is fasting   Patient is a 73 y.o. male who is here for annual health maintenance exam and 39-month follow-up diabetes, hypertension, and hypercholesterolemia. A/P as of last visit: "#1 diabetes without complication.  Good control. Feet exam essentially normal today. POC Hba1c 6.4%, improved from 6.8% 4 mo ago and from 7.5% 7 mo ago. Continue metformin XR 500 mg, 2 q. Supper.   #2 hypertension, well-controlled on HCTZ 25 mg a day. Monitor electrolytes and creatinine today.   3.  Hypercholesterolemia, doing well on Lipitor 40 mg a day. LDL was 33 about 6 months ago. Lipid panel and hepatic panel today.   4.  PAF. Doing well on Eliquis.  He has metoprolol to use as needed but is only had to use it a couple of times in the last 10 months. He has seen electrophysiology but he has decided against any kind of ablation at this time.   #5 gross hematuria. We did treat him for prostatitis a couple of times.  He has seen urology now and he reports that a UA there showed no blood.  He is set to get a CT for further evaluation of this today.  Cystoscopy also planned."  INTERIM HX: Abrham feels well.  A couple of months ago he got a steroid injection in his left knee by his orthopedist and it helped very well.  His hematuria workup showed no worrisome abnormality.  He had some friable prostatic urethra.  He was reassured by urology.   Past Medical History:  Diagnosis Date   Atrial fibrillation (HCC)    11/2021   CAD (coronary artery disease)    CT demonstrated nonobstructive plaques (this test was prompted by borderline ETT.   Diabetes mellitus without complication (HCC) dx'd 08/2011   Fasting gluc 136 and HbA1c 6.7% at diagnosis.    Diverticulosis    GERD (gastroesophageal reflux disease)    occ   Hepatic steatosis 07/2020   noted on CT abd/pelv done for GE illness   History of colitis  08/06/2020   presumed infectious. CT abd/pelv w/contrast at [facility]->long segment circumferential wall thickening of descending colon   History of nonmelanoma skin cancer    BCC   Hx of adenomatous colonic polyps    Hyperlipidemia    Hypertension    w/hypertensive retinopathy   Internal hemorrhoids    Obesity, Class II, BMI 35-39.9    Osteoarthritis of both knees    mod to severe 2022, steroid injections by ortho helped some.  Visco injections on the right --no significant help.   UTI (urinary tract infection) due to urinary indwelling catheter (HCC)    05/2022 (e coli and enterococcus)    Past Surgical History:  Procedure Laterality Date   CARDIOVASCULAR STRESS TEST  02/2013   2015 LexiScan/low-level exercise-Myoview (02/2013): No ischemia, EF 53%, occ symptomatic PVCs.  03/2022 NORMAL/LOW RISK   COLONOSCOPY  02/2021   2023 normal.  Recall 5 yrs   COLONOSCOPY W/ POLYPECTOMY  2005;08/21/10;11/29/15   adenomatous; also mild diverticulosis; recall 2017.  iFOB neg 09/2011.  11/2015: tubular adenoma x 1. 02/2021 normal->recall 29yrs   HAMMER TOE SURGERY Right 05/24/2020   MOHS SURGERY  2018   head, and 2021 on Lt cheek   remvoal sebaceous cyst  15-20 years ago   TONSILLECTOMY     as a child   TOTAL KNEE ARTHROPLASTY Right 05/29/2022  Procedure: TOTAL KNEE ARTHROPLASTY;  Surgeon: Saundra Curl, MD;  Location: [facility];  Service: Orthopedics;  Laterality: Right;   TRANSTHORACIC ECHOCARDIOGRAM     12/2021 EF 55-60%, grd I DD, mild LAE    Family History  Problem Relation Age of Onset   Heart attack Mother    Diabetes Father    Stroke Father    Stroke Other    Colon cancer Neg Hx    Colon polyps Neg Hx    Rectal cancer Neg Hx    Stomach cancer Neg Hx    Esophageal cancer Neg Hx     Social History   Socioeconomic History   Marital status: Married    Spouse name: Not on file   Number of children: Not on file   Years of education: Not on file   Highest education level:  Bachelor's degree (e.g., BA, AB, BS)  Occupational History   Not on file  Tobacco Use   Smoking status: Never   Smokeless tobacco: Never  Vaping Use   Vaping status: Never Used  Substance and Sexual Activity   Alcohol use: Yes    Alcohol/week: 3.0 standard drinks of alcohol    Types: 3 Shots of liquor per week    Comment: occ   Drug use: No   Sexual activity: Yes  Other Topics Concern   Not on file  Social History Narrative   Married x 40 yrs.   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   No T/A/Ds.   Social Drivers of Corporate investment banker Strain: Low Risk  (06/09/2023)   Overall Financial Resource Strain (CARDIA)    Difficulty of Paying Living Expenses: Not hard at all  Food Insecurity: No Food Insecurity (06/09/2023)   Hunger Vital Sign    Worried About Running Out of Food in the Last Year: Never true    Ran Out of Food in the Last Year: Never true  Transportation Needs: No Transportation Needs (06/09/2023)   PRAPARE - Administrator, Civil Service (Medical): No    Lack of Transportation (Non-Medical): No  Physical Activity: Insufficiently Active (06/09/2023)   Exercise Vital Sign    Days of Exercise per Week: 4 days    Minutes of Exercise per Session: 30 min  Stress: No Stress Concern Present (06/09/2023)   [employer] of Occupational Health - Occupational Stress Questionnaire    Feeling of Stress : Not at all  Social Connections: Socially Integrated (06/09/2023)   Social Connection and Isolation Panel [NHANES]    Frequency of Communication with Friends and Family: More than three times a week    Frequency of Social Gatherings with Friends and Family: More than three times a week    Attends Religious Services: More than 4 times per year    Active Member of [employer] or Organizations: Yes    Attends Engineer, structural: More than 4 times per year    Marital Status: Married  Catering manager Violence: Not At Risk  (05/15/2023)   Humiliation, Afraid, Rape, and Kick questionnaire    Fear of Current or Ex-Partner: No    Emotionally Abused: No    Physically Abused: No    Sexually Abused: No    Outpatient Medications Prior to Visit  Medication Sig Dispense Refill   ELIQUIS 5 MG TABS tablet TAKE 1 TABLET BY MOUTH 2 TIMES A DAY 60 tablet 5   metoprolol tartrate (LOPRESSOR) 25 MG tablet Take 1 tablet (  25 mg total) by mouth as needed (racing heart beat). Take as needed for racing heart beat 60 tablet 1   atorvastatin (LIPITOR) 40 MG tablet Take 1 tablet (40 mg total) by mouth daily. 90 tablet 1   diclofenac Sodium (VOLTAREN) 1 % GEL Apply 4 g topically daily as needed (pain). 100 g 0   glucose blood (FREESTYLE LITE) test strip USE TO CHECK BLOOD SUGAR 1-2 TIMES DAILY AS INSTRUCTED. 100 each 6   hydrochlorothiazide (HYDRODIURIL) 25 MG tablet Take 1 tablet (25 mg total) by mouth daily. 90 tablet 1   metFORMIN (GLUCOPHAGE-XR) 500 MG 24 hr tablet TAKE 2 TABLETS BY MOUTH DAILY WITH DINNER 60 tablet 1   potassium chloride SA (KLOR-CON M20) 20 MEQ tablet Take 2 tablets (40 mEq total) by mouth 2 (two) times daily. 360 tablet 1   sildenafil (VIAGRA) 100 MG tablet 1/2-1 tab po qd as needed.  Take 30-60 min prior to intercourse 15 tablet 6   No facility-administered medications prior to visit.    No Known Allergies  Review of Systems  Constitutional:  Negative for appetite change, chills, fatigue and fever.  HENT:  Negative for congestion, dental problem, ear pain and sore throat.   Eyes:  Negative for discharge, redness and visual disturbance.  Respiratory:  Negative for cough, chest tightness, shortness of breath and wheezing.   Cardiovascular:  Negative for chest pain, palpitations and leg swelling.  Gastrointestinal:  Negative for abdominal pain, blood in stool, diarrhea, nausea and vomiting.  Genitourinary:  Negative for difficulty urinating, dysuria, flank pain, frequency, hematuria and urgency.   Musculoskeletal:  Negative for arthralgias, back pain, joint swelling, myalgias and neck stiffness.  Skin:  Negative for pallor and rash.  Neurological:  Negative for dizziness, speech difficulty, weakness and headaches.  Hematological:  Negative for adenopathy. Does not bruise/bleed easily.  Psychiatric/Behavioral:  Negative for confusion and sleep disturbance. The patient is not nervous/anxious.     PE;    06/10/2023    8:37 AM 05/28/2023   10:31 AM 05/28/2023    9:41 AM  Vitals with BMI  Height 5' 5.5" 5\' 6"  5\' 6"   Weight 189 lbs 185 lbs 6 oz 185 lbs 6 oz  BMI 30.96 29.94 29.94  Systolic 133 116 161  Diastolic 57 70 70  Pulse 50 62 62     Gen: Alert, well appearing.  Patient is oriented to person, place, time, and situation. AFFECT: pleasant, lucid thought and speech. ENT: Ears: EACs clear, normal epithelium.  TMs with good light reflex and landmarks bilaterally.  Eyes: no injection, icteris, swelling, or exudate.  EOMI, PERRLA. Nose: no drainage or turbinate edema/swelling.  No injection or focal lesion.  Mouth: lips without lesion/swelling.  Oral mucosa pink and moist.  Dentition intact and without obvious caries or gingival swelling.  Oropharynx without erythema, exudate, or swelling.  Neck: supple/nontender.  No LAD, mass, or TM.  Carotid pulses 2+ bilaterally, without bruits. CV: RRR, no m/r/g.   LUNGS: CTA bilat, nonlabored resps, good aeration in all lung fields. ABD: soft, NT, ND, BS normal.  No hepatospenomegaly or mass.  No bruits. EXT: no clubbing, cyanosis, or edema.  Musculoskeletal: no joint swelling, erythema, warmth, or tenderness.  ROM of all joints intact. Skin - no sores or suspicious lesions or rashes or color changes  Pertinent labs:  Lab Results  Component Value Date   TSH 3.35 12/22/2021   Lab Results  Component Value Date   WBC 7.1 05/17/2022   HGB  13.0 05/17/2022   HCT 40.5 05/17/2022   MCV 91.6 05/17/2022   PLT 184 05/17/2022   Lab Results   Component Value Date   CREATININE 0.95 12/10/2022   BUN 22 12/10/2022   NA 138 12/10/2022   K 3.4 (L) 12/10/2022   CL 101 12/10/2022   CO2 28 12/10/2022   Lab Results  Component Value Date   ALT 13 12/10/2022   AST 16 12/10/2022   ALKPHOS 65 12/10/2022   BILITOT 1.0 12/10/2022   Lab Results  Component Value Date   CHOL 88 12/10/2022   Lab Results  Component Value Date   HDL 38.60 (L) 12/10/2022   Lab Results  Component Value Date   LDLCALC 32 12/10/2022   Lab Results  Component Value Date   TRIG 87.0 12/10/2022   Lab Results  Component Value Date   CHOLHDL 2 12/10/2022   Lab Results  Component Value Date   PSA 0.80 05/24/2022   PSA 0.68 06/22/2021   PSA 0.89 05/31/2020   Lab Results  Component Value Date   HGBA1C 6.5 (A) 06/10/2023   HGBA1C 6.5 06/10/2023   HGBA1C 6.5 (A) 06/10/2023   HGBA1C 6.5 06/10/2023   ASSESSMENT AND PLAN:   #1 health maintenance exam: Reviewed age and gender appropriate health maintenance issues (prudent diet, regular exercise, health risks of tobacco and excessive alcohol, use of seatbelts, fire alarms in home, use of sunscreen).  Also reviewed age and gender appropriate health screening as well as vaccine recommendations. Vaccines: ALL UTD. Labs:  HP+ PSA Prostate ca screening: PSA today Colon ca screening: Recall 2028. AAA screening: does not meet criteria. Lung ca screening: does not meet criteria. Hep C screen NEG 2018.  #2 diabetes without complication. Great control. Point-of-care hemoglobin A1c today is 6.5%. Continue Glucophage XR 1000 mg daily.  #3 hypertension, well-controlled on HCTZ 25 mg a day. He takes 40 mill equivalents of Klor-Con twice a day. Check basic metabolic panel today.  4.  Hypercholesterolemia, doing well on a atorvastatin 40 mg a day. Lipid panel and hepatic panel today.  #5 PAF. He does not feel any episodes of A-fib. He will continue on Eliquis 5 mg twice daily.  He has metoprolol to use  for as needed symptomatic A-fib. Check CBC today.  An After Visit Summary was printed and given to the patient.  FOLLOW UP:  Return in about 6 months (around 12/10/2023) for routine chronic illness f/u.  Signed:  Arletha Lady, MD           06/10/2023

## 2023-06-10 ENCOUNTER — Ambulatory Visit (INDEPENDENT_AMBULATORY_CARE_PROVIDER_SITE_OTHER): Payer: Medicare PPO | Admitting: Family Medicine

## 2023-06-10 ENCOUNTER — Encounter: Payer: Self-pay | Admitting: Family Medicine

## 2023-06-10 VITALS — BP 133/57 | HR 50 | Temp 97.6°F | Ht 65.5 in | Wt 189.0 lb

## 2023-06-10 DIAGNOSIS — E7800 Pure hypercholesterolemia, unspecified: Secondary | ICD-10-CM | POA: Diagnosis not present

## 2023-06-10 DIAGNOSIS — I10 Essential (primary) hypertension: Secondary | ICD-10-CM

## 2023-06-10 DIAGNOSIS — Z0000 Encounter for general adult medical examination without abnormal findings: Secondary | ICD-10-CM

## 2023-06-10 DIAGNOSIS — Z125 Encounter for screening for malignant neoplasm of prostate: Secondary | ICD-10-CM

## 2023-06-10 DIAGNOSIS — E119 Type 2 diabetes mellitus without complications: Secondary | ICD-10-CM | POA: Diagnosis not present

## 2023-06-10 DIAGNOSIS — Z7984 Long term (current) use of oral hypoglycemic drugs: Secondary | ICD-10-CM

## 2023-06-10 LAB — LIPID PANEL
Cholesterol: 84 mg/dL (ref 0–200)
HDL: 40.6 mg/dL (ref >39.00)
LDL Cholesterol: 28 mg/dL (ref 0–99)
NonHDL: 43.86
Total CHOL/HDL Ratio: 2
Triglycerides: 81 mg/dL (ref 0.0–149.0)
VLDL: 16.2 mg/dL (ref 0.0–40.0)

## 2023-06-10 LAB — CBC WITH DIFFERENTIAL/PLATELET
Basophils Absolute: 0 10*3/uL (ref 0.0–0.1)
Basophils Relative: 0.9 % (ref 0.0–3.0)
Eosinophils Absolute: 0.1 10*3/uL (ref 0.0–0.7)
Eosinophils Relative: 1.4 % (ref 0.0–5.0)
HCT: 41 % (ref 39.0–52.0)
Hemoglobin: 13.8 g/dL (ref 13.0–17.0)
Lymphocytes Relative: 26.4 % (ref 12.0–46.0)
Lymphs Abs: 1.4 10*3/uL (ref 0.7–4.0)
MCHC: 33.7 g/dL (ref 30.0–36.0)
MCV: 90.3 fl (ref 78.0–100.0)
Monocytes Absolute: 0.7 10*3/uL (ref 0.1–1.0)
Monocytes Relative: 12.6 % — ABNORMAL HIGH (ref 3.0–12.0)
Neutro Abs: 3.2 10*3/uL (ref 1.4–7.7)
Neutrophils Relative %: 58.7 % (ref 43.0–77.0)
Platelets: 177 10*3/uL (ref 150.0–400.0)
RBC: 4.53 Mil/uL (ref 4.22–5.81)
RDW: 14.5 % (ref 11.5–15.5)
WBC: 5.4 10*3/uL (ref 4.0–10.5)

## 2023-06-10 LAB — POCT GLYCOSYLATED HEMOGLOBIN (HGB A1C)
HbA1c POC (<> result, manual entry): 6.5 % (ref 4.0–5.6)
HbA1c, POC (controlled diabetic range): 6.5 % (ref 0.0–7.0)
HbA1c, POC (prediabetic range): 6.5 % — AB (ref 5.7–6.4)
Hemoglobin A1C: 6.5 % — AB (ref 4.0–5.6)

## 2023-06-10 LAB — COMPREHENSIVE METABOLIC PANEL WITH GFR
ALT: 17 U/L (ref 0–53)
AST: 21 U/L (ref 0–37)
Albumin: 4.3 g/dL (ref 3.5–5.2)
Alkaline Phosphatase: 67 U/L (ref 39–117)
BUN: 25 mg/dL — ABNORMAL HIGH (ref 6–23)
CO2: 28 meq/L (ref 19–32)
Calcium: 9.6 mg/dL (ref 8.4–10.5)
Chloride: 102 meq/L (ref 96–112)
Creatinine, Ser: 0.95 mg/dL (ref 0.40–1.50)
GFR: 78.87 mL/min (ref >60.00)
Glucose, Bld: 111 mg/dL — ABNORMAL HIGH (ref 70–99)
Potassium: 3.5 meq/L (ref 3.5–5.1)
Sodium: 139 meq/L (ref 135–145)
Total Bilirubin: 1 mg/dL (ref 0.2–1.2)
Total Protein: 6.4 g/dL (ref 6.0–8.3)

## 2023-06-10 LAB — PSA, MEDICARE: PSA: 1.15 ng/mL (ref 0.10–4.00)

## 2023-06-10 MED ORDER — DICLOFENAC SODIUM 1 % EX GEL: 4.0000 g | Freq: Every day | CUTANEOUS | 1 refills | Status: DC | PRN

## 2023-06-10 MED ORDER — FREESTYLE LITE TEST VI STRP: ORAL_STRIP | 6 refills | Status: AC

## 2023-06-10 MED ORDER — HYDROCHLOROTHIAZIDE 25 MG PO TABS: 25.0000 mg | ORAL_TABLET | Freq: Every day | ORAL | 3 refills | Status: AC

## 2023-06-10 MED ORDER — METFORMIN HCL ER 500 MG PO TB24: ORAL_TABLET | ORAL | 3 refills | Status: DC

## 2023-06-10 MED ORDER — SILDENAFIL CITRATE 100 MG PO TABS: ORAL_TABLET | ORAL | 6 refills | Status: AC

## 2023-06-10 MED ORDER — POTASSIUM CHLORIDE CRYS ER 20 MEQ PO TBCR: 40.0000 meq | EXTENDED_RELEASE_TABLET | Freq: Two times a day (BID) | ORAL | 3 refills | Status: AC

## 2023-06-10 MED ORDER — ATORVASTATIN CALCIUM 40 MG PO TABS: 40.0000 mg | ORAL_TABLET | Freq: Every day | ORAL | 3 refills | Status: AC

## 2023-06-11 ENCOUNTER — Encounter: Payer: Self-pay | Admitting: Family Medicine

## 2023-06-13 DIAGNOSIS — L821 Other seborrheic keratosis: Secondary | ICD-10-CM | POA: Diagnosis not present

## 2023-06-13 DIAGNOSIS — L720 Epidermal cyst: Secondary | ICD-10-CM | POA: Diagnosis not present

## 2023-06-13 DIAGNOSIS — L570 Actinic keratosis: Secondary | ICD-10-CM | POA: Diagnosis not present

## 2023-06-13 DIAGNOSIS — D225 Melanocytic nevi of trunk: Secondary | ICD-10-CM | POA: Diagnosis not present

## 2023-06-13 DIAGNOSIS — Z85828 Personal history of other malignant neoplasm of skin: Secondary | ICD-10-CM | POA: Diagnosis not present

## 2023-07-15 DIAGNOSIS — M1712 Unilateral primary osteoarthritis, left knee: Secondary | ICD-10-CM | POA: Diagnosis not present

## 2023-08-23 ENCOUNTER — Other Ambulatory Visit: Payer: Self-pay

## 2023-08-23 ENCOUNTER — Other Ambulatory Visit: Payer: Self-pay | Admitting: Cardiology

## 2023-08-23 ENCOUNTER — Telehealth: Payer: Self-pay | Admitting: Cardiology

## 2023-08-23 DIAGNOSIS — I480 Paroxysmal atrial fibrillation: Secondary | ICD-10-CM

## 2023-08-23 MED ORDER — APIXABAN 5 MG PO TABS: 5.0000 mg | ORAL_TABLET | Freq: Two times a day (BID) | ORAL | 1 refills | Status: DC

## 2023-08-23 NOTE — Telephone Encounter (Signed)
 Prescription refill request for Eliquis  received. Indication: Afib  Last office visit: 05/28/23 ([place])  Scr: 0.95 (06/10/23)  Age: 73 Weight: 85.7kg  Appropriate dose. Refill sent.

## 2023-08-23 NOTE — Telephone Encounter (Signed)
 Prescription refill request for Eliquis  received. Indication:afib Last office visit:4/25 Scr:0.95  4/25 Age: 73 Weight:85.7  kg  Prescription refilled

## 2023-08-23 NOTE — Telephone Encounter (Signed)
*  STAT* If patient is at the pharmacy, call can be transferred to refill team.   1. Which medications need to be refilled? (please list name of each medication and dose if known)   ELIQUIS  5 MG TABS tablet   2. Would you like to learn more about the convenience, safety, & potential cost savings by using the [facility]?   3. Are you open to using the [facility] (Type [facility]. ).  4. Which pharmacy/location (including street and city if local pharmacy) is medication to be sent to?  [facility] [id number] - [place], [place] - [address]   5. Do they need a 30 day or 90 day supply?  90 day  Patient stated he still has some medication but will be going out of town.

## 2023-09-09 DIAGNOSIS — L738 Other specified follicular disorders: Secondary | ICD-10-CM | POA: Diagnosis not present

## 2023-09-09 DIAGNOSIS — L0889 Other specified local infections of the skin and subcutaneous tissue: Secondary | ICD-10-CM | POA: Diagnosis not present

## 2023-09-09 DIAGNOSIS — L720 Epidermal cyst: Secondary | ICD-10-CM | POA: Diagnosis not present

## 2023-09-09 DIAGNOSIS — Z85828 Personal history of other malignant neoplasm of skin: Secondary | ICD-10-CM | POA: Diagnosis not present

## 2023-09-17 DIAGNOSIS — L0109 Other impetigo: Secondary | ICD-10-CM | POA: Diagnosis not present

## 2023-09-17 DIAGNOSIS — L300 Nummular dermatitis: Secondary | ICD-10-CM | POA: Diagnosis not present

## 2023-09-17 DIAGNOSIS — L308 Other specified dermatitis: Secondary | ICD-10-CM | POA: Diagnosis not present

## 2023-09-17 DIAGNOSIS — L0889 Other specified local infections of the skin and subcutaneous tissue: Secondary | ICD-10-CM | POA: Diagnosis not present

## 2023-09-17 DIAGNOSIS — Z85828 Personal history of other malignant neoplasm of skin: Secondary | ICD-10-CM | POA: Diagnosis not present

## 2023-10-21 DIAGNOSIS — M1712 Unilateral primary osteoarthritis, left knee: Secondary | ICD-10-CM | POA: Diagnosis not present

## 2023-11-05 DIAGNOSIS — L738 Other specified follicular disorders: Secondary | ICD-10-CM | POA: Diagnosis not present

## 2023-11-05 DIAGNOSIS — Z85828 Personal history of other malignant neoplasm of skin: Secondary | ICD-10-CM | POA: Diagnosis not present

## 2023-11-05 DIAGNOSIS — R21 Rash and other nonspecific skin eruption: Secondary | ICD-10-CM | POA: Diagnosis not present

## 2023-11-05 DIAGNOSIS — D692 Other nonthrombocytopenic purpura: Secondary | ICD-10-CM | POA: Diagnosis not present

## 2023-11-20 NOTE — Progress Notes (Signed)
 Michael Hodge [name]                                          MRN: [id number]   11/20/2023   The VBCI Quality Team Specialist reviewed this patient medical record for the purposes of chart review for care gap closure. The following were reviewed: chart review for care gap closure-kidney health evaluation for diabetes:eGFR  and uACR.    VBCI Quality Team

## 2023-12-10 ENCOUNTER — Ambulatory Visit: Admitting: Family Medicine

## 2023-12-18 ENCOUNTER — Telehealth: Admitting: Family Medicine

## 2023-12-18 DIAGNOSIS — R0981 Nasal congestion: Secondary | ICD-10-CM

## 2023-12-18 DIAGNOSIS — J208 Acute bronchitis due to other specified organisms: Secondary | ICD-10-CM | POA: Diagnosis not present

## 2023-12-18 MED ORDER — PREDNISONE 20 MG PO TABS: 20.0000 mg | ORAL_TABLET | Freq: Every day | ORAL | 0 refills | Status: AC

## 2023-12-18 NOTE — Patient Instructions (Addendum)
 Michael Hodge Michael Hodge., thank you for joining Michael CHRISTELLA Barefoot, NP for today's virtual visit.  While this provider is not your primary care provider (PCP), if your PCP is located in our provider database this encounter information will be shared with them immediately following your visit.   Michael Hodge account gives you access to today's visit and all your visits, tests, and labs performed at [facility]  click here if you don't have Michael Michael Hodge account or go to [email or web address]  Consent: (Patient) Michael Hodge Michael Hodge. provided verbal consent for this virtual visit at the beginning of the encounter.  Current Medications:  Current Outpatient Medications:    predniSONE  (DELTASONE ) 20 MG tablet, Take 1 tablet (20 mg total) by mouth daily with breakfast for 5 days., Disp: 5 tablet, Rfl: 0   apixaban  (ELIQUIS ) 5 MG TABS tablet, Take 1 tablet (5 mg total) by mouth 2 (two) times daily., Disp: 180 tablet, Rfl: 1   atorvastatin  (LIPITOR) 40 MG tablet, Take 1 tablet (40 mg total) by mouth daily., Disp: 90 tablet, Rfl: 3   diclofenac  Sodium (VOLTAREN ) 1 % GEL, Apply 4 g topically daily as needed (pain)., Disp: 100 g, Rfl: 1   glucose blood (FREESTYLE LITE) test strip, USE TO CHECK BLOOD SUGAR 1-2 TIMES DAILY AS INSTRUCTED., Disp: 100 each, Rfl: 6   hydrochlorothiazide  (HYDRODIURIL ) 25 MG tablet, Take 1 tablet (25 mg total) by mouth daily., Disp: 90 tablet, Rfl: 3   metFORMIN  (GLUCOPHAGE -XR) 500 MG 24 hr tablet, TAKE 2 TABLETS BY MOUTH DAILY WITH DINNER, Disp: 180 tablet, Rfl: 3   metoprolol  tartrate (LOPRESSOR ) 25 MG tablet, Take 1 tablet (25 mg total) by mouth as needed (racing heart beat). Take as needed for racing heart beat, Disp: 60 tablet, Rfl: 1   potassium chloride  SA (KLOR-CON  M20) 20 MEQ tablet, Take 2 tablets (40 mEq total) by mouth 2 (two) times daily., Disp: 360 tablet, Rfl: 3   sildenafil  (VIAGRA ) 100 MG tablet, 1/2-1 tab po qd as needed.  Take 30-60 min prior  to intercourse, Disp: 15 tablet, Rfl: 6   Medications ordered in this encounter:  Meds ordered this encounter  Medications   predniSONE  (DELTASONE ) 20 MG tablet    Sig: Take 1 tablet (20 mg total) by mouth daily with breakfast for 5 days.    Dispense:  5 tablet    Refill:  0    Supervising Provider:   BLAISE ALEENE Hodge [[id number]]     *If you need refills on other medications prior to your next appointment, please contact your pharmacy*  Follow-Up: Call back or seek an in-person evaluation if the symptoms worsen or if the condition fails to improve as anticipated.  [facility] [phone]  Other Instructions  Advised to use Claritin daily to help with possible allergy drainage   He continues to have cough- but only productive with spasms, given this I advise to stop the mucinex. It might be making it worse. Start allergy medication and use prednisone  to calm airway. No red flags- of fevers, sputum color, CP or SHOB or wheezing Advised strict in person should that change.    If you have been instructed to have an in-person evaluation today at Michael local Urgent Care facility, please use the link below. It will take you to Michael list of all of our available [facility], including address, phone number and hours of operation. Please do not delay care.  [facility]  If you or Michael family member do not have Michael primary care provider, use the link below to schedule Michael visit and establish care. When you choose Michael [employer] primary care physician or advanced practice provider, you gain Michael long-term partner in health. Find Michael Primary Care Provider  Learn more about [facility] in-office and virtual care options: [facility] - Get Care Now

## 2023-12-18 NOTE — Progress Notes (Signed)
 Virtual Visit Consent   Michael S Karwowski Jr., you are scheduled for a virtual visit with a [facility] provider today. Just as with appointments in the office, your consent must be obtained to participate. Your consent will be active for this visit and any virtual visit you may have with one of our providers in the next 365 days. If you have a MyChart account, a copy of this consent can be sent to you electronically.  As this is a virtual visit, video technology does not allow for your provider to perform a traditional examination. This may limit your provider's ability to fully assess your condition. If your provider identifies any concerns that need to be evaluated in person or the need to arrange testing (such as labs, EKG, etc.), we will make arrangements to do so. Although advances in technology are sophisticated, we cannot ensure that it will always work on either your end or our end. If the connection with a video visit is poor, the visit may have to be switched to a telephone visit. With either a video or telephone visit, we are not always able to ensure that we have a secure connection.  By engaging in this virtual visit, you consent to the provision of healthcare and authorize for your insurance to be billed (if applicable) for the services provided during this visit. Depending on your insurance coverage, you may receive a charge related to this service.  I need to obtain your verbal consent now. Are you willing to proceed with your visit today? Michael Hodge. has provided verbal consent on 12/18/2023 for a virtual visit (video or telephone). Chiquita CHRISTELLA Barefoot, NP  Date: 12/18/2023 9:48 AM   Virtual Visit via Video Note   I, Chiquita CHRISTELLA Barefoot, connected with  Michael Hodge.  ([id number], [id number]) on 12/18/23 at  9:45 AM EDT by a video-enabled telemedicine application and verified that I am speaking with the correct person using two identifiers.  Location: Patient: Virtual Visit Location  Patient: Home Provider: Virtual Visit Location Provider: Home Office   I discussed the limitations of evaluation and management by telemedicine and the availability of in person appointments. The patient expressed understanding and agreed to proceed.    History of Present Illness: Michael Perz. is a 73 y.o. who identifies as a male who was assigned male at birth, and is being seen today for cough that is persistent  Onset- started last week. Over the weekend was feeling poorly, but overall feeling better, but the cough is persistent. Cough is productive- color is clear- some whitish at time- usually only when coughing with spasms.   Associated symptoms are none Modifying factors are mucinex DM Denies chest pain, shortness of breath, fevers, chills  Exposure to sick contacts- wife has symptoms of cough  COVID test: none  Vaccines: none  Problems:  Patient Active Problem List   Diagnosis Date Noted   Welcome to Medicare preventive visit 04/14/2015   Diabetes mellitus without complication (HCC) 03/20/2014   Sciatica 07/04/2013   Hypokalemia 07/04/2013   Type II or unspecified type diabetes mellitus without mention of complication, not stated as uncontrolled 10/11/2011   Health maintenance examination 09/17/2011   CAD (coronary artery disease) 09/08/2010   HTN (hypertension) 09/08/2010   Dyslipidemia 09/08/2010   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   Arthropathy 01/20/2008   Allergic rhinitis 10/30/2006    Allergies: No Known Allergies Medications:  Current Outpatient Medications:    apixaban  (ELIQUIS ) 5 MG TABS tablet,  Take 1 tablet (5 mg total) by mouth 2 (two) times daily., Disp: 180 tablet, Rfl: 1   atorvastatin  (LIPITOR) 40 MG tablet, Take 1 tablet (40 mg total) by mouth daily., Disp: 90 tablet, Rfl: 3   diclofenac  Sodium (VOLTAREN ) 1 % GEL, Apply 4 g topically daily as needed (pain)., Disp: 100 g, Rfl: 1   glucose blood (FREESTYLE LITE) test strip, USE TO CHECK BLOOD SUGAR 1-2  TIMES DAILY AS INSTRUCTED., Disp: 100 each, Rfl: 6   hydrochlorothiazide  (HYDRODIURIL ) 25 MG tablet, Take 1 tablet (25 mg total) by mouth daily., Disp: 90 tablet, Rfl: 3   metFORMIN  (GLUCOPHAGE -XR) 500 MG 24 hr tablet, TAKE 2 TABLETS BY MOUTH DAILY WITH DINNER, Disp: 180 tablet, Rfl: 3   metoprolol  tartrate (LOPRESSOR ) 25 MG tablet, Take 1 tablet (25 mg total) by mouth as needed (racing heart beat). Take as needed for racing heart beat, Disp: 60 tablet, Rfl: 1   potassium chloride  SA (KLOR-CON  M20) 20 MEQ tablet, Take 2 tablets (40 mEq total) by mouth 2 (two) times daily., Disp: 360 tablet, Rfl: 3   sildenafil  (VIAGRA ) 100 MG tablet, 1/2-1 tab po qd as needed.  Take 30-60 min prior to intercourse, Disp: 15 tablet, Rfl: 6  Observations/Objective: Patient is well-developed, well-nourished in no acute distress.  Resting comfortably  at home.  Head is normocephalic, atraumatic.  No labored breathing.  Speech is clear and coherent with logical content.  Patient is alert and oriented at baseline.    Assessment and Plan:   1. Viral bronchitis (Primary)  - predniSONE  (DELTASONE ) 20 MG tablet; Take 1 tablet (20 mg total) by mouth daily with breakfast for 5 days.  Dispense: 5 tablet; Refill: 0  2. Nasal congestion  Advised to use Claritin daily to help with possible allergy drainage   He continues to have cough- but only productive with spasms, given this I advise to stop the mucinex. It might be making it worse. Start allergy medication and use prednisone  to calm airway. No red flags- of fevers, sputum color, CP or SHOB or wheezing Advised strict in person should that change.  Reviewed side effects, risks and benefits of medication.    Patient acknowledged agreement and understanding of the plan.   Past Medical, Surgical, Social History, Allergies, and Medications have been Reviewed.     Follow Up Instructions: I discussed the assessment and treatment plan with the patient. The  patient was provided an opportunity to ask questions and all were answered. The patient agreed with the plan and demonstrated an understanding of the instructions.  A copy of instructions were sent to the patient via MyChart unless otherwise noted below.    The patient was advised to call back or seek an in-person evaluation if the symptoms worsen or if the condition fails to improve as anticipated.    Chiquita CHRISTELLA Barefoot, NP

## 2023-12-25 DIAGNOSIS — Z85828 Personal history of other malignant neoplasm of skin: Secondary | ICD-10-CM | POA: Diagnosis not present

## 2023-12-25 DIAGNOSIS — L570 Actinic keratosis: Secondary | ICD-10-CM | POA: Diagnosis not present

## 2023-12-25 DIAGNOSIS — L738 Other specified follicular disorders: Secondary | ICD-10-CM | POA: Diagnosis not present

## 2023-12-25 DIAGNOSIS — R21 Rash and other nonspecific skin eruption: Secondary | ICD-10-CM | POA: Diagnosis not present

## 2023-12-25 DIAGNOSIS — L0889 Other specified local infections of the skin and subcutaneous tissue: Secondary | ICD-10-CM | POA: Diagnosis not present

## 2023-12-26 ENCOUNTER — Ambulatory Visit: Admitting: Family Medicine

## 2023-12-26 ENCOUNTER — Encounter: Payer: Self-pay | Admitting: Family Medicine

## 2023-12-26 ENCOUNTER — Ambulatory Visit: Payer: Self-pay | Admitting: Family Medicine

## 2023-12-26 VITALS — BP 123/68 | HR 60 | Temp 97.4°F | Ht 65.5 in | Wt 189.2 lb

## 2023-12-26 DIAGNOSIS — E119 Type 2 diabetes mellitus without complications: Secondary | ICD-10-CM | POA: Diagnosis not present

## 2023-12-26 DIAGNOSIS — I10 Essential (primary) hypertension: Secondary | ICD-10-CM | POA: Diagnosis not present

## 2023-12-26 DIAGNOSIS — Z23 Encounter for immunization: Secondary | ICD-10-CM

## 2023-12-26 DIAGNOSIS — Z7984 Long term (current) use of oral hypoglycemic drugs: Secondary | ICD-10-CM

## 2023-12-26 DIAGNOSIS — R21 Rash and other nonspecific skin eruption: Secondary | ICD-10-CM | POA: Diagnosis not present

## 2023-12-26 DIAGNOSIS — I480 Paroxysmal atrial fibrillation: Secondary | ICD-10-CM

## 2023-12-26 DIAGNOSIS — E7800 Pure hypercholesterolemia, unspecified: Secondary | ICD-10-CM

## 2023-12-26 LAB — LIPID PANEL
Cholesterol: 95 mg/dL (ref 0–200)
HDL: 48.6 mg/dL (ref >39.00)
LDL Cholesterol: 25 mg/dL (ref 0–99)
NonHDL: 46.02
Total CHOL/HDL Ratio: 2
Triglycerides: 104 mg/dL (ref 0.0–149.0)
VLDL: 20.8 mg/dL (ref 0.0–40.0)

## 2023-12-26 LAB — POCT GLYCOSYLATED HEMOGLOBIN (HGB A1C)
HbA1c POC (<> result, manual entry): 7.6 % (ref 4.0–5.6)
HbA1c, POC (controlled diabetic range): 7.6 % — AB (ref 0.0–7.0)
HbA1c, POC (prediabetic range): 7.6 % — AB (ref 5.7–6.4)
Hemoglobin A1C: 7.6 % — AB (ref 4.0–5.6)

## 2023-12-26 LAB — BASIC METABOLIC PANEL WITH GFR
BUN: 24 mg/dL — ABNORMAL HIGH (ref 6–23)
CO2: 26 meq/L (ref 19–32)
Calcium: 9.9 mg/dL (ref 8.4–10.5)
Chloride: 101 meq/L (ref 96–112)
Creatinine, Ser: 0.95 mg/dL (ref 0.40–1.50)
GFR: 78.57 mL/min (ref >60.00)
Glucose, Bld: 135 mg/dL — ABNORMAL HIGH (ref 70–99)
Potassium: 3.6 meq/L (ref 3.5–5.1)
Sodium: 137 meq/L (ref 135–145)

## 2023-12-26 LAB — CBC WITH DIFFERENTIAL/PLATELET
Basophils Absolute: 0 K/uL (ref 0.0–0.1)
Basophils Relative: 0.7 % (ref 0.0–3.0)
Eosinophils Absolute: 0.2 K/uL (ref 0.0–0.7)
Eosinophils Relative: 2.5 % (ref 0.0–5.0)
HCT: 42.3 % (ref 39.0–52.0)
Hemoglobin: 14.3 g/dL (ref 13.0–17.0)
Lymphocytes Relative: 16.9 % (ref 12.0–46.0)
Lymphs Abs: 1.2 K/uL (ref 0.7–4.0)
MCHC: 33.8 g/dL (ref 30.0–36.0)
MCV: 91 fl (ref 78.0–100.0)
Monocytes Absolute: 0.9 K/uL (ref 0.1–1.0)
Monocytes Relative: 12.7 % — ABNORMAL HIGH (ref 3.0–12.0)
Neutro Abs: 4.7 K/uL (ref 1.4–7.7)
Neutrophils Relative %: 67.2 % (ref 43.0–77.0)
Platelets: 209 K/uL (ref 150.0–400.0)
RBC: 4.65 Mil/uL (ref 4.22–5.81)
RDW: 14.5 % (ref 11.5–15.5)
WBC: 7 K/uL (ref 4.0–10.5)

## 2023-12-26 LAB — MICROALBUMIN / CREATININE URINE RATIO
Creatinine,U: 88.2 mg/dL
Microalb Creat Ratio: UNDETERMINED mg/g (ref 0.0–30.0)
Microalb, Ur: <0.7 mg/dL

## 2023-12-26 MED ORDER — PIOGLITAZONE HCL 15 MG PO TABS: 15.0000 mg | ORAL_TABLET | Freq: Every day | ORAL | 1 refills | Status: DC

## 2023-12-26 NOTE — Progress Notes (Signed)
 OFFICE VISIT  12/26/2023  CC:  Chief Complaint  Patient presents with   Medical Management of Chronic Issues    Pt is fasting   Patient is a 73 y.o. male who presents for 45-month follow-up diabetes, hypertension, hypercholesterolemia, and PAF. A/P as of last visit: 1 diabetes without complication. Great control. Point-of-care hemoglobin A1c today is 6.5%. Continue Glucophage  XR 1000 mg daily.   #2 hypertension, well-controlled on HCTZ 25 mg a day. He takes 40 mill equivalents of Klor-Con  twice a day. Check basic metabolic panel today.   #3  Hypercholesterolemia, doing well on a atorvastatin  40 mg a day. Lipid panel and hepatic panel today.   #4 PAF. He does not feel any episodes of A-fib. He will continue on Eliquis  5 mg twice daily.  He has metoprolol  to use for as needed symptomatic A-fib. Check CBC today.  INTERIM HX: Diagnosed with viral bronchitis 12/18/2023, prescribed prednisone  20 mg a day x 5 days. He is feeling improved.  Does not have any fever, malaise, shortness of breath, or wheezing.  Of note, about the last 3 months he has had recurrent itchy papular rash that seems widespread.  He has seen the dermatologist multiple times.  Courses of prednisone  do help but when he stops them it comes back.  Most recently he was put on doxycycline  and a biopsy was done.  Results pending.  Currently on doxycycline  100 mg twice a day.  He does feel some intermittent lower abdominal bloating and has some altered bowel habits--> longstanding.  We have always attributed this to metformin .  It has improved a little bit since getting on the extended release and taking 1 in the morning and 1 in the evening. No blood in stool, no nausea.  Still with significant left knee pain.  This responds well to steroid injections by his orthopedist.  However, Michael Hodge is likely going to get knee replacement sometime in the next 6 to 9 months.  Feet: Mild tingling intermittently in the distal one  half of each foot.  Seems to be more prominent when he has not been active. No burning or numbness.  Past Medical History:  Diagnosis Date   Allergy Child   hay fever   Atrial fibrillation (HCC)    11/2021   CAD (coronary artery disease)    CT demonstrated nonobstructive plaques (this test was prompted by borderline ETT.   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    Diverticulosis    GERD (gastroesophageal reflux disease)    occ   Hepatic steatosis 07/2020   noted on CT abd/pelv done for GE illness   History of colitis 08/06/2020   presumed infectious. CT abd/pelv w/contrast at [facility]->long segment circumferential wall thickening of descending colon   History of nonmelanoma skin cancer    BCC   Hx of adenomatous colonic polyps    Hyperlipidemia    Hypertension    w/hypertensive retinopathy   Internal hemorrhoids    Obesity, Class II, BMI 35-39.9    Osteoarthritis of both knees    mod to severe 2022, steroid injections by ortho helped some.  Visco injections on the right --no significant help.   UTI (urinary tract infection) due to urinary indwelling catheter    05/2022 (e coli and enterococcus)    Past Surgical History:  Procedure Laterality Date  CARDIOVASCULAR STRESS TEST  02/2013   2015 LexiScan /low-level exercise-Myoview  (02/2013): No ischemia, EF 53%, occ symptomatic PVCs.  03/2022 NORMAL/LOW RISK   COLONOSCOPY  02/2021   2023 normal.  Recall 5 yrs   COLONOSCOPY W/ POLYPECTOMY  2005;08/21/10;11/29/15   adenomatous; also mild diverticulosis; recall 2017.  iFOB neg 09/2011.  11/2015: tubular adenoma x 1. 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 180 tablet 1   atorvastatin  (LIPITOR) 40 MG tablet Take 1 tablet (40 mg total) by mouth daily. 90 tablet 3   augmented betamethasone dipropionate (DIPROLENE-AF) 0.05 % cream Apply topically.     doxycycline  (MONODOX ) 100 MG capsule Take 100 mg by mouth. (Patient taking differently: Take 100 mg by mouth 2 (two) times daily.)     glucose blood (FREESTYLE LITE) test strip USE TO CHECK BLOOD SUGAR 1-2 TIMES DAILY AS INSTRUCTED. 100 each 6   hydrochlorothiazide  (HYDRODIURIL ) 25 MG tablet Take 1 tablet (25 mg total) by mouth daily. 90 tablet 3   metFORMIN  (GLUCOPHAGE -XR) 500 MG 24 hr tablet TAKE 2 TABLETS BY MOUTH DAILY WITH DINNER 180 tablet 3   metoprolol  tartrate (LOPRESSOR ) 25 MG tablet Take 1 tablet (25 mg total) by mouth as needed (racing heart beat). Take as needed for racing heart beat 60 tablet 1   potassium chloride  SA (KLOR-CON  M20) 20 MEQ tablet Take 2 tablets (40 mEq total) by mouth 2 (two) times daily. 360 tablet 3   sildenafil  (VIAGRA ) 100 MG tablet 1/2-1 tab po qd as needed.  Take 30-60 min prior to intercourse 15 tablet 6   diclofenac  Sodium (VOLTAREN ) 1 % GEL Apply 4 g topically daily as needed (pain). 100 g 1   No facility-administered medications prior to visit.    No Known Allergies  Review of Systems As per HPI  PE:    12/26/2023   10:35 AM 06/10/2023    8:37 AM 05/28/2023   10:31 AM  Vitals with BMI  Height 5' 5.5 5' 5.5 5' 6  Weight 189 lbs 3 oz 189 lbs 185 lbs 6 oz  BMI 30.99 30.96 29.94  Systolic 123 133 883  Diastolic 68 57 70  Pulse 60  50 62     Physical Exam  Gen: Alert, well appearing.  Patient is oriented to person, place, time, and situation. AFFECT: pleasant, lucid thought and speech. CV: RRR, no m/r/g.   LUNGS: CTA bilat, nonlabored resps, good aeration in all lung fields. EXT: no clubbing or cyanosis.  no edema.  Foot exam -  no swelling, tenderness or skin or vascular lesions. Color and temperature is normal. Sensation is intact. Peripheral pulses are palpable. Toenails are normal.  LABS:  Last CBC Lab Results  Component Value Date   WBC 5.4 06/10/2023   HGB 13.8 06/10/2023  HCT 41.0 06/10/2023   MCV 90.3 06/10/2023   MCH 29.4 05/17/2022   RDW 14.5 06/10/2023   PLT 177.0 06/10/2023   Last metabolic panel Lab Results  Component Value Date   GLUCOSE 111 (H) 06/10/2023   NA 139 06/10/2023   K 3.5 06/10/2023   CL 102 06/10/2023   CO2 28 06/10/2023   BUN 25 (H) 06/10/2023   CREATININE 0.95 06/10/2023   GFR 78.87 06/10/2023   CALCIUM  9.6 06/10/2023   PROT 6.4 06/10/2023   ALBUMIN 4.3 06/10/2023   BILITOT 1.0 06/10/2023   ALKPHOS 67 06/10/2023   AST 21 06/10/2023   ALT 17 06/10/2023   ANIONGAP 9 05/17/2022   Last lipids Lab Results  Component Value Date   CHOL 84 06/10/2023   HDL 40.60 06/10/2023   LDLCALC 28 06/10/2023   TRIG 81.0 06/10/2023   CHOLHDL 2 06/10/2023   Last hemoglobin A1c Lab Results  Component Value Date   HGBA1C 7.6 (A) 12/26/2023   HGBA1C 7.6 12/26/2023   HGBA1C 7.6 (A) 12/26/2023   HGBA1C 7.6 (A) 12/26/2023   IMPRESSION AND PLAN:  #1 diabetes without complication. Hemoglobin A1c is up to 7.6% today. Will keep dose of metformin  XR at 500 mg in the morning and in the evening.  I think a higher dose will make his gastrointestinal symptoms worse. Start pioglitazone 15 mg a day. Feet exam normal today. Urine microalbumin/creatinine and serum creatinine today.  2. hypertension, well-controlled on HCTZ 25 mg a day. He takes 40 mill equivalents of Klor-Con  twice a  day. Check basic metabolic panel today.   #3  Hypercholesterolemia, doing well on a atorvastatin  40 mg a day. Lipid panel and hepatic panel today.   #4 PAF. He does not feel any episodes of A-fib. He will continue on Eliquis  5 mg twice daily.  He has metoprolol  to use for as needed symptomatic A-fib. Check CBC today.  #5 rash of unknown etiology.  He is followed by dermatology. Currently on doxycycline  100 mg twice daily. The dermatologist requested that I check a CBC with differential today.  An After Visit Summary was printed and given to the patient.  FOLLOW UP: Return in about 6 months (around 06/25/2024) for routine chronic illness f/u. Next CPE April 2026 Signed:  Gerlene Hockey, MD           12/26/2023

## 2023-12-27 NOTE — Telephone Encounter (Signed)
 Labs routed

## 2023-12-31 ENCOUNTER — Encounter: Payer: Self-pay | Admitting: Family Medicine

## 2024-01-20 DIAGNOSIS — L309 Dermatitis, unspecified: Secondary | ICD-10-CM | POA: Diagnosis not present

## 2024-01-20 DIAGNOSIS — L570 Actinic keratosis: Secondary | ICD-10-CM | POA: Diagnosis not present

## 2024-01-20 DIAGNOSIS — Z85828 Personal history of other malignant neoplasm of skin: Secondary | ICD-10-CM | POA: Diagnosis not present

## 2024-01-20 DIAGNOSIS — L738 Other specified follicular disorders: Secondary | ICD-10-CM | POA: Diagnosis not present

## 2024-01-20 DIAGNOSIS — L821 Other seborrheic keratosis: Secondary | ICD-10-CM | POA: Diagnosis not present

## 2024-02-06 DIAGNOSIS — L2089 Other atopic dermatitis: Secondary | ICD-10-CM | POA: Diagnosis not present

## 2024-02-06 DIAGNOSIS — L309 Dermatitis, unspecified: Secondary | ICD-10-CM | POA: Diagnosis not present

## 2024-02-24 ENCOUNTER — Encounter: Payer: Self-pay | Admitting: Family Medicine

## 2024-02-24 NOTE — Telephone Encounter (Signed)
"  No further action needed at this time.  "

## 2024-02-28 ENCOUNTER — Telehealth (HOSPITAL_BASED_OUTPATIENT_CLINIC_OR_DEPARTMENT_OTHER): Payer: Self-pay

## 2024-02-28 ENCOUNTER — Telehealth: Payer: Self-pay

## 2024-02-28 NOTE — Telephone Encounter (Signed)
"  ° °  Pre-operative Risk Assessment    Patient Name: Michael Hodge.  DOB: Mar 06, 1950 MRN: [id number]   Date of last office visit: 05/28/23 with [facility] Date of next office visit: NA  Request for Surgical Clearance    Procedure:  Left Total Knee Arthroplasty   Date of Surgery:  Clearance TBD 04/2024                                 Surgeon:  Dr. Beverley Surgeon's Group or Practice Name:  [facility] Phone number:  [phone] Fax number:  [phone]   Type of Clearance Requested:   - Medical  - Pharmacy:  Hold Apixaban  (Eliquis ) not indicated   Type of Anesthesia:  Spinal   Additional requests/questions:    Bonney Augustin JONETTA Delores   02/28/2024, 8:38 AM   "

## 2024-02-28 NOTE — Telephone Encounter (Signed)
 Type of forms received: [facility] surgical clearance form  Routed to: [employer] [place]  Paperwork received by :  fax   Individual made aware of 5-7 business day turn around (Y/N): n/a  Form completed and patient made aware of charges(Y/N): n/a   Faxed to :   Form location: [place] inbox front office  **patient is scheduled for surgical clearance appt on 1/29 with Dr. Candise**

## 2024-03-03 NOTE — Telephone Encounter (Signed)
 Form on CMA desk until scheduled appt on 01/29

## 2024-03-06 ENCOUNTER — Other Ambulatory Visit: Payer: Self-pay | Admitting: Cardiology

## 2024-03-06 DIAGNOSIS — I480 Paroxysmal atrial fibrillation: Secondary | ICD-10-CM

## 2024-03-06 NOTE — Telephone Encounter (Signed)
 Patient with diagnosis of atrial fibrillation on Eliquis  for anticoagulation.    Procedure:  Left Total Knee Arthroplasty    Date of Surgery:  Clearance TBD 04/2024    CHA2DS2-VASc Score = 5   This indicates a 7.2% annual risk of stroke. The patient's score is based upon: CHF History: 0 HTN History: 1 Diabetes History: 1 Stroke History: 0 Vascular Disease History: 1 Age Score: 2 Gender Score: 0    CrCl 82 Platelet count 209  Patient  has not had an Afib/aflutter ablation in the last 3 months, DCCV within the last 4 weeks or a watchman implanted in the last 45 days   Per office protocol, patient can hold Eliquis  for 3 days prior to procedure.   Patient will not need bridging with Lovenox (enoxaparin) around procedure.  **This guidance is not considered finalized until pre-operative APP has relayed final recommendations.**

## 2024-03-06 NOTE — Telephone Encounter (Signed)
 Attempted to Called patient to schedule an office appointment for a pre-op clearance. No answer left a vm to call back.

## 2024-03-06 NOTE — Telephone Encounter (Signed)
" ° °  Name: Michael Hodge.  DOB: [birth year]  MRN: [id number]  Primary Cardiologist: Oneil Parchment, MD  Chart reviewed as part of pre-operative protocol coverage. Because of Michael LACKO Jr.'s past medical history and time since last visit, he will require a follow-up in-office visit in order to better assess preoperative cardiovascular risk.  Pre-op covering staff: - Please schedule appointment and call patient to inform them. If patient already had an upcoming appointment within acceptable timeframe, please add pre-op clearance to the appointment notes so provider is aware. - Please contact requesting surgeon's office via preferred method (i.e, phone, fax) to inform them of need for appointment prior to surgery.   Vetra Shinall, [place]  03/06/2024, 1:49 PM   "

## 2024-03-10 NOTE — Progress Notes (Deleted)
" °  Cardiology Office Note   Date:  03/10/2024  ID:  [name] [name], DOB [birth year], MRN [id number] PCP: Candise Aleene DEL, MD  [facility] Providers Cardiologist:  Oneil Parchment, MD Electrophysiologist:  Will Gladis Norton, MD { Click to update primary MD,subspecialty MD or APP then REFRESH:1}    History of Present Illness Michael Hodge. is a 74 y.o. male with a past medical history of PAF, elevated coronary calcium  score, HLD, HTN, type 2 DM. Patient presents today for a preop evaluation prior to total knee arthroplasty   Patient previously had a coronary CT scan in 2012 that showed a coronary calcium  score of 45 and mild, nonobstructive CAD. Later had an echocardiogram in 12/2021 that showed EF 55-60%, no regional wall motion abnormalities, grade I DD, normal RV systolic function, normal PA systolic pressure, no significant valvular abnormalities. Stress test in 03/2022 was a low risk study.   Patient has a known history of PAF. Has been on eliquis  and metoprolol  PRN for palpitations.   Preop Recommendations  -  - Per office protocol, patient can hold Eliquis  for 3 days prior to procedure.   Patient will not need bridging with Lovenox (enoxaparin) around procedure.  Coronary Artery Disease  - Coronary CT in 2012 showed mild nonobstructive disease. Stress test in 03/2022 was a normal, low risk study  -  - Continue lipitor 40 mg daily  - Continue eliquis  5 mg BID for afib  - Continue metoprolol  tartrate 25 mg BID   HTN  -  - Continue hydrochlorothiazide  25 mg daily and metoprolol  tartrate 25 mg BID  - K 3.6 and creatinine 0.95 in 11/2023   PAF  -  - Continue metoprolol  tartrate 25 mg BID  - Continue eliuis 5 mg BID. This is the correct dose for him  HLD  - Lipid panel from 11/2023 showed LDL 25, HDL 48, triglycerides 104, total cholesterol 95  - Continue lipitor 40 mg daily   ROS: ***  Studies Reviewed      *** Risk Assessment/Calculations {Does this  patient have ATRIAL FIBRILLATION?:[phone]} No BP recorded.  {Refresh Note OR Click here to enter BP  :1}***       Physical Exam VS:  There were no vitals taken for this visit.       Wt Readings from Last 3 Encounters:  12/26/23 189 lb 3.2 oz (85.8 kg)  06/10/23 189 lb (85.7 kg)  05/28/23 185 lb 6.4 oz (84.1 kg)    GEN: Well nourished, well developed in no acute distress NECK: No JVD; No carotid bruits CARDIAC: ***RRR, no murmurs, rubs, gallops RESPIRATORY:  Clear to auscultation without rales, wheezing or rhonchi  ABDOMEN: Soft, non-tender, non-distended EXTREMITIES:  No edema; No deformity   ASSESSMENT AND PLAN ***    {Are you ordering a CV Procedure (e.g. stress test, cath, DCCV, TEE, etc)?   Press F2        :[id number]}  Dispo: ***  Signed, Rollo FABIENE Louder, PA-C   "

## 2024-03-10 NOTE — Telephone Encounter (Signed)
"  Patient scheduled for 1.27.26  "

## 2024-03-20 NOTE — Progress Notes (Signed)
 " Cardiology Office Note   Date:  03/24/2024  ID:  Michael Churchill., DOB 1950/12/07, MRN [id number] PCP: Candise Aleene DEL, MD  [facility] Providers Cardiologist:  Oneil Parchment, MD Electrophysiologist:  Will Gladis Norton, MD     History of Present Illness Michael Corp. is a 74 y.o. male with history of elevated coronary calcium  score of 44.5, PAF, hypertension, hyperlipidemia, T2DM, and obesity.     He previously had a coronary CTA in 2012 with calcium  score of 44.5 and mild, non obstructive CAD. He later had an echo 12/2021 with LVEF 55-60%, no RWMA, grade I DD, RV normal, LA mildly dilated, and trivial MR.   Lexi stress test 04/16/2022 results; low risk stress test with no ischemia noted. He was last seen in office by Dr. Parchment and appeared to be doing well from a cardiac standpoint. It was noted that he had significant bruising and bleeding with minor injury that he attributes to Eliquis . Watchman was discussed and recommended by Dr. Parchment if bleeding issues persisted.     He presents today for pre operative clearance in the setting of left total knee arthroplasty.  He notes to be doing well from a cardiac standpoint.  He is active with quail hunting and goes up and down his stairs at home multiple times a day. He notes rarely checking his BP at home with SBP in one teens. He denies chest pain, shortness of breath, lower extremity edema, fatigue, palpitations, melena, hematuria, hemoptysis, diaphoresis, weakness, presyncope, syncope, orthopnea, and PND.  ROS: All systems negative unless otherwise indicated in HPI.   Studies Reviewed EKG Interpretation Date/Time:  Tuesday March 24 2024 13:53:21 EST Ventricular Rate:  58 PR Interval:  144 QRS Duration:  96 QT Interval:  436 QTC Calculation: 428 R Axis:   -29  Text Interpretation: Sinus bradycardia When compared with ECG of 28-May-2023 09:47, Premature ventricular complexes are no longer Present Confirmed by Michael Hodge [phone]) on 03/24/2024 1:59:13 PM    Cardiac Studies & Procedures   ______________________________________________________________________________________________   STRESS TESTS  MYOCARDIAL PERFUSION IMAGING 04/16/2022  Interpretation Summary   Resting ECG shows non specific ST abnormality in inferior leads   A pharmacological stress test was performed using IV Lexiscan  0.4mg  over 10 seconds performed without concurrent submaximal exercise. The patient reported dyspnea during the stress test. Normal blood pressure and normal heart rate response noted during stress. Heart rate recovery was normal.   No ST deviation from baseline was noted. Arrhythmias during stress: rare PVCs. Arrhythmias during recovery: rare PVCs. ECG was interpretable and without significant changes. The ECG was not diagnostic due to pharmacologic protocol.   There is no evidence of ischemia. There is no evidence of infarction.   Left ventricular function is normal. Nuclear stress EF: 50 %. The left ventricular ejection fraction is mildly decreased (45-54%). End diastolic cavity size is normal. End systolic cavity size is normal. No evidence of transient ischemic dilation (TID) noted.   The study is normal. The study is low risk.   Prior study available for comparison from 03/13/2013.   ECHOCARDIOGRAM  ECHOCARDIOGRAM COMPLETE 01/17/2022  Narrative ECHOCARDIOGRAM REPORT    Patient Name:   Michael Hodge Severe Date of Exam: 01/17/2022 Medical Rec #:  [id number]     Height:       66.0 in Accession #:    [id number]    Weight:       201.0 lb Date of Birth:  [birth year]  BSA:          2.004 m Patient Age:    71 years      BP:           120/70 mmHg Patient Gender: M             HR:           55 bpm. Exam Location:  [address]  Procedure: 2D Echo, Cardiac Doppler, Color Doppler and 3D Echo  Indications:    Atrial Fibrillation I48.91  History:        Patient has no prior history of Echocardiogram examinations. CAD;  Risk Factors:Diabetes, Hypertension and Dyslipidemia.  Sonographer:    Augustin Seals RDCS Referring Phys: [id number] MARK C SKAINS  IMPRESSIONS   1. Left ventricular ejection fraction, by estimation, is 55 to 60%. The left ventricle has normal function. The left ventricle has no regional wall motion abnormalities. Left ventricular diastolic parameters are consistent with Grade I diastolic dysfunction (impaired relaxation). 2. Right ventricular systolic function is normal. The right ventricular size is normal. There is normal pulmonary artery systolic pressure. The estimated right ventricular systolic pressure is 25.6 mmHg. 3. Left atrial size was mildly dilated. 4. The mitral valve is grossly normal. Trivial mitral valve regurgitation. 5. The aortic valve is tricuspid. Aortic valve regurgitation is not visualized. 6. The inferior vena cava is dilated in size with >50% respiratory variability, suggesting right atrial pressure of 8 mmHg.  Comparison(s): No prior Echocardiogram.  FINDINGS Left Ventricle: Left ventricular ejection fraction, by estimation, is 55 to 60%. The left ventricle has normal function. The left ventricle has no regional wall motion abnormalities. The left ventricular internal cavity size was normal in size. There is no left ventricular hypertrophy. Left ventricular diastolic parameters are consistent with Grade I diastolic dysfunction (impaired relaxation). Indeterminate filling pressures.  Right Ventricle: The right ventricular size is normal. No increase in right ventricular wall thickness. Right ventricular systolic function is normal. There is normal pulmonary artery systolic pressure. The tricuspid regurgitant velocity is 2.10 m/s, and with an assumed right atrial pressure of 8 mmHg, the estimated right ventricular systolic pressure is 25.6 mmHg.  Left Atrium: Left atrial size was mildly dilated.  Right Atrium: Right atrial size was normal in size.  Pericardium:  There is no evidence of pericardial effusion.  Mitral Valve: The mitral valve is grossly normal. Trivial mitral valve regurgitation.  Tricuspid Valve: The tricuspid valve is grossly normal. Tricuspid valve regurgitation is trivial.  Aortic Valve: The aortic valve is tricuspid. Aortic valve regurgitation is not visualized.  Pulmonic Valve: The pulmonic valve was normal in structure. Pulmonic valve regurgitation is not visualized.  Aorta: The aortic root and ascending aorta are structurally normal, with no evidence of dilitation.  Venous: The inferior vena cava is dilated in size with greater than 50% respiratory variability, suggesting right atrial pressure of 8 mmHg.  IAS/Shunts: No atrial level shunt detected by color flow Doppler.   LEFT VENTRICLE PLAX 2D LVIDd:         5.70 cm   Diastology LVIDs:         4.20 cm   LV Hodge' medial:    5.33 cm/s LV PW:         0.90 cm   LV Hodge/Hodge' medial:  14.9 LV IVS:        0.90 cm   LV Hodge' lateral:   9.36 cm/s LVOT diam:     2.30 cm   LV Hodge/Hodge' lateral: 8.5  LV SV:         84 LV SV Index:   42 LVOT Area:     4.15 cm  3D Volume EF: 3D EF:        52 % LV EDV:       169 ml LV ESV:       82 ml LV SV:        87 ml  RIGHT VENTRICLE RV Basal diam:  4.55 cm RV Mid diam:    3.00 cm RV S prime:     13.55 cm/s TAPSE (M-mode): 2.0 cm  LEFT ATRIUM             Index        RIGHT ATRIUM           Index LA diam:        4.40 cm 2.20 cm/m   RA Area:     21.40 cm LA Vol (A2C):   92.1 ml 45.96 ml/m  RA Volume:   62.00 ml  30.94 ml/m LA Vol (A4C):   49.5 ml 24.70 ml/m LA Biplane Vol: 72.5 ml 36.18 ml/m AORTIC VALVE LVOT Vmax:   87.05 cm/s LVOT Vmean:  56.875 cm/s LVOT VTI:    0.201 m  AORTA Ao Root diam: 3.70 cm Ao Asc diam:  3.60 cm  MITRAL VALVE               TRICUSPID VALVE MV Area (PHT): 3.14 cm    TR Peak grad:   17.6 mmHg MV Decel Time: 242 msec    TR Vmax:        210.00 cm/s MV Hodge velocity: 79.30 cm/s MV A velocity: 72.55 cm/s  SHUNTS MV  Hodge/A ratio:  1.09        Systemic VTI:  0.20 m Systemic Diam: 2.30 cm  Vinie Maxcy MD Electronically signed by Vinie Maxcy MD Signature Date/Time: 01/17/2022/11:24:57 AM    Final      CT SCANS  CT CORONARY MORPH W/CTA COR W/SCORE 08/17/2010  Narrative OVER-READ INTERPRETATION - CT CHEST  The following report is an over-read performed by radiologist Dr. Franky MATSU. Tracey, M.D. of [facility], [place] on 08/17/2010 17:19:52.  This over-read does not include interpretation of cardiac or coronary anatomy or pathology.  The CTA interpretation by the cardiologist is attached.  Comparison:  CT abdomen 03/03/2005  Findings: Minimal ground-glass densities dependently in the lungs, likely atelectasis.  There are no pleural effusions.  Visualized aorta is normal caliber.  No adenopathy in the visualized mediastinum or hila.  Imaging into the upper abdomen shows no acute findings.  No acute bony abnormality.  IMPRESSION: No significant extracardiac abnormality.  Cardiac CT:  Indication: Abnormal ETT and dyspnea  Protocol:  The patient was scanned on a Philips 256 scanner. Collimation was .9mm and gantry rotation speed .  The patient received 10mg  of iv lopresser and 2 sl nitro.  Average HR during scan was 54 bpm.  A prospectively triggered scan was done centered around 78% of the R-R interval.  The patient received 80cc of contrast.  The 3D data set was sent to a Philips work station for review using MPR, MIP and VRT modes  Findings:  Calcium  score  44.5 with discrete foci in the ostial RCA and proximal LAD at the take off of D1  Coronary CTA:  Right dominant with no anomaly.  LM- less than 20% calcific disease, LAD- less than 30% mixed plaque in the proximal segment, normal mid  and distal.  D1-large vessel with less than 30% calcific stenosis ostially., D2-normal, D3-small and normal. Cirucmflex-normal, OM1, OM2- normal, RCA - dominant and  normal.  Noncardiac: Soft tissue and lung windows reviewed with no significant findings.  See separate report from [facility]  Impression:  1)    Calcium  score 44.5  2)    Right dominant coronary arteries without significant stenosis.  See narrative above.  ASA and LDL goal under 100 given mixed plaque in proximal LAD and calcium  score Copy to Dr Lavona and Glendia Ferrier PA  Original Report Authenticated By: [id number]     ______________________________________________________________________________________________      Risk Assessment/Calculations  CHA2DS2-VASc Score = 5   This indicates a 7.2% annual risk of stroke. The patient's score is based upon: CHF History: 0 HTN History: 1 Diabetes History: 1 Stroke History: 0 Vascular Disease History: 1 Age Score: 2 Gender Score: 0            Physical Exam VS:  BP 118/60 (BP Location: Left Arm, Patient Position: Sitting, Cuff Size: Large)   Pulse 62   Resp 16   Ht 5' 5 (1.651 m)   Wt 191 lb 6.4 oz (86.8 kg)   SpO2 99%   BMI 31.85 kg/m        Wt Readings from Last 3 Encounters:  03/24/24 191 lb 6.4 oz (86.8 kg)  12/26/23 189 lb 3.2 oz (85.8 kg)  06/10/23 189 lb (85.7 kg)    GEN: Well nourished, well developed in no acute distress NECK: No JVD; No carotid bruits CARDIAC: RRR, no murmurs, rubs, gallops RESPIRATORY:  Clear to auscultation without rales, wheezing or rhonchi  ABDOMEN: Soft, non-tender, non-distended EXTREMITIES:  No edema; No deformity   ASSESSMENT AND PLAN  Pre op clearance- According to the Revised Cardiac Risk Index (RCRI), his Perioperative Risk of Major Cardiac Event is (%): 0.9 His Functional Capacity in METs is: 5.19 according to the Duke Activity Status Index (DASI). Per AHA/ACC guidelines, he is deemed acceptable risk for the planned procedure without additional cardiovascular testing. Will route to surgical team so they are aware.   Per office protocol, patient can hold Eliquis   for 3 days prior to procedure.   Patient will not need bridging with Lovenox (enoxaparin) around procedure.  Surgeon:  Dr. Beverley Surgeon's Group or Practice Name:  [facility] Fax number:  [phone]  Elevated coronary calcium  score- Coronary CTA in 2012 with calcium  score of 44.5 and mild, non obstructive CAD. Lexi stress test 04/16/2022 results; low risk stress test with no ischemia noted. -Stable with no anginal symptoms. No indication for ischemic evaluation.  -Not on ASA as patient is on Eliquis . -Continue atorvastatin  40 mg.   PAF- Today he denies palpitations, lightheadedness, and dizziness.  -EKG today SR. -Continue Eliquis  5 mg twice daily and metoprolol  titrate 25 mg PRN.  CHA2DS2-VASc Score = 5 [CHF History: 0, HTN History: 1, Diabetes History: 1, Stroke History: 0, Vascular Disease History: 1, Age Score: 2, Gender Score: 0].  Therefore, the patient's annual risk of stroke is 7.2 %.     Hypertension-BP today 118/60.  He reports taking BP at home regularly with SBP in one teens. -Discussed to monitor BP at home once weekly at least 2 hours after medications and sitting for 5-10 minutes. - Continue hydrochlorothiazide  25 mg - He is on 40 mEq of potassium. Last K was 3.6 on 12/26/2023.  PCP is rechecking K at visit next week.  HLD goal <70- Last LDL  25 on 12/26/23.  -Heart healthy diet and regular cardiovascular exercise encouraged.  -Continue atorvastatin  40 mg.  T2DM- Last A1C 7.6 12/26/23. Continue to follow with PCP.        Dispo: Follow-up with Dr. Jeffrie as scheduled.  Subjective to change.  Signed, Mardy KATHEE Pizza, FNP  "

## 2024-03-24 ENCOUNTER — Ambulatory Visit

## 2024-03-24 VITALS — BP 118/60 | HR 62 | Resp 16 | Ht 65.0 in | Wt 191.4 lb

## 2024-03-24 DIAGNOSIS — R931 Abnormal findings on diagnostic imaging of heart and coronary circulation: Secondary | ICD-10-CM | POA: Diagnosis not present

## 2024-03-24 DIAGNOSIS — I10 Essential (primary) hypertension: Secondary | ICD-10-CM

## 2024-03-24 DIAGNOSIS — E119 Type 2 diabetes mellitus without complications: Secondary | ICD-10-CM | POA: Diagnosis not present

## 2024-03-24 DIAGNOSIS — I480 Paroxysmal atrial fibrillation: Secondary | ICD-10-CM | POA: Diagnosis not present

## 2024-03-24 DIAGNOSIS — Z01818 Encounter for other preprocedural examination: Secondary | ICD-10-CM | POA: Diagnosis not present

## 2024-03-24 DIAGNOSIS — E785 Hyperlipidemia, unspecified: Secondary | ICD-10-CM | POA: Diagnosis not present

## 2024-03-24 NOTE — Patient Instructions (Signed)
 Medication Instructions:  Your physician recommends that you continue on your current medications as directed. Please refer to the Current Medication list given to you today.  *If you need a refill on your cardiac medications before your next appointment, please call your pharmacy*  Lab Work: Lab Orders  No laboratory test(s) ordered today    If you have labs (blood work) drawn today and your tests are completely normal, you will receive your results only by: MyChart Message (if you have MyChart) OR A paper copy in the mail If you have any lab test that is abnormal or we need to change your treatment, we will call you to review the results.   Follow-Up: At [facility], you and your health needs are our priority.  As part of our continuing mission to provide you with exceptional heart care, our providers are all part of one team.  This team includes your primary Cardiologist (physician) and Advanced Practice Providers or APPs (Physician Assistants and Nurse Practitioners) who all work together to provide you with the care you need, when you need it.  Your next appointment:   Keep appointment as scheduled for now. May be subject to change.   Provider:   Oneil Parchment, MD    We recommend signing up for the patient portal called MyChart.  Sign up information is provided on this After Visit Summary.  MyChart is used to connect with patients for Virtual Visits (Telemedicine).  Patients are able to view lab/test results, encounter notes, upcoming appointments, etc.  Non-urgent messages can be sent to your provider as well.   To learn more about what you can do with MyChart, go to [email or web address].

## 2024-03-25 ENCOUNTER — Telehealth: Payer: Self-pay

## 2024-03-25 NOTE — Telephone Encounter (Addendum)
 Called patient in regards to not needing to follow up next month with Dr. Jeffrie and that he could follow up in one year, we will send letter when patient can call and have appointment scheduled  ----- Message from Mardy KATHEE Pizza sent at 03/25/2024  9:02 AM EST ----- Regarding: Reschedule to one year folow up. I asked Dr. Jeffrie and he was okay with one year follow up. ----- Message ----- From: Jeffrie Oneil BROCKS, MD Sent: 03/24/2024   4:23 PM EST To: Mardy KATHEE Pizza, FNP  Okay to reschedule his appointment.  Thank you. ----- Message ----- From: Pizza Mardy KATHEE, FNP Sent: 03/24/2024   2:41 PM EST To: Oneil BROCKS Jeffrie, MD  He wants to have your blessing to reschedule his appointment on 04/16/24. I was going to have him follow up in 6 months. He is doing wonderful. 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

## 2024-03-26 ENCOUNTER — Encounter: Payer: Self-pay | Admitting: Family Medicine

## 2024-03-26 ENCOUNTER — Other Ambulatory Visit

## 2024-03-26 ENCOUNTER — Ambulatory Visit: Admitting: Family Medicine

## 2024-03-26 VITALS — BP 118/73 | HR 59 | Temp 96.7°F | Ht 65.0 in | Wt 190.0 lb

## 2024-03-26 DIAGNOSIS — Z7984 Long term (current) use of oral hypoglycemic drugs: Secondary | ICD-10-CM | POA: Diagnosis not present

## 2024-03-26 DIAGNOSIS — Z01818 Encounter for other preprocedural examination: Secondary | ICD-10-CM | POA: Diagnosis not present

## 2024-03-26 DIAGNOSIS — I10 Essential (primary) hypertension: Secondary | ICD-10-CM | POA: Diagnosis not present

## 2024-03-26 DIAGNOSIS — E7800 Pure hypercholesterolemia, unspecified: Secondary | ICD-10-CM | POA: Diagnosis not present

## 2024-03-26 DIAGNOSIS — E119 Type 2 diabetes mellitus without complications: Secondary | ICD-10-CM

## 2024-03-26 LAB — COMPREHENSIVE METABOLIC PANEL WITH GFR
ALT: 15 U/L (ref 3–53)
AST: 19 U/L (ref 5–37)
Albumin: 4.4 g/dL (ref 3.5–5.2)
Alkaline Phosphatase: 69 U/L (ref 39–117)
BUN: 25 mg/dL — ABNORMAL HIGH (ref 6–23)
CO2: 31 meq/L (ref 19–32)
Calcium: 10 mg/dL (ref 8.4–10.5)
Chloride: 101 meq/L (ref 96–112)
Creatinine, Ser: 1.02 mg/dL (ref 0.40–1.50)
GFR: 72.02 mL/min
Glucose, Bld: 108 mg/dL — ABNORMAL HIGH (ref 70–99)
Potassium: 3.6 meq/L (ref 3.5–5.1)
Sodium: 139 meq/L (ref 135–145)
Total Bilirubin: 0.8 mg/dL (ref 0.2–1.2)
Total Protein: 6.7 g/dL (ref 6.0–8.3)

## 2024-03-26 LAB — POCT GLYCOSYLATED HEMOGLOBIN (HGB A1C)
HbA1c POC (<> result, manual entry): 6.2 %
HbA1c, POC (controlled diabetic range): 6.2 % (ref 0.0–7.0)
HbA1c, POC (prediabetic range): 6.2 % (ref 5.7–6.4)
Hemoglobin A1C: 6.2 % — AB (ref 4.0–5.6)

## 2024-03-26 MED ORDER — PIOGLITAZONE HCL 30 MG PO TABS: 30.0000 mg | ORAL_TABLET | Freq: Every day | ORAL | 3 refills | Status: AC

## 2024-03-26 NOTE — Progress Notes (Signed)
 "     Office Note 03/26/2024  CC:  Chief Complaint  Patient presents with   Medical Management of Chronic Issues    HPI:  Patient is a 74 y.o. male who is here for 3 month follow-up chronic medical illness and for preoperative medical clearance for left total knee arthroplasty planned for [facility] 2026. I last saw him 12/26/23. A/P as of that visit: #1 diabetes without complication. Hemoglobin A1c is up to 7.6% today. Will keep dose of metformin  XR at 500 mg in the morning and in the evening.  I think a higher dose will make his gastrointestinal symptoms worse. Start pioglitazone  15 mg a day. Feet exam normal today. Urine microalbumin/creatinine and serum creatinine today.   2. hypertension, well-controlled on HCTZ 25 mg a day. He takes 40 mill equivalents of Klor-Con  twice a day. Check basic metabolic panel today.   #3  Hypercholesterolemia, doing well on a atorvastatin  40 mg a day. Lipid panel and hepatic panel today.   #4 PAF. He does not feel any episodes of A-fib. He will continue on Eliquis  5 mg twice daily.  He has metoprolol  to use for as needed symptomatic A-fib. Check CBC today.   #5 rash of unknown etiology.  He is followed by dermatology. Currently on doxycycline  100 mg twice daily. The dermatologist requested that I check a CBC with differential today.  INTERIM HX: Michael Hodge feels well.  He is gena get left total knee arthroplasty in a couple of months.  He continues to have some side effect with the metformin , though.  He often has fairly urgent postprandial diarrhea when he takes it. No abdominal pain or nausea.  He stays fairly active despite his chronic left knee pain.  He did get a cortisone injection by his orthopedist 02/24/2024.  This did help it.   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  Past Medical History:  Diagnosis Date   Atrial fibrillation (HCC)    11/2021   CAD (coronary artery disease)    CT demonstrated  nonobstructive plaques (this test was prompted by borderline ETT.   Diabetes mellitus without complication (HCC) dx'd 08/2011   Fasting gluc 136 and HbA1c 6.7% at diagnosis.    Diverticulosis    GERD (gastroesophageal reflux disease)    occ   Hay fever    Hepatic steatosis 07/2020   noted on CT abd/pelv done for GE illness   History of colitis 08/06/2020   presumed infectious. CT abd/pelv w/contrast at [facility]->long segment circumferential wall thickening of descending colon   History of nonmelanoma skin cancer    BCC and SCC   Hx of adenomatous colonic polyps    Hyperlipidemia    Hypertension    w/hypertensive retinopathy   Internal hemorrhoids    Obesity, Class II, BMI 35-39.9    Osteoarthritis of both knees    mod to severe 2022, steroid injections by ortho helped some.  Visco injections on the right --no significant help.   UTI (urinary tract infection) due to urinary indwelling catheter    05/2022 (e coli and enterococcus)    Past Surgical History:  Procedure Laterality Date   CARDIOVASCULAR STRESS TEST  02/2013   2015 LexiScan /low-level exercise-Myoview  (02/2013): No ischemia, EF 53%, occ symptomatic PVCs.  03/2022 NORMAL/LOW RISK   COLONOSCOPY  02/2021   2023 normal.  Recall 5 yrs   COLONOSCOPY W/ POLYPECTOMY  2005;08/21/10;11/29/15   adenomatous; also mild diverticulosis; recall 2017.  iFOB neg 09/2011.  11/2015: tubular adenoma x 1. 02/2021 normal->recall 37yrs   HAMMER TOE SURGERY Right 05/24/2020   MOHS SURGERY  2018   head, and 2021 on Lt cheek   remvoal sebaceous cyst  15-20 years ago   TONSILLECTOMY     as a child   TOTAL KNEE ARTHROPLASTY Right 05/29/2022   Procedure: TOTAL KNEE ARTHROPLASTY;  Surgeon: Beverley Evalene BIRCH, MD;  Location: [facility];  Service: Orthopedics;  Laterality: Right;   TRANSTHORACIC ECHOCARDIOGRAM     12/2021 EF 55-60%, grd I DD, mild LAE    Family History  Problem Relation Age of Onset   Heart attack Mother    Arthritis Mother     Diabetes Father    Stroke Father    Stroke Other    Colon cancer Neg Hx    Colon polyps Neg Hx    Rectal cancer Neg Hx    Stomach cancer Neg Hx    Esophageal cancer Neg Hx     Social History   Socioeconomic History   Marital status: Married    Spouse name: Not on file   Number of children: Not on file   Years of education: Not on file   Highest education level: Bachelor's degree (e.g., BA, AB, BS)  Occupational History   Not on file  Tobacco Use   Smoking status: Never   Smokeless tobacco: Never  Vaping Use   Vaping status: Never Used  Substance and Sexual Activity   Alcohol use: Yes    Alcohol/week: 3.0 standard drinks of alcohol    Types: 3 Shots of liquor per week    Comment: occ   Drug use: No   Sexual activity: Yes  Other Topics Concern   Not on file  Social History Narrative   Married x 40 yrs.   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   No T/A/Ds.   Social Drivers of Health   Tobacco Use: Low Risk (03/26/2024)   Patient History    Smoking Tobacco Use: Never    Smokeless Tobacco Use: Never    Passive Exposure: Not on file  Financial Resource Strain: Low Risk (12/25/2023)   Overall Financial Resource Strain (CARDIA)    Difficulty of Paying Living Expenses: Not hard at all  Food Insecurity: No Food Insecurity (12/25/2023)   Epic    Worried About Programme Researcher, Broadcasting/film/video in the Last Year: Never true    Ran Out of Food in the Last Year: Never true  Transportation Needs: No Transportation Needs (12/25/2023)   Epic    Lack of Transportation (Medical): No    Lack of Transportation (Non-Medical): No  Physical Activity: Insufficiently Active (12/25/2023)   Exercise Vital Sign    Days of Exercise per Week: 3 days    Minutes of Exercise per Session: 30 min  Stress: No Stress Concern Present (12/25/2023)   [employer] of Occupational Health - Occupational Stress Questionnaire    Feeling of Stress: Not at all  Social Connections:  Socially Integrated (12/25/2023)   Social Connection and Isolation Panel    Frequency of Communication with Friends and Family: More than three times a week    Frequency of Social Gatherings with Friends and Family: More than three times a week    Attends Religious Services: More than 4 times per year    Active Member of [employer] or Organizations: Yes    Attends Banker Meetings: More than 4 times per year    Marital Status:  Married  Intimate Partner Violence: Not At Risk (05/15/2023)   Humiliation, Afraid, Rape, and Kick questionnaire    Fear of Current or Ex-Partner: No    Emotionally Abused: No    Physically Abused: No    Sexually Abused: No  Depression (PHQ2-9): Low Risk (03/26/2024)   Depression (PHQ2-9)    PHQ-2 Score: 0  Alcohol Screen: Low Risk (12/25/2023)   Alcohol Screen    Last Alcohol Screening Score (AUDIT): 2  Housing: Unknown (12/25/2023)   Epic    Unable to Pay for Housing in the Last Year: Not on file    Number of Times Moved in the Last Year: 0    Homeless in the Last Year: No  Utilities: Not At Risk (05/15/2023)   AHC Utilities    Threatened with loss of utilities: No  Health Literacy: Adequate Health Literacy (05/15/2023)   B1300 Health Literacy    Frequency of need for help with medical instructions: Never    Outpatient Medications Prior to Visit  Medication Sig Dispense Refill   atorvastatin  (LIPITOR) 40 MG tablet Take 1 tablet (40 mg total) by mouth daily. 90 tablet 3   augmented betamethasone dipropionate (DIPROLENE-AF) 0.05 % cream Apply topically.     ELIQUIS  5 MG TABS tablet TAKE 1 TABLET BY MOUTH 2 TIMES A DAY 180 tablet 1   glucose blood (FREESTYLE LITE) test strip USE TO CHECK BLOOD SUGAR 1-2 TIMES DAILY AS INSTRUCTED. 100 each 6   hydrochlorothiazide  (HYDRODIURIL ) 25 MG tablet Take 1 tablet (25 mg total) by mouth daily. 90 tablet 3   potassium chloride  SA (KLOR-CON  M20) 20 MEQ tablet Take 2 tablets (40 mEq total) by mouth 2 (two) times  daily. 360 tablet 3   sildenafil  (VIAGRA ) 100 MG tablet 1/2-1 tab po qd as needed.  Take 30-60 min prior to intercourse 15 tablet 6   metFORMIN  (GLUCOPHAGE -XR) 500 MG 24 hr tablet TAKE 2 TABLETS BY MOUTH DAILY WITH DINNER 180 tablet 3   pioglitazone  (ACTOS ) 15 MG tablet Take 1 tablet (15 mg total) by mouth daily. 90 tablet 1   metoprolol  tartrate (LOPRESSOR ) 25 MG tablet Take 1 tablet (25 mg total) by mouth as needed (racing heart beat). Take as needed for racing heart beat (Patient not taking: Reported on 03/24/2024) 60 tablet 1   No facility-administered medications prior to visit.    Allergies[1]  Review of Systems  Constitutional:  Negative for appetite change, chills, fatigue and fever.  HENT:  Negative for congestion, dental problem, ear pain and sore throat.   Eyes:  Negative for discharge, redness and visual disturbance.  Respiratory:  Negative for cough, chest tightness, shortness of breath and wheezing.   Cardiovascular:  Negative for chest pain, palpitations and leg swelling.  Gastrointestinal:  Positive for diarrhea. Negative for abdominal pain, blood in stool, nausea and vomiting.  Genitourinary:  Negative for difficulty urinating, dysuria, flank pain, frequency, hematuria and urgency.  Musculoskeletal:  Negative for arthralgias, back pain, joint swelling, myalgias and neck stiffness.  Skin:  Negative for pallor and rash.  Neurological:  Negative for dizziness, speech difficulty, weakness and headaches.  Hematological:  Negative for adenopathy. Does not bruise/bleed easily.  Psychiatric/Behavioral:  Negative for confusion and sleep disturbance. The patient is not nervous/anxious.     PE;    03/26/2024    8:35 AM 03/24/2024    1:56 PM 12/26/2023   10:35 AM  Vitals with BMI  Height 5' 5 5' 5 5' 5.5  Weight 190 lbs 191 lbs  6 oz 189 lbs 3 oz  BMI 31.62 31.85 30.99  Systolic 118 118 876  Diastolic 73 60 68  Pulse 59 62 60    Gen: Alert, well appearing.  Patient is  oriented to person, place, time, and situation. AFFECT: pleasant, lucid thought and speech. Neck: no bruits CV: RRR, no m/r/g.   LUNGS: CTA bilat, nonlabored resps, good aeration in all lung fields. EXT: no clubbing or cyanosis.  no edema.   Pertinent labs:  Lab Results  Component Value Date   TSH 3.35 12/22/2021   Lab Results  Component Value Date   WBC 7.0 12/26/2023   HGB 14.3 12/26/2023   HCT 42.3 12/26/2023   MCV 91.0 12/26/2023   PLT 209.0 12/26/2023   Lab Results  Component Value Date   CREATININE 0.95 12/26/2023   BUN 24 (H) 12/26/2023   NA 137 12/26/2023   K 3.6 12/26/2023   CL 101 12/26/2023   CO2 26 12/26/2023   Lab Results  Component Value Date   ALT 17 06/10/2023   AST 21 06/10/2023   ALKPHOS 67 06/10/2023   BILITOT 1.0 06/10/2023   Lab Results  Component Value Date   CHOL 95 12/26/2023   Lab Results  Component Value Date   HDL 48.60 12/26/2023   Lab Results  Component Value Date   LDLCALC 25 12/26/2023   Lab Results  Component Value Date   TRIG 104.0 12/26/2023   Lab Results  Component Value Date   CHOLHDL 2 12/26/2023   Lab Results  Component Value Date   PSA 1.15 06/10/2023   PSA 0.80 05/24/2022   PSA 0.68 06/22/2021   Lab Results  Component Value Date   HGBA1C 6.2 (A) 03/26/2024   HGBA1C 6.2 03/26/2024   HGBA1C 6.2 03/26/2024   HGBA1C 6.2 03/26/2024   ASSESSMENT AND PLAN:   #1 diabetes without complication. Hemoglobin A1c significantly improved today to 6.2%. He is not tolerating metformin  so we will discontinue this medication completely. Will increase his pioglitazone  to 30 mg a day.   2. hypertension, well-controlled on HCTZ 25 mg a day. He takes 40 mill equivalents of Klor-Con  twice a day. Check basic metabolic panel today.   #3  Hypercholesterolemia, doing well on a atorvastatin  40 mg a day. LDL cholesterol was 25 approximately 3 months ago. Checking hepatic panel today. Next lipid panel in 3 months.   #4  PAF. He does not feel any episodes of A-fib. He will continue on Eliquis  5 mg twice daily.  He has metoprolol  to use for as needed symptomatic A-fib. He just recently had follow-up/preoperative clearance with cardiology and all was good.  Medical clearance form for upcoming left total knee arthroplasty signed today, low risk.  An After Visit Summary was printed and given to the patient.  FOLLOW UP:  Return in about 3 months (around 06/24/2024) for routine chronic illness f/u.  Signed:  Gerlene Hockey, MD           03/26/2024     [1] No Known Allergies  "

## 2024-03-27 ENCOUNTER — Ambulatory Visit: Payer: Self-pay | Admitting: Family Medicine

## 2024-04-16 ENCOUNTER — Ambulatory Visit: Admitting: Cardiology

## 2024-05-27 ENCOUNTER — Encounter

## 2024-06-24 ENCOUNTER — Ambulatory Visit: Admitting: Family Medicine

## 2024-06-25 ENCOUNTER — Ambulatory Visit: Admitting: Family Medicine
